# Patient Record
Sex: Female | Born: 1940
Health system: Southern US, Community
[De-identification: ages and names within clinical notes are randomized; demographics above are authoritative.]

## PROBLEM LIST (undated history)

## (undated) DIAGNOSIS — I739 Peripheral vascular disease, unspecified: Secondary | ICD-10-CM

## (undated) DIAGNOSIS — M2540 Effusion, unspecified joint: Secondary | ICD-10-CM

## (undated) DIAGNOSIS — M810 Age-related osteoporosis without current pathological fracture: Secondary | ICD-10-CM

## (undated) DIAGNOSIS — M549 Dorsalgia, unspecified: Secondary | ICD-10-CM

## (undated) DIAGNOSIS — R351 Nocturia: Secondary | ICD-10-CM

## (undated) DIAGNOSIS — M2550 Pain in unspecified joint: Secondary | ICD-10-CM

## (undated) DIAGNOSIS — I10 Essential (primary) hypertension: Secondary | ICD-10-CM

## (undated) HISTORY — PX: OTHER SURGICAL HISTORY: SHX169

## (undated) HISTORY — DX: Essential (primary) hypertension: I10

## (undated) HISTORY — PX: TRIGGER FINGER RELEASE: SHX641

---

## 2000-02-11 ENCOUNTER — Other Ambulatory Visit: Admission: RE | Admit: 2000-02-11 | Discharge: 2000-02-11 | Payer: Self-pay | Admitting: Orthopedic Surgery

## 2000-07-29 ENCOUNTER — Ambulatory Visit (HOSPITAL_COMMUNITY): Admission: RE | Admit: 2000-07-29 | Discharge: 2000-07-29 | Payer: Self-pay | Admitting: Internal Medicine

## 2000-07-29 ENCOUNTER — Encounter: Payer: Self-pay | Admitting: Internal Medicine

## 2001-02-03 ENCOUNTER — Ambulatory Visit (HOSPITAL_COMMUNITY): Admission: RE | Admit: 2001-02-03 | Discharge: 2001-02-03 | Payer: Self-pay | Admitting: Internal Medicine

## 2001-02-03 ENCOUNTER — Encounter: Payer: Self-pay | Admitting: Internal Medicine

## 2002-03-28 ENCOUNTER — Encounter: Payer: Self-pay | Admitting: Internal Medicine

## 2002-03-28 ENCOUNTER — Ambulatory Visit (HOSPITAL_COMMUNITY): Admission: RE | Admit: 2002-03-28 | Discharge: 2002-03-28 | Payer: Self-pay | Admitting: Internal Medicine

## 2002-09-28 ENCOUNTER — Encounter: Payer: Self-pay | Admitting: Emergency Medicine

## 2002-09-28 ENCOUNTER — Emergency Department (HOSPITAL_COMMUNITY): Admission: EM | Admit: 2002-09-28 | Discharge: 2002-09-28 | Payer: Self-pay | Admitting: Emergency Medicine

## 2003-04-21 ENCOUNTER — Ambulatory Visit (HOSPITAL_COMMUNITY): Admission: RE | Admit: 2003-04-21 | Discharge: 2003-04-21 | Payer: Self-pay | Admitting: Internal Medicine

## 2003-06-22 ENCOUNTER — Emergency Department (HOSPITAL_COMMUNITY): Admission: AD | Admit: 2003-06-22 | Discharge: 2003-06-22 | Payer: Self-pay | Admitting: Emergency Medicine

## 2003-06-22 ENCOUNTER — Emergency Department (HOSPITAL_COMMUNITY): Admission: EM | Admit: 2003-06-22 | Discharge: 2003-06-22 | Payer: Self-pay | Admitting: Emergency Medicine

## 2003-07-27 ENCOUNTER — Ambulatory Visit (HOSPITAL_COMMUNITY): Admission: RE | Admit: 2003-07-27 | Discharge: 2003-07-27 | Payer: Self-pay

## 2005-02-25 ENCOUNTER — Ambulatory Visit (HOSPITAL_COMMUNITY): Admission: RE | Admit: 2005-02-25 | Discharge: 2005-02-25 | Payer: Self-pay | Admitting: Internal Medicine

## 2005-04-28 ENCOUNTER — Ambulatory Visit (HOSPITAL_COMMUNITY): Admission: RE | Admit: 2005-04-28 | Discharge: 2005-04-28 | Payer: Self-pay | Admitting: Internal Medicine

## 2006-03-12 ENCOUNTER — Ambulatory Visit (HOSPITAL_COMMUNITY): Admission: RE | Admit: 2006-03-12 | Discharge: 2006-03-12 | Payer: Self-pay | Admitting: Internal Medicine

## 2006-07-21 ENCOUNTER — Ambulatory Visit: Payer: Self-pay | Admitting: Internal Medicine

## 2006-07-21 ENCOUNTER — Ambulatory Visit (HOSPITAL_COMMUNITY): Admission: RE | Admit: 2006-07-21 | Discharge: 2006-07-21 | Payer: Self-pay | Admitting: Internal Medicine

## 2007-03-18 ENCOUNTER — Ambulatory Visit (HOSPITAL_COMMUNITY): Admission: RE | Admit: 2007-03-18 | Discharge: 2007-03-18 | Payer: Self-pay | Admitting: Internal Medicine

## 2008-05-05 ENCOUNTER — Ambulatory Visit (HOSPITAL_COMMUNITY): Admission: RE | Admit: 2008-05-05 | Discharge: 2008-05-05 | Payer: Self-pay | Admitting: Internal Medicine

## 2009-05-07 ENCOUNTER — Ambulatory Visit (HOSPITAL_COMMUNITY): Admission: RE | Admit: 2009-05-07 | Discharge: 2009-05-07 | Payer: Self-pay | Admitting: Internal Medicine

## 2010-05-22 ENCOUNTER — Other Ambulatory Visit (HOSPITAL_COMMUNITY): Payer: Self-pay | Admitting: Internal Medicine

## 2010-05-22 DIAGNOSIS — Z139 Encounter for screening, unspecified: Secondary | ICD-10-CM

## 2010-05-30 ENCOUNTER — Ambulatory Visit (HOSPITAL_COMMUNITY)
Admission: RE | Admit: 2010-05-30 | Discharge: 2010-05-30 | Disposition: A | Discharge status: Home / Self Care | Payer: Medicare Other | Source: Ambulatory Visit | Attending: Internal Medicine | Admitting: Internal Medicine

## 2010-05-30 DIAGNOSIS — Z139 Encounter for screening, unspecified: Secondary | ICD-10-CM

## 2010-05-30 DIAGNOSIS — Z1231 Encounter for screening mammogram for malignant neoplasm of breast: Secondary | ICD-10-CM | POA: Insufficient documentation

## 2010-08-02 ENCOUNTER — Other Ambulatory Visit (HOSPITAL_COMMUNITY): Payer: Self-pay | Admitting: Internal Medicine

## 2010-08-05 ENCOUNTER — Ambulatory Visit (HOSPITAL_COMMUNITY)
Admission: RE | Admit: 2010-08-05 | Discharge: 2010-08-05 | Disposition: A | Discharge status: Home / Self Care | Payer: Medicare Other | Source: Ambulatory Visit | Attending: Internal Medicine | Admitting: Internal Medicine

## 2010-08-05 ENCOUNTER — Encounter (HOSPITAL_COMMUNITY): Payer: Self-pay

## 2010-08-05 DIAGNOSIS — M810 Age-related osteoporosis without current pathological fracture: Secondary | ICD-10-CM | POA: Insufficient documentation

## 2010-08-30 NOTE — Op Note (Signed)
NAME:  Paige Glass, Paige Glass                ACCOUNT NO.:  [id number]   MEDICAL RECORD NO.:  [id number]          PATIENT TYPE:  AMB   LOCATION:  DAY                           FACILITY:  [facility]   PHYSICIAN:  R. Roetta Sessions, M.D. DATE OF BIRTH:  01-14-1941   DATE OF PROCEDURE:  07/21/2006  DATE OF DISCHARGE:                               OPERATIVE REPORT   PROCEDURE:  Screening colonoscopy.   INDICATIONS FOR PROCEDURE:  The patient is a 70 year old lady, devoid of  any lower GI tract symptoms, sent over by Dr. Ouida Sills for colorectal  cancer screening.  She reports her last colonoscopy was in 1997.  Apparently, she had a sigmoidoscopy preceding that exam which suggested  polyps.  Reportedly, Dr. Lovell Sheehan do not find  any polyps.  There is no  family history of colorectal neoplasia.  Colonoscopy is now being done  as standard screening maneuver.  This approach has been discussed with  the patient at length and potential risks, benefits, and alternatives  have been reviewed. Questions have been answered.  She is agreeable.  Please see documentation in the medical record.   PROCEDURE NOTE:  O2 saturation, blood pressure, pulse, and respirations  were monitored throughout the entire procedure.   CONSCIOUS SEDATION:  Versed 3 mg IV and Demerol 50 mg IV in divided  doses.   INSTRUMENT:  Pentax video chip system.   FINDINGS:  Digital rectal exam revealed no abnormalities.   ENDOSCOPIC FINDINGS:  The prep was good.  Examination of the colonic  mucosa was undertaken from the rectosigmoid junction through the left  transverse, right colon, appendiceal orifice, ileocecal valve and cecum.  These structures were well seen and photographed for the record.  From  this level, the scope was slowly withdrawn.  All previously mentioned  mucosal surfaces were again seen.  The colonic mucosa appeared normal.  Scope was pulled down into the rectum.  A thorough examination of the  rectal mucosa was carried out  including  retroflexed view of the anal  verge which demonstrated no abnormalities.  The patient tolerated the  procedure well.   IMPRESSION:  1. Normal rectum.  2. Normal colon.   RECOMMENDATIONS:  Would consider another colonoscopy for screening  purposes in 10 years.      Jonathon Bellows, M.D.  Electronically Signed     RMR/MEDQ  D:  07/21/2006  T:  07/21/2006  Job:  [id number]   cc:   Kingsley Callander. Ouida Sills, MD  Fax: [phone]

## 2011-07-23 ENCOUNTER — Other Ambulatory Visit (HOSPITAL_COMMUNITY): Payer: Self-pay | Admitting: Internal Medicine

## 2011-07-23 DIAGNOSIS — Z139 Encounter for screening, unspecified: Secondary | ICD-10-CM

## 2011-07-28 ENCOUNTER — Ambulatory Visit (HOSPITAL_COMMUNITY)
Admission: RE | Admit: 2011-07-28 | Discharge: 2011-07-28 | Disposition: A | Discharge status: Home / Self Care | Payer: Medicare Other | Source: Ambulatory Visit | Attending: Internal Medicine | Admitting: Internal Medicine

## 2011-07-28 DIAGNOSIS — Z139 Encounter for screening, unspecified: Secondary | ICD-10-CM

## 2011-07-28 DIAGNOSIS — Z1231 Encounter for screening mammogram for malignant neoplasm of breast: Secondary | ICD-10-CM | POA: Insufficient documentation

## 2012-08-02 ENCOUNTER — Other Ambulatory Visit (HOSPITAL_COMMUNITY): Payer: Self-pay | Admitting: Internal Medicine

## 2012-08-02 DIAGNOSIS — Z139 Encounter for screening, unspecified: Secondary | ICD-10-CM

## 2012-08-12 ENCOUNTER — Ambulatory Visit (HOSPITAL_COMMUNITY)
Admission: RE | Admit: 2012-08-12 | Discharge: 2012-08-12 | Disposition: A | Discharge status: Home / Self Care | Payer: Medicare Other | Source: Ambulatory Visit | Attending: Internal Medicine | Admitting: Internal Medicine

## 2012-08-12 DIAGNOSIS — Z139 Encounter for screening, unspecified: Secondary | ICD-10-CM

## 2012-08-12 DIAGNOSIS — Z1231 Encounter for screening mammogram for malignant neoplasm of breast: Secondary | ICD-10-CM | POA: Insufficient documentation

## 2013-09-15 ENCOUNTER — Other Ambulatory Visit (HOSPITAL_COMMUNITY): Payer: Self-pay | Admitting: Internal Medicine

## 2013-09-15 DIAGNOSIS — Z1231 Encounter for screening mammogram for malignant neoplasm of breast: Secondary | ICD-10-CM

## 2013-09-20 ENCOUNTER — Ambulatory Visit (HOSPITAL_COMMUNITY)
Admission: RE | Admit: 2013-09-20 | Discharge: 2013-09-20 | Disposition: A | Discharge status: Home / Self Care | Payer: Medicare Other | Source: Ambulatory Visit | Attending: Internal Medicine | Admitting: Internal Medicine

## 2013-09-20 DIAGNOSIS — Z1231 Encounter for screening mammogram for malignant neoplasm of breast: Secondary | ICD-10-CM | POA: Insufficient documentation

## 2013-09-26 ENCOUNTER — Other Ambulatory Visit (HOSPITAL_COMMUNITY): Payer: Self-pay | Admitting: Internal Medicine

## 2013-09-26 DIAGNOSIS — M810 Age-related osteoporosis without current pathological fracture: Secondary | ICD-10-CM

## 2013-09-29 ENCOUNTER — Ambulatory Visit (HOSPITAL_COMMUNITY)
Admission: RE | Admit: 2013-09-29 | Discharge: 2013-09-29 | Disposition: A | Discharge status: Home / Self Care | Payer: Medicare Other | Source: Ambulatory Visit | Attending: Internal Medicine | Admitting: Internal Medicine

## 2013-09-29 DIAGNOSIS — M810 Age-related osteoporosis without current pathological fracture: Secondary | ICD-10-CM | POA: Insufficient documentation

## 2014-10-18 ENCOUNTER — Other Ambulatory Visit (HOSPITAL_COMMUNITY): Payer: Self-pay | Admitting: Internal Medicine

## 2014-10-18 DIAGNOSIS — Z1231 Encounter for screening mammogram for malignant neoplasm of breast: Secondary | ICD-10-CM

## 2014-10-25 ENCOUNTER — Ambulatory Visit (HOSPITAL_COMMUNITY)
Admission: RE | Admit: 2014-10-25 | Discharge: 2014-10-25 | Disposition: A | Discharge status: Home / Self Care | Payer: Medicare Other | Source: Ambulatory Visit | Attending: Internal Medicine | Admitting: Internal Medicine

## 2014-10-25 DIAGNOSIS — Z1231 Encounter for screening mammogram for malignant neoplasm of breast: Secondary | ICD-10-CM | POA: Diagnosis present

## 2015-10-19 ENCOUNTER — Other Ambulatory Visit (HOSPITAL_COMMUNITY): Payer: Self-pay | Admitting: Internal Medicine

## 2015-10-19 DIAGNOSIS — Z1231 Encounter for screening mammogram for malignant neoplasm of breast: Secondary | ICD-10-CM

## 2015-10-24 DIAGNOSIS — M810 Age-related osteoporosis without current pathological fracture: Secondary | ICD-10-CM | POA: Diagnosis not present

## 2015-10-24 DIAGNOSIS — Z79899 Other long term (current) drug therapy: Secondary | ICD-10-CM | POA: Diagnosis not present

## 2015-10-29 ENCOUNTER — Ambulatory Visit (HOSPITAL_COMMUNITY)
Admission: RE | Admit: 2015-10-29 | Discharge: 2015-10-29 | Disposition: A | Discharge status: Home / Self Care | Payer: PPO | Source: Ambulatory Visit | Attending: Internal Medicine | Admitting: Internal Medicine

## 2015-10-29 DIAGNOSIS — Z1231 Encounter for screening mammogram for malignant neoplasm of breast: Secondary | ICD-10-CM | POA: Insufficient documentation

## 2015-11-01 DIAGNOSIS — Z6824 Body mass index (BMI) 24.0-24.9, adult: Secondary | ICD-10-CM | POA: Diagnosis not present

## 2015-11-01 DIAGNOSIS — D45 Polycythemia vera: Secondary | ICD-10-CM | POA: Diagnosis not present

## 2015-11-01 DIAGNOSIS — M810 Age-related osteoporosis without current pathological fracture: Secondary | ICD-10-CM | POA: Diagnosis not present

## 2015-11-01 DIAGNOSIS — F17210 Nicotine dependence, cigarettes, uncomplicated: Secondary | ICD-10-CM | POA: Diagnosis not present

## 2015-11-06 ENCOUNTER — Other Ambulatory Visit (HOSPITAL_COMMUNITY): Payer: Self-pay | Admitting: Internal Medicine

## 2015-11-06 DIAGNOSIS — Z780 Asymptomatic menopausal state: Secondary | ICD-10-CM

## 2015-11-06 DIAGNOSIS — M810 Age-related osteoporosis without current pathological fracture: Secondary | ICD-10-CM

## 2015-11-12 ENCOUNTER — Ambulatory Visit (HOSPITAL_COMMUNITY)
Admission: RE | Admit: 2015-11-12 | Discharge: 2015-11-12 | Disposition: A | Discharge status: Home / Self Care | Payer: PPO | Source: Ambulatory Visit | Attending: Internal Medicine | Admitting: Internal Medicine

## 2015-11-12 DIAGNOSIS — M810 Age-related osteoporosis without current pathological fracture: Secondary | ICD-10-CM | POA: Diagnosis not present

## 2015-11-12 DIAGNOSIS — Z780 Asymptomatic menopausal state: Secondary | ICD-10-CM

## 2016-06-17 ENCOUNTER — Encounter: Payer: Self-pay | Admitting: Internal Medicine

## 2016-07-13 ENCOUNTER — Ambulatory Visit (HOSPITAL_COMMUNITY)
Admission: EM | Admit: 2016-07-13 | Discharge: 2016-07-13 | Disposition: A | Discharge status: Home / Self Care | Payer: PPO | Attending: Family Medicine | Admitting: Family Medicine

## 2016-07-13 ENCOUNTER — Ambulatory Visit (INDEPENDENT_AMBULATORY_CARE_PROVIDER_SITE_OTHER): Payer: PPO

## 2016-07-13 ENCOUNTER — Encounter (HOSPITAL_COMMUNITY): Payer: Self-pay | Admitting: Emergency Medicine

## 2016-07-13 DIAGNOSIS — S92001A Unspecified fracture of right calcaneus, initial encounter for closed fracture: Secondary | ICD-10-CM

## 2016-07-13 DIAGNOSIS — S92002A Unspecified fracture of left calcaneus, initial encounter for closed fracture: Secondary | ICD-10-CM | POA: Diagnosis not present

## 2016-07-13 MED ORDER — HYDROCODONE-ACETAMINOPHEN 5-325 MG PO TABS: 1.0000 | ORAL_TABLET | Freq: Four times a day (QID) | ORAL | 0 refills | Status: DC | PRN

## 2016-07-13 NOTE — Progress Notes (Signed)
Orthopedic Tech Progress Note Patient Details:  Alishah Schulte Spanos Apr 11, 1941 [id number]  Ortho Devices Type of Ortho Device: Ace wrap, Stirrup splint Ortho Device/Splint Location: LLE Ortho Device/Splint Interventions: Ordered, Application   Braulio Bosch 07/13/2016, 8:48 PM

## 2016-07-13 NOTE — ED Triage Notes (Signed)
PT thought she left something hot on so she was rushing down the stairs and fell down two stairs. PT fell onto cement floor. PT did not hit her head. Pain is located behind left heel/ankle. No other injuries. Pt fell at 0745 this morning

## 2016-07-13 NOTE — ED Provider Notes (Addendum)
Paige Glass    CSN: [id number] Arrival date & time: 07/13/16  Paige Glass     History   Chief Complaint Chief Complaint  Patient presents with  . Fall    HPI Paige Glass is a 76 y.o. female.   This a 76 year old woman that fell down this morning when she was walking down the stairs. She struck her ankle and has had swelling and pain since. She is brought to the emergency room by her daughter and granddaughter.      History reviewed. No pertinent past medical history.  There are no active problems to display for this patient.   History reviewed. No pertinent surgical history.  OB History    No data available       Home Medications    Prior to Admission medications   Not on File    Family History No family history on file.  Social History Social History  Substance Use Topics  . Smoking status: Current Every Day Smoker    Packs/day: 0.50    Types: Cigarettes  . Smokeless tobacco: Never Used  . Alcohol use No     Allergies   Patient has no known allergies.   Review of Systems Review of Systems  Constitutional: Negative.   Musculoskeletal: Positive for gait problem and joint swelling.     Physical Exam Triage Vital Signs ED Triage Vitals  Enc Vitals Group     BP 07/13/16 1927 (!) 115/57     Pulse Rate 07/13/16 1927 69     Resp 07/13/16 1927 16     Temp 07/13/16 1927 98.5 F (36.9 C)     Temp Source 07/13/16 1927 Oral     SpO2 07/13/16 1927 97 %     Weight 07/13/16 1928 113 lb (51.3 kg)     Height 07/13/16 1928 4\' 11"  (1.499 m)     Head Circumference --      Peak Flow --      Pain Score 07/13/16 1928 8     Pain Loc --      Pain Edu? --      Excl. in [place]? --    No data found.   Updated Vital Signs BP (!) 115/57   Pulse 69   Temp 98.5 F (36.9 C) (Oral)   Resp 16   Ht 4\' 11"  (1.499 m)   Wt 113 lb (51.3 kg)   SpO2 97%   BMI 22.82 kg/m   Visual Acuity Right Eye Distance:   Left Eye Distance:   Bilateral Distance:     Right Eye Near:   Left Eye Near:    Bilateral Near:     Physical Exam  Constitutional: She is oriented to person, place, and time. She appears well-developed and well-nourished.  HENT:  Right Ear: External ear normal.  Left Ear: External ear normal.  Mouth/Throat: Oropharynx is clear and moist.  Eyes: Conjunctivae are normal. Pupils are equal, round, and reactive to light.  Neck: Normal range of motion. Neck supple.  Pulmonary/Chest: Effort normal.  Musculoskeletal:  Left foot is quite swollen posteriorly and tender over the os calcis. She has no tenderness over the malleolar line. Range of motion was not tested.  Neurological: She is alert and oriented to person, place, and time.  Skin: Skin is warm and dry.  Nursing note and vitals reviewed.    UC Treatments / Results  Labs (all labs ordered are listed, but only abnormal results are displayed) Labs Reviewed - No  data to display  EKG  EKG Interpretation None       Radiology Dg Ankle Complete Left  Result Date: 07/13/2016 CLINICAL DATA:  [place] down the steps landing on LEFT ankle, pains, swelling, and bruising diffusely EXAM: LEFT ANKLE COMPLETE - 3+ VIEW COMPARISON:  None FINDINGS: Soft tissue swelling diffusely. Osseous demineralization. Ankle mortise intact. Large avulsion fracture of the superior posterior calcaneus including the Achilles insertion with cranial retraction of the fragment approximately 2 cm. Small plantar calcaneal spur. No additional fracture or dislocation. IMPRESSION: Large avulsion fracture of of the superior aspect of the posterior LEFT calcaneus with superior retraction of the fragment. Electronically Signed   By: Lavonia Dana M.D.   On: 07/13/2016 20:40    Procedures Procedures (including critical care time)  Medications Ordered in UC Medications - No data to display   Initial Impression / Assessment and Plan / UC Course  I have reviewed the triage vital signs and the nursing  notes.  Pertinent labs & imaging results that were available during my care of the patient were reviewed by me and considered in my medical decision making (see chart for details).   patient was seen and evaluated in the urgent care setting by Dr. Alphonzo Severance who will plan surgical intervention for Monday, April 2   Final Clinical Impressions(s) / UC Diagnoses   Final diagnoses:  Closed displaced fracture of right calcaneus, unspecified portion of calcaneus, initial encounter    New Prescriptions New Prescriptions   No medications on file  Orthopedic splint applied and patient left with instruction to stay in wheelchair, nonweightbearing  with leg elevated when she gets home. Do not put any weight on this   Robyn Haber, MD 07/13/16 2046    Robyn Haber, MD 07/14/16 [phone]

## 2016-07-13 NOTE — Discharge Instructions (Signed)
Do not put any weight on your left leg. Keep the left leg elevated as much as possible. Dr. Randel Pigg office will call you tomorrow for the time for surgery. This will be done at [facility].  Please do not eat any thing or drink anything after midnight tonight so that anesthesia will be safe for surgery tomorrow

## 2016-07-13 NOTE — H&P (Signed)
Paige Glass is an 76 y.o. female.   Chief Complaint: Left ankle pain HPI: Paige Glass is an active 76 year old patient with left ankle pain.  She came down the stairs this morning is been unable to walk since that time.  She is very active and uses no assistive devices getting around.  She denies any other orthopedic complaints but does report significant posterior heel pain.  This is on the left-hand side.  No past medical history on file.  No past surgical history on file.  No family history on file. Social History:  reports that she has been smoking Cigarettes.  She has been smoking about 0.50 packs per day. She has never used smokeless tobacco. She reports that she does not drink alcohol. Her drug history is not on file.  Allergies: No Known Allergies  No prescriptions prior to admission.    No results found for this or any previous visit (from the past 48 hour(s)). Dg Ankle Complete Left  Result Date: 07/13/2016 CLINICAL DATA:  Golden Circle down the steps landing on LEFT ankle, pains, swelling, and bruising diffusely EXAM: LEFT ANKLE COMPLETE - 3+ VIEW COMPARISON:  None FINDINGS: Soft tissue swelling diffusely. Osseous demineralization. Ankle mortise intact. Large avulsion fracture of the superior posterior calcaneus including the Achilles insertion with cranial retraction of the fragment approximately 2 cm. Small plantar calcaneal spur. No additional fracture or dislocation. IMPRESSION: Large avulsion fracture of of the superior aspect of the posterior LEFT calcaneus with superior retraction of the fragment. Electronically Signed   By: Lavonia Dana M.D.   On: 07/13/2016 20:40    Review of Systems  Musculoskeletal: Positive for joint pain.  All other systems reviewed and are negative.   There were no vitals taken for this visit. Physical Exam  Constitutional: She appears well-developed.  HENT:  Head: Normocephalic.  Eyes: Pupils are equal, round, and reactive to light.  Neck: Normal range  of motion.  Cardiovascular: Normal rate.   Respiratory: Effort normal.  Neurological: She is alert.  Skin: Skin is warm.  Psychiatric: She has a normal mood and affect.  Examination of the left foot demonstrates palpable pedal pulses.  Some swelling around the ankle region.  Departments are soft.  There is no tenting of the skin but there is swelling present.  Sensation intact on the dorsal and plantar aspect of the left foot   Assessment/Plan Impression is avulsion fracture of the posterior aspect of the calcaneus.  There is no imminent skin breakdown.  However the fragment is retracted proximally and I would favor operative fixation in the near future to prevent skin problems.  Patient is placed in a stirrup splint only which does not place any plaster on the posterior aspect of the ankle or calcaneus.  We will maintain the plantar flexion to decrease tension on that skin.  Risk and benefits of surgical intervention discussed with the patient including but not limited to infection or vessel damage as well as a prolonged period of nonweightbearing to allow for healing.  Patient understands the risk and benefits.  All questions answered.  Plan for surgery tomorrow.  Need to elevate the leg is much as possible to light.  There is no family history or personal history of deep vein thrombosis or pulmonary embolism.  Anderson Malta, MD 07/13/2016, 10:25 PM

## 2016-07-14 ENCOUNTER — Ambulatory Visit (HOSPITAL_COMMUNITY): Payer: PPO

## 2016-07-14 ENCOUNTER — Ambulatory Visit (HOSPITAL_COMMUNITY): Payer: PPO | Admitting: Anesthesiology

## 2016-07-14 ENCOUNTER — Ambulatory Visit (HOSPITAL_COMMUNITY)
Admission: RE | Admit: 2016-07-14 | Discharge: 2016-07-14 | Disposition: A | Discharge status: Home / Self Care | Payer: PPO | Source: Ambulatory Visit | Attending: Orthopedic Surgery | Admitting: Orthopedic Surgery

## 2016-07-14 ENCOUNTER — Encounter (HOSPITAL_COMMUNITY)
Admission: RE | Disposition: A | Discharge status: Home / Self Care | Payer: Self-pay | Source: Ambulatory Visit | Attending: Orthopedic Surgery

## 2016-07-14 ENCOUNTER — Encounter (HOSPITAL_COMMUNITY): Payer: Self-pay | Admitting: *Deleted

## 2016-07-14 DIAGNOSIS — M7732 Calcaneal spur, left foot: Secondary | ICD-10-CM | POA: Insufficient documentation

## 2016-07-14 DIAGNOSIS — S92032A Displaced avulsion fracture of tuberosity of left calcaneus, initial encounter for closed fracture: Secondary | ICD-10-CM | POA: Insufficient documentation

## 2016-07-14 DIAGNOSIS — Y9389 Activity, other specified: Secondary | ICD-10-CM | POA: Diagnosis not present

## 2016-07-14 DIAGNOSIS — Z419 Encounter for procedure for purposes other than remedying health state, unspecified: Secondary | ICD-10-CM

## 2016-07-14 DIAGNOSIS — F17210 Nicotine dependence, cigarettes, uncomplicated: Secondary | ICD-10-CM | POA: Diagnosis not present

## 2016-07-14 DIAGNOSIS — G8918 Other acute postprocedural pain: Secondary | ICD-10-CM | POA: Diagnosis not present

## 2016-07-14 DIAGNOSIS — W108XXA Fall (on) (from) other stairs and steps, initial encounter: Secondary | ICD-10-CM | POA: Insufficient documentation

## 2016-07-14 DIAGNOSIS — S99002A Unspecified physeal fracture of left calcaneus, initial encounter for closed fracture: Secondary | ICD-10-CM | POA: Insufficient documentation

## 2016-07-14 DIAGNOSIS — S92022A Displaced fracture of anterior process of left calcaneus, initial encounter for closed fracture: Secondary | ICD-10-CM | POA: Diagnosis not present

## 2016-07-14 DIAGNOSIS — Y92008 Other place in unspecified non-institutional (private) residence as the place of occurrence of the external cause: Secondary | ICD-10-CM | POA: Diagnosis not present

## 2016-07-14 DIAGNOSIS — S92001A Unspecified fracture of right calcaneus, initial encounter for closed fracture: Secondary | ICD-10-CM | POA: Diagnosis not present

## 2016-07-14 DIAGNOSIS — S92002A Unspecified fracture of left calcaneus, initial encounter for closed fracture: Secondary | ICD-10-CM | POA: Diagnosis not present

## 2016-07-14 HISTORY — DX: Nocturia: R35.1

## 2016-07-14 HISTORY — DX: Pain in unspecified joint: M25.50

## 2016-07-14 HISTORY — PX: OPEN REDUCTION INTERNAL FIXATION (ORIF) FOOT LISFRANC FRACTURE: SHX5990

## 2016-07-14 HISTORY — DX: Dorsalgia, unspecified: M54.9

## 2016-07-14 HISTORY — DX: Age-related osteoporosis without current pathological fracture: M81.0

## 2016-07-14 HISTORY — DX: Effusion, unspecified joint: M25.40

## 2016-07-14 LAB — BASIC METABOLIC PANEL
Anion gap: 8 (ref 5–15)
BUN: 13 mg/dL (ref 6–20)
CHLORIDE: 109 mmol/L (ref 101–111)
CO2: 26 mmol/L (ref 22–32)
Calcium: 8.7 mg/dL — ABNORMAL LOW (ref 8.9–10.3)
Creatinine, Ser: 0.92 mg/dL (ref 0.44–1.00)
GFR calc non Af Amer: 60 mL/min — ABNORMAL LOW (ref >60)
Glucose, Bld: 88 mg/dL (ref 65–99)
POTASSIUM: 4 mmol/L (ref 3.5–5.1)
SODIUM: 143 mmol/L (ref 135–145)

## 2016-07-14 LAB — CBC
HEMATOCRIT: 39.8 % (ref 36.0–46.0)
HEMOGLOBIN: 13.4 g/dL (ref 12.0–15.0)
MCH: 32.7 pg (ref 26.0–34.0)
MCHC: 33.7 g/dL (ref 30.0–36.0)
MCV: 97.1 fL (ref 78.0–100.0)
Platelets: 224 10*3/uL (ref 150–400)
RBC: 4.1 MIL/uL (ref 3.87–5.11)
RDW: 11.9 % (ref 11.5–15.5)
WBC: 8.4 10*3/uL (ref 4.0–10.5)

## 2016-07-14 LAB — SURGICAL PCR SCREEN
MRSA, PCR: NEGATIVE
STAPHYLOCOCCUS AUREUS: POSITIVE — AB

## 2016-07-14 SURGERY — OPEN REDUCTION INTERNAL FIXATION (ORIF) FOOT LISFRANC FRACTURE
Anesthesia: General | Site: Foot | Laterality: Left

## 2016-07-14 MED ORDER — FENTANYL CITRATE (PF) 100 MCG/2ML IJ SOLN
INTRAMUSCULAR | Status: AC
  Administered 2016-07-14: 50 ug via INTRAVENOUS
  Filled 2016-07-14: qty 2

## 2016-07-14 MED ORDER — MIDAZOLAM HCL 2 MG/2ML IJ SOLN
2.0000 mg | Freq: Once | INTRAMUSCULAR | Status: AC
  Administered 2016-07-14: 2 mg via INTRAVENOUS

## 2016-07-14 MED ORDER — PHENYLEPHRINE 40 MCG/ML (10ML) SYRINGE FOR IV PUSH (FOR BLOOD PRESSURE SUPPORT)
PREFILLED_SYRINGE | INTRAVENOUS | Status: AC
  Filled 2016-07-14: qty 10

## 2016-07-14 MED ORDER — PROPOFOL 10 MG/ML IV BOLUS
INTRAVENOUS | Status: DC | PRN
  Administered 2016-07-14: 50 mg via INTRAVENOUS
  Administered 2016-07-14: 80 mg via INTRAVENOUS

## 2016-07-14 MED ORDER — SUGAMMADEX SODIUM 200 MG/2ML IV SOLN
INTRAVENOUS | Status: AC
  Filled 2016-07-14: qty 2

## 2016-07-14 MED ORDER — CEFAZOLIN SODIUM-DEXTROSE 2-4 GM/100ML-% IV SOLN
INTRAVENOUS | Status: AC
  Filled 2016-07-14: qty 100

## 2016-07-14 MED ORDER — PROPOFOL 10 MG/ML IV BOLUS
INTRAVENOUS | Status: AC
  Filled 2016-07-14: qty 20

## 2016-07-14 MED ORDER — MIDAZOLAM HCL 2 MG/2ML IJ SOLN
INTRAMUSCULAR | Status: AC
  Administered 2016-07-14: 2 mg via INTRAVENOUS
  Filled 2016-07-14: qty 2

## 2016-07-14 MED ORDER — ROCURONIUM BROMIDE 50 MG/5ML IV SOSY
PREFILLED_SYRINGE | INTRAVENOUS | Status: AC
  Filled 2016-07-14: qty 5

## 2016-07-14 MED ORDER — EPHEDRINE SULFATE 50 MG/ML IJ SOLN
INTRAMUSCULAR | Status: DC | PRN
  Administered 2016-07-14: 10 mg via INTRAVENOUS

## 2016-07-14 MED ORDER — PHENYLEPHRINE HCL 10 MG/ML IJ SOLN
INTRAVENOUS | Status: DC | PRN
  Administered 2016-07-14: 30 ug/min via INTRAVENOUS

## 2016-07-14 MED ORDER — ARTIFICIAL TEARS OP OINT
TOPICAL_OINTMENT | OPHTHALMIC | Status: DC | PRN
  Administered 2016-07-14: 1 via OPHTHALMIC

## 2016-07-14 MED ORDER — DEXAMETHASONE SODIUM PHOSPHATE 10 MG/ML IJ SOLN
INTRAMUSCULAR | Status: AC
  Filled 2016-07-14: qty 1

## 2016-07-14 MED ORDER — LIDOCAINE HCL (CARDIAC) 20 MG/ML IV SOLN
INTRAVENOUS | Status: DC | PRN
  Administered 2016-07-14: 100 mg via INTRAVENOUS

## 2016-07-14 MED ORDER — LIDOCAINE 2% (20 MG/ML) 5 ML SYRINGE
INTRAMUSCULAR | Status: AC
  Filled 2016-07-14: qty 5

## 2016-07-14 MED ORDER — MIDAZOLAM HCL 2 MG/2ML IJ SOLN
INTRAMUSCULAR | Status: AC
  Filled 2016-07-14: qty 2

## 2016-07-14 MED ORDER — BUPIVACAINE-EPINEPHRINE (PF) 0.5% -1:200000 IJ SOLN
INTRAMUSCULAR | Status: DC | PRN
  Administered 2016-07-14: 30 mL via PERINEURAL

## 2016-07-14 MED ORDER — SUGAMMADEX SODIUM 200 MG/2ML IV SOLN
INTRAVENOUS | Status: DC | PRN
  Administered 2016-07-14: 125 mg via INTRAVENOUS

## 2016-07-14 MED ORDER — FENTANYL CITRATE (PF) 100 MCG/2ML IJ SOLN
INTRAMUSCULAR | Status: DC | PRN
  Administered 2016-07-14: 50 ug via INTRAVENOUS

## 2016-07-14 MED ORDER — DEXAMETHASONE SODIUM PHOSPHATE 10 MG/ML IJ SOLN
INTRAMUSCULAR | Status: DC | PRN
  Administered 2016-07-14: 10 mg via INTRAVENOUS

## 2016-07-14 MED ORDER — 0.9 % SODIUM CHLORIDE (POUR BTL) OPTIME
TOPICAL | Status: DC | PRN
  Administered 2016-07-14: 1000 mL

## 2016-07-14 MED ORDER — CHLORHEXIDINE GLUCONATE 4 % EX LIQD: 60.0000 mL | Freq: Once | CUTANEOUS | Status: DC

## 2016-07-14 MED ORDER — CEFAZOLIN SODIUM-DEXTROSE 2-4 GM/100ML-% IV SOLN
2.0000 g | INTRAVENOUS | Status: AC
  Administered 2016-07-14: 2 g via INTRAVENOUS

## 2016-07-14 MED ORDER — LACTATED RINGERS IV SOLN
INTRAVENOUS | Status: DC
  Administered 2016-07-14 (×2): via INTRAVENOUS

## 2016-07-14 MED ORDER — FENTANYL CITRATE (PF) 250 MCG/5ML IJ SOLN
INTRAMUSCULAR | Status: AC
  Filled 2016-07-14: qty 5

## 2016-07-14 MED ORDER — PHENYLEPHRINE HCL 10 MG/ML IJ SOLN
INTRAMUSCULAR | Status: DC | PRN
  Administered 2016-07-14: 80 ug via INTRAVENOUS
  Administered 2016-07-14 (×2): 40 ug via INTRAVENOUS

## 2016-07-14 MED ORDER — FENTANYL CITRATE (PF) 100 MCG/2ML IJ SOLN
50.0000 ug | Freq: Once | INTRAMUSCULAR | Status: AC
  Administered 2016-07-14: 50 ug via INTRAVENOUS

## 2016-07-14 MED ORDER — LIDOCAINE-EPINEPHRINE (PF) 1.5 %-1:200000 IJ SOLN
INTRAMUSCULAR | Status: DC | PRN
  Administered 2016-07-14: 30 mL via PERINEURAL

## 2016-07-14 MED ORDER — ROCURONIUM BROMIDE 100 MG/10ML IV SOLN
INTRAVENOUS | Status: DC | PRN
  Administered 2016-07-14: 50 mg via INTRAVENOUS

## 2016-07-14 MED ORDER — ONDANSETRON HCL 4 MG/2ML IJ SOLN
INTRAMUSCULAR | Status: AC
  Filled 2016-07-14: qty 2

## 2016-07-14 MED ORDER — ONDANSETRON HCL 4 MG/2ML IJ SOLN
INTRAMUSCULAR | Status: DC | PRN
  Administered 2016-07-14: 4 mg via INTRAVENOUS

## 2016-07-14 SURGICAL SUPPLY — 69 items
BANDAGE ACE 4X5 VEL STRL LF (GAUZE/BANDAGES/DRESSINGS) ×3 IMPLANT
BANDAGE ACE 6X5 VEL STRL LF (GAUZE/BANDAGES/DRESSINGS) ×3 IMPLANT
BIT DRILL CANN 3.5MM (DRILL) ×1 IMPLANT
BLADE SURG 10 STRL SS (BLADE) ×3 IMPLANT
BNDG COHESIVE 6X5 TAN STRL LF (GAUZE/BANDAGES/DRESSINGS) ×3 IMPLANT
BNDG ELASTIC 6X10 VLCR STRL LF (GAUZE/BANDAGES/DRESSINGS) ×3 IMPLANT
BNDG ESMARK 4X9 LF (GAUZE/BANDAGES/DRESSINGS) ×3 IMPLANT
BNDG GAUZE ELAST 4 BULKY (GAUZE/BANDAGES/DRESSINGS) ×3 IMPLANT
COVER MAYO STAND STRL (DRAPES) IMPLANT
COVER SURGICAL LIGHT HANDLE (MISCELLANEOUS) ×3 IMPLANT
CUFF TOURNIQUET SINGLE 34IN LL (TOURNIQUET CUFF) IMPLANT
CUFF TOURNIQUET SINGLE 44IN (TOURNIQUET CUFF) IMPLANT
DRAPE C-ARM 42X72 X-RAY (DRAPES) ×3 IMPLANT
DRAPE C-ARMOR (DRAPES) ×3 IMPLANT
DRAPE INCISE IOBAN 66X45 STRL (DRAPES) ×3 IMPLANT
DRAPE SURG 17X23 STRL (DRAPES) ×3 IMPLANT
DRAPE U-SHAPE 47X51 STRL (DRAPES) ×3 IMPLANT
DRILL CANN 3.5MM (DRILL) ×3
DRSG AQUACEL AG ADV 3.5X 6 (GAUZE/BANDAGES/DRESSINGS) ×3 IMPLANT
DRSG PAD ABDOMINAL 8X10 ST (GAUZE/BANDAGES/DRESSINGS) ×3 IMPLANT
DURAPREP 26ML APPLICATOR (WOUND CARE) ×3 IMPLANT
ELECT CAUTERY BLADE 6.4 (BLADE) ×3 IMPLANT
ELECT REM PT RETURN 9FT ADLT (ELECTROSURGICAL) ×3
ELECTRODE REM PT RTRN 9FT ADLT (ELECTROSURGICAL) ×1 IMPLANT
GAUZE SPONGE 4X4 12PLY STRL (GAUZE/BANDAGES/DRESSINGS) ×3 IMPLANT
GAUZE SPONGE 4X4 12PLY STRL LF (GAUZE/BANDAGES/DRESSINGS) ×3 IMPLANT
GAUZE XEROFORM 5X9 LF (GAUZE/BANDAGES/DRESSINGS) ×3 IMPLANT
GLOVE BIOGEL PI IND STRL 7.5 (GLOVE) ×2 IMPLANT
GLOVE BIOGEL PI IND STRL 8 (GLOVE) ×1 IMPLANT
GLOVE BIOGEL PI INDICATOR 7.5 (GLOVE) ×4
GLOVE BIOGEL PI INDICATOR 8 (GLOVE) ×2
GLOVE ECLIPSE 7.0 STRL STRAW (GLOVE) ×3 IMPLANT
GLOVE SURG ORTHO 8.0 STRL STRW (GLOVE) ×3 IMPLANT
GLOVE SURG SS PI 6.5 STRL IVOR (GLOVE) ×3 IMPLANT
GOWN STRL REUS W/ TWL LRG LVL3 (GOWN DISPOSABLE) ×3 IMPLANT
GOWN STRL REUS W/TWL LRG LVL3 (GOWN DISPOSABLE) ×6
HANDPIECE INTERPULSE COAX TIP (DISPOSABLE)
K-WIRE 1.8 (WIRE) ×6
K-WIRE FX200X1.8XTROC TIP (WIRE) ×3
KIT BASIN OR (CUSTOM PROCEDURE TRAY) ×3 IMPLANT
KIT ROOM TURNOVER OR (KITS) ×3 IMPLANT
KWIRE FX200X1.8XTROC TIP (WIRE) ×3 IMPLANT
MANIFOLD NEPTUNE II (INSTRUMENTS) ×3 IMPLANT
NEEDLE HYPO 25GX1X1/2 BEV (NEEDLE) ×3 IMPLANT
NS IRRIG 1000ML POUR BTL (IV SOLUTION) ×3 IMPLANT
PACK ORTHO EXTREMITY (CUSTOM PROCEDURE TRAY) ×3 IMPLANT
PAD ARMBOARD 7.5X6 YLW CONV (MISCELLANEOUS) ×6 IMPLANT
PAD CAST 4YDX4 CTTN HI CHSV (CAST SUPPLIES) ×1 IMPLANT
PADDING CAST COTTON 4X4 STRL (CAST SUPPLIES) ×2
PADDING CAST COTTON 6X4 STRL (CAST SUPPLIES) ×3 IMPLANT
SCREW CANNULATED 5.0X44MM (Screw) ×3 IMPLANT
SCREW P/T IMPL CANN 5.0X36 (Screw) ×3 IMPLANT
SET HNDPC FAN SPRY TIP SCT (DISPOSABLE) IMPLANT
STOCKINETTE IMPERVIOUS 9X36 MD (GAUZE/BANDAGES/DRESSINGS) ×3 IMPLANT
SUCTION FRAZIER HANDLE 10FR (MISCELLANEOUS) ×2
SUCTION FRAZIER TIP 10 FR DISP (SUCTIONS) ×3 IMPLANT
SUCTION TUBE FRAZIER 10FR DISP (MISCELLANEOUS) ×1 IMPLANT
SUT ETHILON 3 0 PS 1 (SUTURE) IMPLANT
SUT MNCRL AB 3-0 PS2 18 (SUTURE) IMPLANT
SUT VIC AB 2-0 CTB1 (SUTURE) ×3 IMPLANT
SUT VIC AB 3-0 SH 27 (SUTURE) ×2
SUT VIC AB 3-0 SH 27X BRD (SUTURE) ×1 IMPLANT
SYR CONTROL 10ML LL (SYRINGE) ×3 IMPLANT
TOWEL OR 17X24 6PK STRL BLUE (TOWEL DISPOSABLE) ×3 IMPLANT
TOWEL OR 17X26 10 PK STRL BLUE (TOWEL DISPOSABLE) ×3 IMPLANT
TUBE CONNECTING 12'X1/4 (SUCTIONS) ×1
TUBE CONNECTING 12X1/4 (SUCTIONS) ×2 IMPLANT
WATER STERILE IRR 1000ML POUR (IV SOLUTION) ×3 IMPLANT
YANKAUER SUCT BULB TIP NO VENT (SUCTIONS) ×3 IMPLANT

## 2016-07-14 NOTE — Anesthesia Preprocedure Evaluation (Addendum)
Anesthesia Evaluation  Patient identified by MRN, date of birth, ID band Patient awake    Reviewed: Allergy & Precautions, NPO status , Patient's Chart, lab work & pertinent test results  History of Anesthesia Complications Negative for: history of anesthetic complications  Airway Mallampati: II  TM Distance: >3 FB Neck ROM: Full    Dental  (+) Teeth Intact, Dental Advisory Given   Pulmonary Current Smoker,    Pulmonary exam normal breath sounds clear to auscultation       Cardiovascular negative cardio ROS Normal cardiovascular exam Rhythm:Regular Rate:Normal     Neuro/Psych    GI/Hepatic negative GI ROS, Neg liver ROS,   Endo/Other  negative endocrine ROS  Renal/GU negative Renal ROS  negative genitourinary   Musculoskeletal   Abdominal   Peds negative pediatric ROS (+)  Hematology negative hematology ROS (+)   Anesthesia Other Findings   Reproductive/Obstetrics                            Anesthesia Physical Anesthesia Plan  ASA: II  Anesthesia Plan: General   Post-op Pain Management:  Regional for Post-op pain   Induction: Intravenous  Airway Management Planned: LMA  Additional Equipment:   Intra-op Plan:   Post-operative Plan: Extubation in OR  Informed Consent: I have reviewed the patients History and Physical, chart, labs and discussed the procedure including the risks, benefits and alternatives for the proposed anesthesia with the patient or authorized representative who has indicated his/her understanding and acceptance.     Plan Discussed with: CRNA and Surgeon  Anesthesia Plan Comments:         Anesthesia Quick Evaluation

## 2016-07-14 NOTE — Anesthesia Procedure Notes (Signed)
Procedure Name: Intubation Date/Time: 07/14/2016 1:05 PM Performed by: Jenne Campus Pre-anesthesia Checklist: Patient identified, Emergency Drugs available, Suction available and Patient being monitored Patient Re-evaluated:Patient Re-evaluated prior to inductionOxygen Delivery Method: Circle System Utilized Preoxygenation: Pre-oxygenation with 100% oxygen Intubation Type: IV induction Ventilation: Mask ventilation without difficulty Laryngoscope Size: Miller and 2 Grade View: Grade I Tube type: Oral Tube size: 7.0 mm Number of attempts: 1 Airway Equipment and Method: Stylet and Oral airway Placement Confirmation: ETT inserted through vocal cords under direct vision,  positive ETCO2 and breath sounds checked- equal and bilateral Secured at: 21 cm Tube secured with: Tape Dental Injury: Teeth and Oropharynx as per pre-operative assessment

## 2016-07-14 NOTE — Brief Op Note (Signed)
07/14/2016  2:46 PM  PATIENT:  Paige Glass  76 y.o. female  PRE-OPERATIVE DIAGNOSIS:  Calcanial Avulsion Fracture  POST-OPERATIVE DIAGNOSIS:  [place] Avulsion Fracture  PROCEDURE:  Procedure(s): OPEN REDUCTION INTERNAL FIXATION (ORIF) FOOT LISFRANC FRACTURE  SURGEON:  Surgeon(s): Meredith Pel, MD  ASSISTANT: April Green RNFA  ANESTHESIA:   general  EBL:15 Total I/O In: 1300 [I.V.:1300] Out: 15 [Blood:15]  BLOOD ADMINISTERED: none  DRAINS: none   LOCAL MEDICATIONS USED:  none  SPECIMEN:  No Specimen  COUNTS:  YES  TOURNIQUET:  * Missing tourniquet times found for documented tourniquets in log:  [id number] *  DICTATION: .Other Dictation: Dictation Number [id number]  PLAN OF CARE: Discharge to home after PACU  PATIENT DISPOSITION:  PACU - hemodynamically stable

## 2016-07-14 NOTE — Anesthesia Procedure Notes (Signed)
Anesthesia Regional Block: Popliteal block   Pre-Anesthetic Checklist: ,, timeout performed, Correct Patient, Correct Site, Correct Laterality, Correct Procedure, Correct Position, site marked, Risks and benefits discussed,  Surgical consent,  Pre-op evaluation,  At surgeon's request and post-op pain management  Laterality: Left  Prep: chloraprep       Needles:  Injection technique: Single-shot  Needle Type: Stimulator Needle - 80     Needle Length: 10cm  Needle Gauge: 21     Additional Needles:   Procedures: ultrasound guided, nerve stimulator,,,,,,   Nerve Stimulator or Paresthesia:  Response: 0.4 mA,   Additional Responses:   Narrative:  Start time: 07/14/2016 11:55 AM End time: 07/14/2016 12:10 PM Injection made incrementally with aspirations every 5 mL.  Performed by: Personally  Anesthesiologist: Lillia Abed  Additional Notes: Monitors applied. Patient sedated. Sterile prep and drape,hand hygiene and sterile gloves were used. Relevant anatomy identified.Needle position confirmed.Local anesthetic injected incrementally after negative aspiration. Local anesthetic spread visualized around nerve(s). Vascular puncture avoided. No complications. Image printed for medical record.The patient tolerated the procedure well.  Additional Saphenous nerve block performed. 15cc Local Anesthetic mixture placed under ultrasonic guidance along the medio-inferior border of the Sartorious muscle 6 inches above the knee.  No Problems encountered.  Lillia Abed MD

## 2016-07-14 NOTE — Anesthesia Postprocedure Evaluation (Signed)
Anesthesia Post Note  Patient: Paige Glass  Procedure(s) Performed: Procedure(s) (LRB): OPEN REDUCTION INTERNAL FIXATION (ORIF) FOOT LISFRANC FRACTURE (Left)  Patient location during evaluation: PACU Anesthesia Type: General Level of consciousness: awake and alert Pain management: pain level controlled Vital Signs Assessment: post-procedure vital signs reviewed and stable Respiratory status: spontaneous breathing, nonlabored ventilation, respiratory function stable and patient connected to nasal cannula oxygen Cardiovascular status: blood pressure returned to baseline and stable Postop Assessment: no signs of nausea or vomiting Anesthetic complications: no       Last Vitals:  Vitals:   07/14/16 1530 07/14/16 1545  BP: (!) 104/56 (!) 106/57  Pulse: 66 66  Resp: 15 17  Temp:  36.7 C    Last Pain:  Vitals:   07/14/16 1545  TempSrc:   PainSc: 0-No pain                 [name]

## 2016-07-14 NOTE — Transfer of Care (Signed)
Immediate Anesthesia Transfer of Care Note  Patient: Paige Glass  Procedure(s) Performed: Procedure(s): OPEN REDUCTION INTERNAL FIXATION (ORIF) FOOT LISFRANC FRACTURE (Left)  Patient Location: PACU  Anesthesia Type:General  Level of Consciousness: awake, oriented and patient cooperative  Airway & Oxygen Therapy: Patient Spontanous Breathing and Patient connected to face mask oxygen  Post-op Assessment: Report given to RN and Post -op Vital signs reviewed and stable  Post vital signs: Reviewed  Last Vitals:  Vitals:   07/14/16 1222 07/14/16 1455  BP: (!) 83/39 (!) 108/51  Pulse: 66 72  Resp: 13 17  Temp:      Last Pain:  Vitals:   07/14/16 1455  TempSrc:   PainSc: (P) 0-No pain      Patients Stated Pain Goal: 9 (88/82/80 0349)  Complications: No apparent anesthesia complications

## 2016-07-15 ENCOUNTER — Encounter (HOSPITAL_COMMUNITY): Payer: Self-pay | Admitting: Orthopedic Surgery

## 2016-07-15 NOTE — Op Note (Signed)
NAME:  Paige Glass, Paige Glass                     ACCOUNT NO.:  MEDICAL RECORD NO.:  [id number]  LOCATION:                                 FACILITY:  PHYSICIAN:  Anderson Malta, M.D.         DATE OF BIRTH:  DATE OF PROCEDURE: DATE OF DISCHARGE:                              OPERATIVE REPORT   PREOPERATIVE DIAGNOSIS:  Left calcaneal tuberosity avulsion fracture.  POSTOPERATIVE DIAGNOSIS:  Left calcaneal tuberosity avulsion fracture.  PROCEDURE:  Left calcaneal tuberosity avulsion fracture open reduction and internal fixation.  SURGEON:  Anderson Malta, M.D.  ASSISTANT:  April Green, RNFA.  INDICATIONS:  Liseth is a 76 year old patient with calcaneal tuberosity avulsion fracture, who presents for operative management after explanation of risks and benefits.  Injury happened yesterday.  PROCEDURE IN DETAIL:  The patient was brought to the operating room where general anesthetic was induced.  Preoperative IV antibiotics administered.  Time-out was called.  The patient was then placed prone with all extremities well padded and the left foot and ankle region were prescrubbed with alcohol and Betadine, allowed to air dry, then prepped with DuraPrep solution and draped in a sterile manner.  Charlie Pitter was used to cover the operative field.  Ankle Esmarch utilized for approximately 30 minutes.  Incision was made midline over the Achilles tendon extending down to the calcaneal region.  The fracture was identified. The fracture bed was irrigated and visualized.  The fracture was reduced under direct visualization.  Two K-wires were placed, which were then used to affect reduction.  Once the 2 K-wires were placed in the fragment, they were used as a joystick to put the fracture back in the fracture bed.  Near anatomic alignment was achieved under fluoroscopic guidance in both the AP and lateral planes.  5.0 partially-threaded lag type cancellous screws were placed in bicortical fashion with care  being taken to only have approximately 2 threads penetrate through the distal cortex of the calcaneus.  Good reduction was achieved.  Bone grafting was performed at the small 2 mm gap immediately posterior, which gave no gap.  Maximal plantar flexion was required in order to take tension off the repair.  The Esmarch was released.  Bleeding points encountered and were controlled using bipolar electrocautery.  Incision was then closed using a combination of 2-0 Vicryl, 3-0 Vicryl, as well as 3-0 nylon simple sutures.  Aquacel dressing placed and the foot was then splinted in max plantar flexion.  The patient was then transferred to recovery room in stable condition.    Anderson Malta, M.D.    GSD/MEDQ  D:  07/14/2016  T:  07/14/2016  Job:  [id number]

## 2016-07-23 ENCOUNTER — Ambulatory Visit (INDEPENDENT_AMBULATORY_CARE_PROVIDER_SITE_OTHER): Payer: Self-pay

## 2016-07-23 ENCOUNTER — Encounter (INDEPENDENT_AMBULATORY_CARE_PROVIDER_SITE_OTHER): Payer: Self-pay | Admitting: Orthopedic Surgery

## 2016-07-23 ENCOUNTER — Ambulatory Visit (INDEPENDENT_AMBULATORY_CARE_PROVIDER_SITE_OTHER): Payer: PPO

## 2016-07-23 ENCOUNTER — Ambulatory Visit (INDEPENDENT_AMBULATORY_CARE_PROVIDER_SITE_OTHER): Payer: PPO | Admitting: Orthopedic Surgery

## 2016-07-23 DIAGNOSIS — S92032D Displaced avulsion fracture of tuberosity of left calcaneus, subsequent encounter for fracture with routine healing: Secondary | ICD-10-CM

## 2016-07-23 DIAGNOSIS — M79672 Pain in left foot: Secondary | ICD-10-CM | POA: Diagnosis not present

## 2016-07-23 NOTE — Progress Notes (Signed)
   Post-Op Visit Note   Patient: Paige Glass           Date of Birth: Feb 28, 1941           MRN: [id number] Visit Date: 07/23/2016 PCP: Asencion Noble, MD   Assessment & Plan:  Chief Complaint:  Chief Complaint  Patient presents with  . Left Foot - Routine Post Op   Visit Diagnoses:  1. Left foot pain   2. Closed displaced avulsion fracture of tuberosity of left calcaneus with routine healing, subsequent encounter     Plan: [name] is a 76 year old patient with left calcaneal tuberosity avulsion fracture fixation on exam she is plantar flexed.  No calf tenderness.  Hardware is in good position.  Skin incision looks intact except for one region which has a little bit of discoloration measuring 4 x 2 mm.  I'm going to keep her in a splint which is lighter and then try for suture removal on Monday.  Continue nonweightbearing  Follow-Up Instructions: No Follow-up on file.   Orders:  Orders Placed This Encounter  Procedures  . XR Foot 2 Views Left   No orders of the defined types were placed in this encounter.   Imaging: Xr Foot 2 Views Left  Result Date: 07/23/2016 2 views left heel and calcaneus reviewed.  2 screws transfix avulsion fracture of the calcaneal tuberosity.  Hardware appears to be in good position.  There is been no change in displacement or fracture alignment compared with radiographs immediately postop.  No evidence of hardware complication   PMFS History: There are no active problems to display for this patient.  Past Medical History:  Diagnosis Date  . Back pain   . Joint pain   . Joint swelling   . Nocturia   . Osteoporosis     No family history on file.  Past Surgical History:  Procedure Laterality Date  . bilateral ear surgery    . OPEN REDUCTION INTERNAL FIXATION (ORIF) FOOT LISFRANC FRACTURE Left 07/14/2016   Procedure: OPEN REDUCTION INTERNAL FIXATION (ORIF) FOOT LISFRANC FRACTURE;  Surgeon: Meredith Pel, MD;  Location: [facility];  Service:  Orthopedics;  Laterality: Left;  . TRIGGER FINGER RELEASE Right    Social History   Occupational History  . Not on file.   Social History Main Topics  . Smoking status: Current Every Day Smoker    Packs/day: 0.50    Years: 25.00    Types: Cigarettes  . Smokeless tobacco: Never Used  . Alcohol use No  . Drug use: No  . Sexual activity: Not on file

## 2016-07-28 ENCOUNTER — Encounter (INDEPENDENT_AMBULATORY_CARE_PROVIDER_SITE_OTHER): Payer: Self-pay | Admitting: Orthopedic Surgery

## 2016-07-28 ENCOUNTER — Ambulatory Visit (INDEPENDENT_AMBULATORY_CARE_PROVIDER_SITE_OTHER): Payer: PPO | Admitting: Orthopedic Surgery

## 2016-07-28 DIAGNOSIS — S92032D Displaced avulsion fracture of tuberosity of left calcaneus, subsequent encounter for fracture with routine healing: Secondary | ICD-10-CM

## 2016-07-28 NOTE — Progress Notes (Signed)
   Post-Op Visit Note   Patient: Paige Glass           Date of Birth: 23-Sep-1940           MRN: [id number] Visit Date: 07/28/2016 PCP: Asencion Noble, MD   Assessment & Plan:  Chief Complaint:  Chief Complaint  Patient presents with  . Foot Injury    F/U LEFT CALCANEUS AVULSION FX OF TUBEROSITY   Visit Diagnoses:  1. Closed displaced avulsion fracture of tuberosity of left calcaneus with routine healing, subsequent encounter    Ayanna comes in today 2 weeks out left calcaneus avulsion fracture tuberosity fixation.  His been doing recently well.  Sutures removed today.  One small area in the midportion of that incision looks like it has about a 2 x 2 millimeter area of eschar but there is no drainage or redness.  Plan at this time is to have her start doing some ankle dorsiflexion exercises.  Still nonweightbearing.  We'll put her in a fracture boot just so that posterior incision doesn't see too much pressure.  Check her back in 1 week. Plan:   Follow-Up Instructions: Return in about 1 week (around 08/04/2016).   Orders:  No orders of the defined types were placed in this encounter.  No orders of the defined types were placed in this encounter.   Imaging: No results found.  PMFS History: There are no active problems to display for this patient.  Past Medical History:  Diagnosis Date  . Back pain   . Joint pain   . Joint swelling   . Nocturia   . Osteoporosis     No family history on file.  Past Surgical History:  Procedure Laterality Date  . bilateral ear surgery    . OPEN REDUCTION INTERNAL FIXATION (ORIF) FOOT LISFRANC FRACTURE Left 07/14/2016   Procedure: OPEN REDUCTION INTERNAL FIXATION (ORIF) FOOT LISFRANC FRACTURE;  Surgeon: Meredith Pel, MD;  Location: [facility];  Service: Orthopedics;  Laterality: Left;  . TRIGGER FINGER RELEASE Right    Social History   Occupational History  . Not on file.   Social History Main Topics  . Smoking status: Current Every  Day Smoker    Packs/day: 0.50    Years: 25.00    Types: Cigarettes  . Smokeless tobacco: Never Used  . Alcohol use No  . Drug use: No  . Sexual activity: Not on file

## 2016-08-04 ENCOUNTER — Ambulatory Visit (INDEPENDENT_AMBULATORY_CARE_PROVIDER_SITE_OTHER): Payer: PPO | Admitting: Orthopedic Surgery

## 2016-08-04 ENCOUNTER — Encounter (INDEPENDENT_AMBULATORY_CARE_PROVIDER_SITE_OTHER): Payer: Self-pay | Admitting: Orthopedic Surgery

## 2016-08-04 DIAGNOSIS — S92032D Displaced avulsion fracture of tuberosity of left calcaneus, subsequent encounter for fracture with routine healing: Secondary | ICD-10-CM

## 2016-08-04 MED ORDER — METHOCARBAMOL 500 MG PO TABS: 500.0000 mg | ORAL_TABLET | Freq: Three times a day (TID) | ORAL | 0 refills | Status: DC | PRN

## 2016-08-04 MED ORDER — OXYCODONE HCL 5 MG PO TABS: 5.0000 mg | ORAL_TABLET | Freq: Four times a day (QID) | ORAL | 0 refills | Status: DC | PRN

## 2016-08-04 NOTE — Progress Notes (Signed)
   Post-Op Visit Note   Patient: Paige Glass           Date of Birth: 06/10/1940           MRN: [id number] Visit Date: 08/04/2016 PCP: Asencion Noble, MD   Assessment & Plan:  Chief Complaint:  Chief Complaint  Patient presents with  . Left Ankle - Routine Post Op   Visit Diagnoses: No diagnosis found.  Plan: Paige Glass is a 76 year old patient who is now about 3 weeks out avulsion fracture fixation of the calcaneus.  She is been doing recently well.  The incision looks good.  Help the take any drainage or evidence of infection.  Continue Humira therapy and says she can work on ankle dorsiflexion.  We'll probably have to let her start doing some weightbearing next week but we'll get some x-rays first.  My have her see Dr. Sharol Given as well as consults to potentially have him consider gastroc recession in order to lengthen that heel cord.  We'll give her 1-2 weeks to get going on her own but for now I don't think it's looking so good in terms of the heel cord contracture she's developing.  We will remove his Steri-Strips and recheck the incision again next week as well.  Follow-Up Instructions: No Follow-up on file.   Orders:  No orders of the defined types were placed in this encounter.  Meds ordered this encounter  Medications  . methocarbamol (ROBAXIN) 500 MG tablet    Sig: Take 1 tablet (500 mg total) by mouth every 8 (eight) hours as needed for muscle spasms.    Dispense:  30 tablet    Refill:  0  . oxyCODONE (OXY IR/ROXICODONE) 5 MG immediate release tablet    Sig: Take 1 tablet (5 mg total) by mouth every 6 (six) hours as needed for severe pain.    Dispense:  30 tablet    Refill:  0    Imaging: No results found.  PMFS History: There are no active problems to display for this patient.  Past Medical History:  Diagnosis Date  . Back pain   . Joint pain   . Joint swelling   . Nocturia   . Osteoporosis     No family history on file.  Past Surgical History:  Procedure  Laterality Date  . bilateral ear surgery    . OPEN REDUCTION INTERNAL FIXATION (ORIF) FOOT LISFRANC FRACTURE Left 07/14/2016   Procedure: OPEN REDUCTION INTERNAL FIXATION (ORIF) FOOT LISFRANC FRACTURE;  Surgeon: Meredith Pel, MD;  Location: [facility];  Service: Orthopedics;  Laterality: Left;  . TRIGGER FINGER RELEASE Right    Social History   Occupational History  . Not on file.   Social History Main Topics  . Smoking status: Current Every Day Smoker    Packs/day: 0.50    Years: 25.00    Types: Cigarettes  . Smokeless tobacco: Never Used  . Alcohol use No  . Drug use: No  . Sexual activity: Not on file

## 2016-08-06 ENCOUNTER — Inpatient Hospital Stay (INDEPENDENT_AMBULATORY_CARE_PROVIDER_SITE_OTHER): Payer: PPO | Admitting: Orthopedic Surgery

## 2016-08-11 ENCOUNTER — Encounter (INDEPENDENT_AMBULATORY_CARE_PROVIDER_SITE_OTHER): Payer: Self-pay | Admitting: Orthopedic Surgery

## 2016-08-11 ENCOUNTER — Ambulatory Visit (INDEPENDENT_AMBULATORY_CARE_PROVIDER_SITE_OTHER): Payer: PPO | Admitting: Orthopedic Surgery

## 2016-08-11 DIAGNOSIS — S92032D Displaced avulsion fracture of tuberosity of left calcaneus, subsequent encounter for fracture with routine healing: Secondary | ICD-10-CM

## 2016-08-11 DIAGNOSIS — M25572 Pain in left ankle and joints of left foot: Secondary | ICD-10-CM | POA: Diagnosis not present

## 2016-08-11 NOTE — Progress Notes (Signed)
   Post-Op Visit Note   Patient: Paige Glass           Date of Birth: [birth year]           MRN: [id number] Visit Date: 08/11/2016 PCP: Asencion Noble, MD   Assessment & Plan:  Chief Complaint:  Chief Complaint  Patient presents with  . Left Ankle - Routine Post Op   Visit Diagnoses: No diagnosis found.  Plan: [name] is a 76 year old patient is now a month out calcaneal avulsion fracture fixation.  On exam still has quite a bit of a flexion contracture.  She's been working on it on her own.  She has been nonweightbearing.  Incision looks reasonable.  Steri-Strips in place.  Plan at this time is to have her follow up with Dr. Sharol Given in 2 weeks.  She may need to have heel cord lengthening performed.  No calf tenderness to direct palpation today.  Okay to be touchdown weightbearing in the fracture boot.  Need to stop smoking discussed at length with her during this healing phase.  Follow-Up Instructions: Return in about 2 weeks (around 08/25/2016).   Orders:  No orders of the defined types were placed in this encounter.  No orders of the defined types were placed in this encounter.   Imaging: No results found.  PMFS History: There are no active problems to display for this patient.  Past Medical History:  Diagnosis Date  . Back pain   . Joint pain   . Joint swelling   . Nocturia   . Osteoporosis     No family history on file.  Past Surgical History:  Procedure Laterality Date  . bilateral ear surgery    . OPEN REDUCTION INTERNAL FIXATION (ORIF) FOOT LISFRANC FRACTURE Left 07/14/2016   Procedure: OPEN REDUCTION INTERNAL FIXATION (ORIF) FOOT LISFRANC FRACTURE;  Surgeon: Meredith Pel, MD;  Location: [facility];  Service: Orthopedics;  Laterality: Left;  . TRIGGER FINGER RELEASE Right    Social History   Occupational History  . Not on file.   Social History Main Topics  . Smoking status: Current Every Day Smoker    Packs/day: 0.50    Years: 25.00    Types: Cigarettes  .  Smokeless tobacco: Never Used  . Alcohol use No  . Drug use: No  . Sexual activity: Not on file

## 2016-08-13 DIAGNOSIS — S92002A Unspecified fracture of left calcaneus, initial encounter for closed fracture: Secondary | ICD-10-CM | POA: Diagnosis not present

## 2016-08-15 ENCOUNTER — Telehealth (INDEPENDENT_AMBULATORY_CARE_PROVIDER_SITE_OTHER): Payer: Self-pay | Admitting: Orthopedic Surgery

## 2016-08-15 NOTE — Telephone Encounter (Signed)
Called advised patient should be ok to take shower

## 2016-08-15 NOTE — Telephone Encounter (Signed)
PT CALLED TO ASK IF SHE IS ABLE TO GET HER FOOT WET TO TAKE A BATH, SHE IS POST SURG.  [phone]

## 2016-08-25 ENCOUNTER — Ambulatory Visit (INDEPENDENT_AMBULATORY_CARE_PROVIDER_SITE_OTHER): Payer: PPO | Admitting: Orthopedic Surgery

## 2016-08-25 ENCOUNTER — Encounter (INDEPENDENT_AMBULATORY_CARE_PROVIDER_SITE_OTHER): Payer: Self-pay | Admitting: Orthopedic Surgery

## 2016-08-25 DIAGNOSIS — S92002D Unspecified fracture of left calcaneus, subsequent encounter for fracture with routine healing: Secondary | ICD-10-CM | POA: Insufficient documentation

## 2016-08-25 DIAGNOSIS — M24562 Contracture, left knee: Secondary | ICD-10-CM

## 2016-08-25 NOTE — Progress Notes (Signed)
   Office Visit Note   Patient: Paige Glass           Date of Birth: [birth year]           MRN: [id number] Visit Date: 08/25/2016              Requested by: Asencion Noble, MD [address] [place], [place] [address] PCP: Asencion Noble, MD  Chief Complaint  Patient presents with  . Left Ankle - Pain      HPI: Patient is status post avulsion fracture of the calcaneus on the left with open reduction internal fixation. Patient secondarily has developed a flexion contracture of the Achilles and despite aggressive therapy she has not regained sufficient motion to get her foot plantar grade.  Assessment & Plan: Visit Diagnoses:  1. Avulsion fracture of calcaneus with routine healing, left   2. Flexion contracture of joint of left lower leg     Plan: We'll plan for gastrocnemius recession and possible Z-lengthening of the Achilles. We will place her in a postoperative fracture boot that she has and keep her at 90 postoperatively with weightbearing as tolerated.  Follow-Up Instructions: Return in about 2 weeks (around 09/08/2016).   Ortho Exam  Patient is alert, oriented, no adenopathy, well-dressed, normal affect, normal respiratory effort. Examination patient has a significant flexion contracture of about 40 with both the knee flexed and extended. She has good pulses. Patient states that she has just stop smoking for 2 weeks. The incision is well-healed. There is no redness no cellulitis no drainage no signs of infection.  Imaging: No results found.  Labs: No results found for: HGBA1C, ESRSEDRATE, CRP, LABURIC, REPTSTATUS, GRAMSTAIN, CULT, LABORGA  Orders:  No orders of the defined types were placed in this encounter.  No orders of the defined types were placed in this encounter.    Procedures: No procedures performed  Clinical Data: No additional findings.  ROS:  All other systems negative, except as noted in the HPI. Review of Systems  Objective: Vital Signs:  There were no vitals taken for this visit.  Specialty Comments:  No specialty comments available.  PMFS History: Patient Active Problem List   Diagnosis Date Noted  . Flexion contracture of joint of left lower leg 08/25/2016  . Avulsion fracture of calcaneus with routine healing, left 08/25/2016   Past Medical History:  Diagnosis Date  . Back pain   . Joint pain   . Joint swelling   . Nocturia   . Osteoporosis     No family history on file.  Past Surgical History:  Procedure Laterality Date  . bilateral ear surgery    . OPEN REDUCTION INTERNAL FIXATION (ORIF) FOOT LISFRANC FRACTURE Left 07/14/2016   Procedure: OPEN REDUCTION INTERNAL FIXATION (ORIF) FOOT LISFRANC FRACTURE;  Surgeon: Meredith Pel, MD;  Location: [facility];  Service: Orthopedics;  Laterality: Left;  . TRIGGER FINGER RELEASE Right    Social History   Occupational History  . Not on file.   Social History Main Topics  . Smoking status: Former Smoker    Packs/day: 0.50    Years: 25.00    Types: Cigarettes    Quit date: 08/11/2016  . Smokeless tobacco: Never Used  . Alcohol use No  . Drug use: No  . Sexual activity: Not on file

## 2016-09-02 DIAGNOSIS — M6702 Short Achilles tendon (acquired), left ankle: Secondary | ICD-10-CM | POA: Diagnosis not present

## 2016-09-10 ENCOUNTER — Telehealth (INDEPENDENT_AMBULATORY_CARE_PROVIDER_SITE_OTHER): Payer: Self-pay | Admitting: Orthopedic Surgery

## 2016-09-10 NOTE — Telephone Encounter (Signed)
U call. Recommend patient discontinue the narcotic pain medicine and use either Tylenol or Aleve 2 by mouth twice a day for pain. Reinforced the importance of range of motion exercises.

## 2016-09-10 NOTE — Telephone Encounter (Signed)
I called and spoke with patients daughter Paige Glass. I called and advised tylenol or 2 aleve bid. Advised to discontinue pain medication. Reinforced importance of rom exercises. She is working with exercise band, and continues with fracture boot. They will call us if they need anything else.

## 2016-09-10 NOTE — Telephone Encounter (Signed)
Patient's daughter Tera Mater) called for her mother concerning the medication that was prescribed for her. Linus Orn said her mother is concerned about the pain medicine that was prescribed by Dr. Sharol Given. Linus Orn advised her mother prefer to take the low dosage (Hydrocodone)   The number to contact Linus Orn is [phone]. She said her mother has a cell phone and the number is [phone]. Please leave a message if her mom does not answer the phone.

## 2016-09-13 DIAGNOSIS — S92002A Unspecified fracture of left calcaneus, initial encounter for closed fracture: Secondary | ICD-10-CM | POA: Diagnosis not present

## 2016-09-16 ENCOUNTER — Ambulatory Visit (INDEPENDENT_AMBULATORY_CARE_PROVIDER_SITE_OTHER): Payer: PPO | Admitting: Orthopedic Surgery

## 2016-09-16 ENCOUNTER — Inpatient Hospital Stay (INDEPENDENT_AMBULATORY_CARE_PROVIDER_SITE_OTHER): Payer: PPO | Admitting: Orthopedic Surgery

## 2016-09-16 ENCOUNTER — Encounter (INDEPENDENT_AMBULATORY_CARE_PROVIDER_SITE_OTHER): Payer: Self-pay | Admitting: Orthopedic Surgery

## 2016-09-16 VITALS — Ht 59.0 in | Wt 113.0 lb

## 2016-09-16 DIAGNOSIS — M6702 Short Achilles tendon (acquired), left ankle: Secondary | ICD-10-CM

## 2016-09-16 NOTE — Progress Notes (Signed)
   Office Visit Note   Patient: Paige Glass           Date of Birth: 11/29/1940           MRN: [id number] Visit Date: 09/16/2016              Requested by: Asencion Noble, MD [address] [place], [place] [address] PCP: Asencion Noble, MD  Chief Complaint  Patient presents with  . Left Foot - Routine Post Op    07/14/16 ORIF left calcaneal tuberosity with Dr. Marlou Sa  09/02/16 MUA left ankle for contracture.       HPI: Patient presents status post manipulation under anesthesia for Achilles tendon contracture of the left. Patient's postoperative dorsiflexion past neutral was about 20.  Assessment & Plan: Visit Diagnoses:  1. Achilles tendon contracture, left     Plan: Patient was given instructions and demonstrated heel cord stretching she will do his 5 times a day minute of time she will not use the exercise therapy and tubing. Regular shoewear discontinue the fracture boot. Patient has quit smoking at this time.  Follow-Up Instructions: Return in about 2 weeks (around 09/30/2016).   Ortho Exam  Patient is alert, oriented, no adenopathy, well-dressed, normal affect, normal respiratory effort. Examination patient has developed recurrent contracture. She has an equinus contracture about 40. The skin is intact she has some mild venous stasis swelling there is no signs of infection.  Imaging: No results found.  Labs: No results found for: HGBA1C, ESRSEDRATE, CRP, LABURIC, REPTSTATUS, GRAMSTAIN, CULT, LABORGA  Orders:  No orders of the defined types were placed in this encounter.  No orders of the defined types were placed in this encounter.    Procedures: No procedures performed  Clinical Data: No additional findings.  ROS:  All other systems negative, except as noted in the HPI. Review of Systems  Objective: Vital Signs: Ht 4\' 11"  (1.499 m)   Wt 113 lb (51.3 kg)   BMI 22.82 kg/m   Specialty Comments:  No specialty comments available.  PMFS  History: Patient Active Problem List   Diagnosis Date Noted  . Achilles tendon contracture, left 09/16/2016  . Flexion contracture of joint of left lower leg 08/25/2016  . Avulsion fracture of calcaneus with routine healing, left 08/25/2016   Past Medical History:  Diagnosis Date  . Back pain   . Joint pain   . Joint swelling   . Nocturia   . Osteoporosis     No family history on file.  Past Surgical History:  Procedure Laterality Date  . bilateral ear surgery    . OPEN REDUCTION INTERNAL FIXATION (ORIF) FOOT LISFRANC FRACTURE Left 07/14/2016   Procedure: OPEN REDUCTION INTERNAL FIXATION (ORIF) FOOT LISFRANC FRACTURE;  Surgeon: Meredith Pel, MD;  Location: [facility];  Service: Orthopedics;  Laterality: Left;  . TRIGGER FINGER RELEASE Right    Social History   Occupational History  . Not on file.   Social History Main Topics  . Smoking status: Former Smoker    Packs/day: 0.50    Years: 25.00    Types: Cigarettes    Quit date: 08/11/2016  . Smokeless tobacco: Never Used  . Alcohol use No  . Drug use: No  . Sexual activity: Not on file

## 2016-09-30 ENCOUNTER — Encounter (INDEPENDENT_AMBULATORY_CARE_PROVIDER_SITE_OTHER): Payer: Self-pay | Admitting: Orthopedic Surgery

## 2016-09-30 ENCOUNTER — Ambulatory Visit (INDEPENDENT_AMBULATORY_CARE_PROVIDER_SITE_OTHER): Payer: PPO | Admitting: Orthopedic Surgery

## 2016-09-30 ENCOUNTER — Telehealth (INDEPENDENT_AMBULATORY_CARE_PROVIDER_SITE_OTHER): Payer: Self-pay | Admitting: Orthopedic Surgery

## 2016-09-30 VITALS — Ht 59.0 in | Wt 113.0 lb

## 2016-09-30 DIAGNOSIS — M6702 Short Achilles tendon (acquired), left ankle: Secondary | ICD-10-CM

## 2016-09-30 NOTE — Telephone Encounter (Signed)
Will hold message and Dr. Sharol Given will discuss at office visit today. If HHPT is ordered I will fax the request to [facility] today.

## 2016-09-30 NOTE — Telephone Encounter (Signed)
Order given for physical therapy. Pt will contact [facility] to get this set up.

## 2016-09-30 NOTE — Progress Notes (Signed)
Office Visit Note   Patient: Paige Glass           Date of Birth: Aug 06, 1940           MRN: [id number] Visit Date: 09/30/2016              Requested by: Asencion Noble, MD [address] [place], [place] [address] PCP: Asencion Noble, MD  Chief Complaint  Patient presents with  . Left Foot - Routine Post Op    07/14/16 ORIF calcaneal tuberosity with Dr. Marlou Sa 09/02/16 manipulation under anesthesia left contracture.        HPI: Patient is a 76 year old woman status post manipulation under anesthesia for Achilles contracture. Patient for range of motion postoperatively patient presents at this time with recurrent contracture. She has been working on heel cord stretching on her own without improvement.  Assessment & Plan: Visit Diagnoses:  1. Achilles tendon contracture, left     Plan: Patient given a prescription for physical therapy at [facility] for Achilles stretching. No restrictions. She'll continue stretching on her own and this was again demonstrated to her she states that this is different than what she was doing follow-up in 2 weeks. There is no open wounds no need for wound care.  Follow-Up Instructions: Return in about 2 weeks (around 10/14/2016).   Ortho Exam  Patient is alert, oriented, no adenopathy, well-dressed, normal affect, normal respiratory effort. Examination patient has recurrent contracture with pressure I can improve the contracture and this is not as bad as it was but she is developing recurrent contracture. There is no cellulitis no signs of infection. There is no open wounds.  Imaging: No results found.  Labs: No results found for: HGBA1C, ESRSEDRATE, CRP, LABURIC, REPTSTATUS, GRAMSTAIN, CULT, LABORGA  Orders:  Orders Placed This Encounter  Procedures  . Ambulatory referral to Physical Therapy   No orders of the defined types were placed in this encounter.    Procedures: No procedures performed  Clinical Data: No additional  findings.  ROS:  All other systems negative, except as noted in the HPI. Review of Systems  Objective: Vital Signs: Ht 4\' 11"  (1.499 m)   Wt 113 lb (51.3 kg)   BMI 22.82 kg/m   Specialty Comments:  No specialty comments available.  PMFS History: Patient Active Problem List   Diagnosis Date Noted  . Achilles tendon contracture, left 09/16/2016  . Flexion contracture of joint of left lower leg 08/25/2016  . Avulsion fracture of calcaneus with routine healing, left 08/25/2016   Past Medical History:  Diagnosis Date  . Back pain   . Joint pain   . Joint swelling   . Nocturia   . Osteoporosis     No family history on file.  Past Surgical History:  Procedure Laterality Date  . bilateral ear surgery    . OPEN REDUCTION INTERNAL FIXATION (ORIF) FOOT LISFRANC FRACTURE Left 07/14/2016   Procedure: OPEN REDUCTION INTERNAL FIXATION (ORIF) FOOT LISFRANC FRACTURE;  Surgeon: Meredith Pel, MD;  Location: [facility];  Service: Orthopedics;  Laterality: Left;  . TRIGGER FINGER RELEASE Right    Social History   Occupational History  . Not on file.   Social History Main Topics  . Smoking status: Former Smoker    Packs/day: 0.50    Years: 25.00    Types: Cigarettes    Quit date: 08/11/2016  . Smokeless tobacco: Never Used  . Alcohol use No  . Drug use: No  . Sexual activity:  Not on file

## 2016-09-30 NOTE — Telephone Encounter (Signed)
Paige Glass patient's daughter feels like her mother need in home therapy. Patient has an appointment today with Sharol Given and the daughter will not able to accompany her to this appointment

## 2016-10-06 ENCOUNTER — Ambulatory Visit (HOSPITAL_COMMUNITY): Payer: PPO | Attending: Orthopedic Surgery | Admitting: Physical Therapy

## 2016-10-06 ENCOUNTER — Encounter (HOSPITAL_COMMUNITY): Payer: Self-pay | Admitting: Physical Therapy

## 2016-10-06 DIAGNOSIS — M25672 Stiffness of left ankle, not elsewhere classified: Secondary | ICD-10-CM | POA: Diagnosis not present

## 2016-10-06 DIAGNOSIS — R262 Difficulty in walking, not elsewhere classified: Secondary | ICD-10-CM

## 2016-10-06 DIAGNOSIS — M6281 Muscle weakness (generalized): Secondary | ICD-10-CM | POA: Diagnosis not present

## 2016-10-06 DIAGNOSIS — M6249 Contracture of muscle, multiple sites: Secondary | ICD-10-CM | POA: Insufficient documentation

## 2016-10-06 DIAGNOSIS — R2689 Other abnormalities of gait and mobility: Secondary | ICD-10-CM

## 2016-10-06 NOTE — Patient Instructions (Signed)
   DORSIFLEXION - SUPINE  While lying down on your back, bend your ankle to move your foot upwards or towards the direction of your knee as shown.  Next, point your toes as far away from you as you can.  Hold each position for 3 seconds.  Repeat 15 times, at least 3 times per day.    INVERSION - SUPINE  While lying down on your back, bend your ankle to move your foot inward or towards the midline of your body as shown.   Do not let your leg rotate or turn.  Repeat 10-15 times, at least 3 times per day.    ANKLE CIRCLES  Move your ankle in a circular pattern one direction for several repetitions and then reverse the direction.  Repeat 15-20 times clockwise and counterclockwise, at least 3 times a day.     SEATED CALF STRETCH - GASTROC  While sitting, use a towel or other strap looped around your foot. Gently pull your ankle back until a stretch is felt along the back of your lower leg.  Hold for at least 1 minute.  Repeat 3 times on the left, at least 3 times per day.

## 2016-10-06 NOTE — Therapy (Signed)
[facility] [address] [place], [name], [address] Phone: [phone]   Fax:  [phone]  Physical Therapy Evaluation  Patient Details  Name: Paige Glass MRN: [id number] Date of Birth: 1940-12-05 Referring Provider: Newt Minion   Encounter Date: 10/06/2016      PT End of Session - 10/06/16 1740    Visit Number 1   Number of Visits 17   Date for PT Re-Evaluation 11/03/16   Authorization Type [employer]    Authorization Time Period 10/06/16 to 12/06/16   Authorization - Visit Number 1   Authorization - Number of Visits 10   PT Start Time 8546   PT Stop Time 1646   PT Time Calculation (min) 43 min   Activity Tolerance Patient tolerated treatment well   Behavior During Therapy [facility] for tasks assessed/performed      Past Medical History:  Diagnosis Date  . Back pain   . Joint pain   . Joint swelling   . Nocturia   . Osteoporosis     Past Surgical History:  Procedure Laterality Date  . bilateral ear surgery    . OPEN REDUCTION INTERNAL FIXATION (ORIF) FOOT LISFRANC FRACTURE Left 07/14/2016   Procedure: OPEN REDUCTION INTERNAL FIXATION (ORIF) FOOT LISFRANC FRACTURE;  Surgeon: Meredith Pel, MD;  Location: [facility];  Service: Orthopedics;  Laterality: Left;  . TRIGGER FINGER RELEASE Right     There were no vitals filed for this visit.       Subjective Assessment - 10/06/16 1606    Subjective Patient reports she broke her heel when she fell off some stairs on Easter Sunday this year; she proceeded to walk on this for awhile as she was determined to have a good holiday with her family.  EPIC notes show an ORIF for a LisFranc fracture on 07/14/16. She then had Dr. Sharol Given do a manipulation on her anlke due to achilles tendon contracture, which was done about 5 weeks ago; last time she saw Dr. Sharol Given he stated that he thinks her foot is reverting to how it was before the manipulation. She does not have any restrictions from  Dr. Sharol Given but her foot does swell when she does not keep her foot elevated or her shoe on.     Pertinent History avulsion fracture L heel, achilles contracture, osteoporosis, no other significant PMH    How long can you sit comfortably? unlimited    How long can you stand comfortably? unlimited    How long can you walk comfortably? 2-3 steps, fairly immediate    Patient Stated Goals walk normally, get back to prior level of activity and take care of herself    Currently in Pain? No/denies            [facility] PT Assessment - 10/06/16 0001      Assessment   Medical Diagnosis achilles tendon contracture    Referring Provider Meridee Score V    Onset Date/Surgical Date 07/14/16   Next MD Visit Dr. Sharol Given July 3rd    Prior Therapy none, just exercises from MD      Precautions   Precautions Fall     Restrictions   Other Position/Activity Restrictions per Dr. Jess Barters most recent progress note, no restrictions      Balance Screen   Has the patient fallen in the past 6 months Yes   How many times? 1   Has the patient had a decrease in activity level because of a  fear of falling?  No   Is the patient reluctant to leave their home because of a fear of falling?  No     Prior Function   Level of Independence Independent;Independent with gait;Independent with transfers;Independent with basic ADLs   Vocation Retired   Leisure get in yard and work, loves to garden, spending time with family      Observation/Other Assessments   Observations generalized localized edema    Skin Integrity scabbed area posterior distal achilles, no signs of inflammation or infection; whole foot appears discolored    Focus on Therapeutic Outcomes (FOTO)  63% limited      AROM   Right Ankle Dorsiflexion 15   Right Ankle Plantar Flexion 63   Right Ankle Inversion 50   Right Ankle Eversion 15   Left Ankle Dorsiflexion -5   Left Ankle Plantar Flexion 54   Left Ankle Inversion 18   Left Ankle Eversion 4      Strength   Right Knee Flexion 5/5   Right Knee Extension 5/5   Left Knee Flexion 4+/5   Left Knee Extension 5/5   Right Ankle Dorsiflexion 5/5   Right Ankle Inversion 5/5   Right Ankle Eversion 5/5   Left Ankle Dorsiflexion 4/5  ROM limited    Left Ankle Inversion 4/5  ROM limtied    Left Ankle Eversion 3-/5  ROM limited      Ambulation/Gait   Gait Comments L ankle PF contracture/reduced L ankle DF, reduced step length R/stance time L, poor heel-toe mechancis, heavy use of UEs with gait             Objective measurements completed on examination: See above findings.          [facility] PT Treatment/Exercise - 10/06/16 0001      Exercises   Exercises Ankle     Ankle Exercises: Stretches   Gastroc Stretch 1 rep;60 seconds   Gastroc Stretch Limitations L      Ankle Exercises: Seated   Ankle Circles/Pumps Left;10 reps  2 sets, CW and CCW      Ankle Exercises: Supine   Other Supine Ankle Exercises supine DF/PF with 3 second hiolds 1x10 L; supine supination 1x10 L; attemtped supine eversion but unable to perform exercise correctly by herself/will require further skilled attention                 PT Education - 10/06/16 1739    Education provided Yes   Education Details prognosis, exam findings, POC, HEP; skin care and scabbed area on back of ankle, continue to protect with bandaids/we will monitor. How to avoid compensations with HEP.    Person(s) Educated Patient   Methods Explanation;Demonstration;Handout   Comprehension Verbalized understanding;Returned demonstration;Need further instruction          PT Short Term Goals - 10/06/16 1748      PT SHORT TERM GOAL #1   Title Patient to demonstrate L ankle ROM as being within 8 degrees of that of R on all measured planes in order to improve mechanics and symmetry of gait pattern    Time 4   Period Weeks   Status New     PT SHORT TERM GOAL #2   Title Patient to consistently be using heel-toe gait  pattern with walker in order to improve efficiency of gait and promote normalized ankle mechancis    Time 4   Period Weeks   Status New     PT SHORT TERM GOAL #  3   Title Patient to be able to verbalize proper skin care and shoe fit principles in order to prevent worsening or formation of another area of skin breakdown on posterior L ankle    Time 4   Period Weeks   Status New     PT SHORT TERM GOAL #4   Title Patient to corrrectly and consistently perform appropriate HEP, to be updated PRN    Time 1   Period Weeks   Status New           PT Long Term Goals - 10/06/16 1750      PT LONG TERM GOAL #1   Title Patient to show improvement in muscle strength as having improved by at least 1 MMT grade in all tested groups in order to show improved gait and balance skills    Time 8   Period Weeks   Status New     PT LONG TERM GOAL #2   Title Patient to be able to ambulate community distances with LRAD and household distances with no device and consistent heel-toe pattern with equal stance times/step lengths in order to improve safe mobility and community access    Time 8   Period Weeks   Status New     PT LONG TERM GOAL #3   Title Patient to be able to complete TUG test within 12 seconds with LRAD and will also be able to maintain SLS for at least 20 seconds on solid surface each LE in order to show improved mobility and balance    Time 8   Period Weeks   Status New     PT LONG TERM GOAL #4   Title Patient to be able to ascend/descend full flight of stairs with U railing, minimal unsteadiness in order to show improved ankle ROM and muscle control for safe home/community navigation    Time 8   Period Weeks   Status New     PT LONG TERM GOAL #5   Title Patient to report she has been able to return to all PLOF based tasks with minimal unsteadiness and no pain increase in order to improve QOL    Time 8   Period Weeks   Status New                Plan - 10/06/16 1744     Clinical Impression Statement Patient reports that she initially fractured her heel while falling sideways off some stairs on Easter Sunday; she had an initial surgery for a calcaneal ORIF on 07/14/16, and then a manipulation for ankle plantar flexion contracture on 09/02/16. Most recent surgeon's progress note states no restrictions for this ankle at this time. Exam reveals significant L ankle stiffness, L ankle muscle weakness also limited by ROM, gait deviation, general unsteadiness, and reduced ability to return to PLOF based tasks. Recommend skilled PT services to address functional deficits, reduce fall risk, and assist in return to optimal level of function.    History and Personal Factors relevant to plan of care: clear mode of injury and chain of events leading to current condition; high functional level prior to this injury    Clinical Presentation Stable   Clinical Presentation due to: injury following fall from stairs/post-surgical state    Clinical Decision Making Low   Rehab Potential Good   Clinical Impairments Affecting Rehab Potential (+) high functional level prior to injury, motivated to attempt to return to PLOF; (-) chronicity of current impairment/surgical intervention with regression  since surgery per referring MD for this impairment    PT Frequency 2x / week   PT Duration 8 weeks   PT Treatment/Interventions ADLs/Self Care Home Management;Biofeedback;Cryotherapy;Electrical Stimulation;Moist Heat;Ultrasound;DME Instruction;Gait training;Stair training;Functional mobility training;Therapeutic activities;Therapeutic exercise;Balance training;Neuromuscular re-education;Patient/family education;Orthotic Fit/Training;Manual techniques;Scar mobilization;Passive range of motion;Dry needling;Taping   PT Next Visit Plan review HEP and initial eval/goals; ROM and STM/joint mobilizations to L ankle in OKC and CKC positions. Gait trainging for heel-toe pattern. Monitor scabbed area posterior  ankle.    PT Home Exercise Plan Eval: gastroc stretch, supine ankle PF/DF and supination, ankle circles, heel-toe walking    Recommended Other Services possible compression stocking pending status moving forward    Consulted and Agree with Plan of Care Patient      Patient will benefit from skilled therapeutic intervention in order to improve the following deficits and impairments:  Abnormal gait, Decreased skin integrity, Increased fascial restricitons, Decreased coordination, Decreased mobility, Decreased range of motion, Decreased strength, Hypomobility, Decreased balance, Difficulty walking, Impaired flexibility  Visit Diagnosis: Stiffness of left ankle, not elsewhere classified - Plan: PT plan of care cert/re-cert  Other abnormalities of gait and mobility - Plan: PT plan of care cert/re-cert  Contracture of muscle, multiple sites - Plan: PT plan of care cert/re-cert  Difficulty in walking, not elsewhere classified - Plan: PT plan of care cert/re-cert  Muscle weakness (generalized) - Plan: PT plan of care cert/re-cert      G-Codes - 38/93/73 1755    Functional Assessment Tool Used (Outpatient Only) Based on skilled clinical assessment of ROM, gait, strength, general balance and mobility    Functional Limitation Mobility: Walking and moving around   Mobility: Walking and Moving Around Current Status (S2876) At least 60 percent but less than 80 percent impaired, limited or restricted   Mobility: Walking and Moving Around Goal Status [id number]) At least 40 percent but less than 60 percent impaired, limited or restricted       Problem List Patient Active Problem List   Diagnosis Date Noted  . Achilles tendon contracture, left 09/16/2016  . Flexion contracture of joint of left lower leg 08/25/2016  . Avulsion fracture of calcaneus with routine healing, left 08/25/2016    Deniece Ree PT, DPT [place], [name], [address] Phone: [phone]   Fax:  [phone]  Name: Shanaia Sievers Krapf MRN: [id number] Date of Birth: 18-Apr-1940

## 2016-10-08 ENCOUNTER — Ambulatory Visit (HOSPITAL_COMMUNITY): Payer: PPO | Admitting: Physical Therapy

## 2016-10-08 DIAGNOSIS — R262 Difficulty in walking, not elsewhere classified: Secondary | ICD-10-CM

## 2016-10-08 DIAGNOSIS — M6249 Contracture of muscle, multiple sites: Secondary | ICD-10-CM

## 2016-10-08 DIAGNOSIS — M6281 Muscle weakness (generalized): Secondary | ICD-10-CM

## 2016-10-08 DIAGNOSIS — R2689 Other abnormalities of gait and mobility: Secondary | ICD-10-CM

## 2016-10-08 DIAGNOSIS — M25672 Stiffness of left ankle, not elsewhere classified: Secondary | ICD-10-CM | POA: Diagnosis not present

## 2016-10-08 NOTE — Therapy (Signed)
[place], [name], [address] Phone: [phone]   Fax:  [phone]  Physical Therapy Treatment  Patient Details  Name: Paige Glass MRN: [id number] Date of Birth: [birth year] Referring Provider: Newt Minion   Encounter Date: 10/08/2016      PT End of Session - 10/08/16 3419    Visit Number 2   Number of Visits 17   Date for PT Re-Evaluation 11/03/16   Authorization Type [employer]    Authorization Time Period 10/06/16 to 12/06/16   Authorization - Visit Number 2   Authorization - Number of Visits 10   PT Start Time 1603   PT Stop Time 1644   PT Time Calculation (min) 41 min   Activity Tolerance Patient tolerated treatment well   Behavior During Therapy [facility] for tasks assessed/performed      Past Medical History:  Diagnosis Date  . Back pain   . Joint pain   . Joint swelling   . Nocturia   . Osteoporosis     Past Surgical History:  Procedure Laterality Date  . bilateral ear surgery    . OPEN REDUCTION INTERNAL FIXATION (ORIF) FOOT LISFRANC FRACTURE Left 07/14/2016   Procedure: OPEN REDUCTION INTERNAL FIXATION (ORIF) FOOT LISFRANC FRACTURE;  Surgeon: Meredith Pel, MD;  Location: [facility];  Service: Orthopedics;  Laterality: Left;  . TRIGGER FINGER RELEASE Right     There were no vitals filed for this visit.      Subjective Assessment - 10/08/16 1612    Subjective Patient arrives stating she is doing well, she is compliant with HEP and has been working on her walking    Pertinent History avulsion fracture L heel, achilles contracture, osteoporosis, no other significant PMH    Patient Stated Goals walk normally, get back to prior level of activity and take care of herself    Currently in Pain? No/denies                         [facility] Adult PT Treatment/Exercise - 10/08/16 0001      Manual Therapy   Manual Therapy Joint mobilization;Soft tissue mobilization   Manual  therapy comments separate from all other skilled services    Joint Mobilization foot/ankle supination/pronation   Soft tissue mobilization scar and achilles/gastroc      Ankle Exercises: Stretches   Gastroc Stretch 3 reps;30 seconds   Gastroc Stretch Limitations L supine      Ankle Exercises: Standing   Other Standing Ankle Exercises knee drives with foot flat on stair for DF ROM 10 second holds 2x10   gait training approx 33ft after each set with walker      Ankle Exercises: Seated   Ankle Circles/Pumps Left;15 reps  cues to reduce LE compensation    Other Seated Ankle Exercises rockerboard PF/DF with 2 secondholds x2 minutes                 PT Education - 10/08/16 1647    Education provided Yes   Education Details review of initial eval/goals   Person(s) Educated Patient   Methods Explanation   Comprehension Verbalized understanding          PT Short Term Goals - 10/06/16 1748      PT SHORT TERM GOAL #1   Title Patient to demonstrate L ankle ROM as being within 8 degrees of that of R on all measured planes in order to improve  mechanics and symmetry of gait pattern    Time 4   Period Weeks   Status New     PT SHORT TERM GOAL #2   Title Patient to consistently be using heel-toe gait pattern with walker in order to improve efficiency of gait and promote normalized ankle mechancis    Time 4   Period Weeks   Status New     PT SHORT TERM GOAL #3   Title Patient to be able to verbalize proper skin care and shoe fit principles in order to prevent worsening or formation of another area of skin breakdown on posterior L ankle    Time 4   Period Weeks   Status New     PT SHORT TERM GOAL #4   Title Patient to corrrectly and consistently perform appropriate HEP, to be updated PRN    Time 1   Period Weeks   Status New           PT Long Term Goals - 10/06/16 1750      PT LONG TERM GOAL #1   Title Patient to show improvement in muscle strength as having  improved by at least 1 MMT grade in all tested groups in order to show improved gait and balance skills    Time 8   Period Weeks   Status New     PT LONG TERM GOAL #2   Title Patient to be able to ambulate community distances with LRAD and household distances with no device and consistent heel-toe pattern with equal stance times/step lengths in order to improve safe mobility and community access    Time 8   Period Weeks   Status New     PT LONG TERM GOAL #3   Title Patient to be able to complete TUG test within 12 seconds with LRAD and will also be able to maintain SLS for at least 20 seconds on solid surface each LE in order to show improved mobility and balance    Time 8   Period Weeks   Status New     PT LONG TERM GOAL #4   Title Patient to be able to ascend/descend full flight of stairs with U railing, minimal unsteadiness in order to show improved ankle ROM and muscle control for safe home/community navigation    Time 8   Period Weeks   Status New     PT LONG TERM GOAL #5   Title Patient to report she has been able to return to all PLOF based tasks with minimal unsteadiness and no pain increase in order to improve QOL    Time 8   Period Weeks   Status New               Plan - 10/08/16 1648    Clinical Impression Statement Reviewed initial eval/goals and then proceeded with manual to foot/ankle region for improved functional mechanics. Also performed  open and closed chain ankle DF mobility exercises intermittent with heel-toe gait training (cues for heel-toe pattern and step length/stance time provided)for carry-over and consistently of motion. Patient remains motivated to participate with skilled PT services and appears to be making some progress towards some short term goals already.    Rehab Potential Good   Clinical Impairments Affecting Rehab Potential (+) high functional level prior to injury, motivated to attempt to return to PLOF; (-) chronicity of current  impairment/surgical intervention with regression since surgery per referring MD for this impairment    PT Frequency 2x /  week   PT Duration 8 weeks   PT Treatment/Interventions Biofeedback;Cryotherapy;Electrical Stimulation;Moist Heat;Ultrasound;DME Instruction;Gait training;Stair training;Functional mobility training;Therapeutic activities;Therapeutic exercise;Balance training;Neuromuscular re-education;Patient/family education;Orthotic Fit/Training;Manual techniques;Scar mobilization;Passive range of motion;Dry needling;Taping;ADLs/Self Care Home Management   PT Next Visit Plan check area on back of L heel. Continue with manual for ROM, OKC and CKC moblity exericse. Trial hemiwalker or quad cane, otherwise continue gait training with walker    PT Home Exercise Plan Eval: gastroc stretch, supine ankle PF/DF and supination, ankle circles, heel-toe walking    Consulted and Agree with Plan of Care Patient      Patient will benefit from skilled therapeutic intervention in order to improve the following deficits and impairments:  Abnormal gait, Decreased skin integrity, Increased fascial restricitons, Decreased coordination, Decreased mobility, Decreased range of motion, Decreased strength, Hypomobility, Decreased balance, Difficulty walking, Impaired flexibility  Visit Diagnosis: Stiffness of left ankle, not elsewhere classified  Other abnormalities of gait and mobility  Contracture of muscle, multiple sites  Difficulty in walking, not elsewhere classified  Muscle weakness (generalized)     Problem List Patient Active Problem List   Diagnosis Date Noted  . Achilles tendon contracture, left 09/16/2016  . Flexion contracture of joint of left lower leg 08/25/2016  . Avulsion fracture of calcaneus with routine healing, left 08/25/2016    Deniece Ree PT, DPT [place] [address] [place], [name], [address] Phone:  [phone]   Fax:  [phone]  Name: Gita Dilger Bebeau MRN: [id number] Date of Birth: 1940/07/07

## 2016-10-13 ENCOUNTER — Encounter (HOSPITAL_COMMUNITY): Payer: Self-pay

## 2016-10-13 ENCOUNTER — Ambulatory Visit (HOSPITAL_COMMUNITY): Payer: PPO | Attending: Orthopedic Surgery

## 2016-10-13 DIAGNOSIS — M6281 Muscle weakness (generalized): Secondary | ICD-10-CM | POA: Diagnosis not present

## 2016-10-13 DIAGNOSIS — M6249 Contracture of muscle, multiple sites: Secondary | ICD-10-CM | POA: Diagnosis not present

## 2016-10-13 DIAGNOSIS — R2689 Other abnormalities of gait and mobility: Secondary | ICD-10-CM | POA: Diagnosis not present

## 2016-10-13 DIAGNOSIS — R262 Difficulty in walking, not elsewhere classified: Secondary | ICD-10-CM | POA: Insufficient documentation

## 2016-10-13 DIAGNOSIS — M25672 Stiffness of left ankle, not elsewhere classified: Secondary | ICD-10-CM | POA: Diagnosis not present

## 2016-10-13 NOTE — Therapy (Signed)
[facility] [address] [place], [name], [address] Phone: [phone]   Fax:  [phone]  Physical Therapy Treatment  Patient Details  Name: Paige Glass MRN: [id number] Date of Birth: 03/09/1941 Referring Provider: Newt Minion   Encounter Date: 10/13/2016      PT End of Session - 10/13/16 1436    Visit Number 3   Number of Visits 17   Date for PT Re-Evaluation 11/03/16   Authorization Type [employer]    Authorization Time Period 10/06/16 to 12/06/16   Authorization - Visit Number 3   Authorization - Number of Visits 10   PT Start Time 1435   PT Stop Time 1525   PT Time Calculation (min) 50 min   Activity Tolerance Patient tolerated treatment well   Behavior During Therapy [facility] for tasks assessed/performed      Past Medical History:  Diagnosis Date  . Back pain   . Joint pain   . Joint swelling   . Nocturia   . Osteoporosis     Past Surgical History:  Procedure Laterality Date  . bilateral ear surgery    . OPEN REDUCTION INTERNAL FIXATION (ORIF) FOOT LISFRANC FRACTURE Left 07/14/2016   Procedure: OPEN REDUCTION INTERNAL FIXATION (ORIF) FOOT LISFRANC FRACTURE;  Surgeon: Meredith Pel, MD;  Location: [facility];  Service: Orthopedics;  Laterality: Left;  . TRIGGER FINGER RELEASE Right     There were no vitals filed for this visit.      Subjective Assessment - 10/13/16 1438    Subjective Pt states that she did a lot of exercises over the weekend and her heel is sore today. She thinks that bending her toes back has gotten better.    Pertinent History avulsion fracture L heel, achilles contracture, osteoporosis, no other significant PMH    Patient Stated Goals walk normally, get back to prior level of activity and take care of herself    Currently in Pain? Yes   Pain Score 6    Pain Location Heel   Pain Orientation Left   Pain Descriptors / Indicators Sore   Aggravating Factors  walking on it   Pain  Relieving Factors rest   Effect of Pain on Daily Activities increases                [facility] Adult PT Treatment/Exercise - 10/13/16 0001      Ambulation/Gait   Ambulation/Gait Yes   Ambulation Distance (Feet) 150 Feet  159ft with standard walker, 38ft with quad cane   Assistive device Standard walker;Small based quad cane   Gait Comments L ankle PF throughout, max verbal and tactile cueing on proper swing through and heel to toe gait with standard walker and quad cane     Manual Therapy   Manual Therapy Joint mobilization;Soft tissue mobilization   Manual therapy comments separate from all other skilled services    Joint Mobilization Grade III supination/pronation and DF joint mobs 10 reps x 10-15" bouts each   Soft tissue mobilization scar and achilles/gastroc      Ankle Exercises: Seated   ABC's 1 rep   BAPS Sitting;Level 3;10 reps  DF, inv/ev     Ankle Exercises: Standing   Other Standing Ankle Exercises L knee drives on 6" step 10 x 10" holds                PT Short Term Goals - 10/06/16 1748      PT SHORT TERM GOAL #1  Title Patient to demonstrate L ankle ROM as being within 8 degrees of that of R on all measured planes in order to improve mechanics and symmetry of gait pattern    Time 4   Period Weeks   Status New     PT SHORT TERM GOAL #2   Title Patient to consistently be using heel-toe gait pattern with walker in order to improve efficiency of gait and promote normalized ankle mechancis    Time 4   Period Weeks   Status New     PT SHORT TERM GOAL #3   Title Patient to be able to verbalize proper skin care and shoe fit principles in order to prevent worsening or formation of another area of skin breakdown on posterior L ankle    Time 4   Period Weeks   Status New     PT SHORT TERM GOAL #4   Title Patient to corrrectly and consistently perform appropriate HEP, to be updated PRN    Time 1   Period Weeks   Status New           PT Long  Term Goals - 10/06/16 1750      PT LONG TERM GOAL #1   Title Patient to show improvement in muscle strength as having improved by at least 1 MMT grade in all tested groups in order to show improved gait and balance skills    Time 8   Period Weeks   Status New     PT LONG TERM GOAL #2   Title Patient to be able to ambulate community distances with LRAD and household distances with no device and consistent heel-toe pattern with equal stance times/step lengths in order to improve safe mobility and community access    Time 8   Period Weeks   Status New     PT LONG TERM GOAL #3   Title Patient to be able to complete TUG test within 12 seconds with LRAD and will also be able to maintain SLS for at least 20 seconds on solid surface each LE in order to show improved mobility and balance    Time 8   Period Weeks   Status New     PT LONG TERM GOAL #4   Title Patient to be able to ascend/descend full flight of stairs with U railing, minimal unsteadiness in order to show improved ankle ROM and muscle control for safe home/community navigation    Time 8   Period Weeks   Status New     PT LONG TERM GOAL #5   Title Patient to report she has been able to return to all PLOF based tasks with minimal unsteadiness and no pain increase in order to improve QOL    Time 8   Period Weeks   Status New               Plan - 10/13/16 1540    Clinical Impression Statement Session continued to focus on improving ankle AROM and gait mechanics. Introduced pt to [employer] and ABCs for ROM. She required cues throughout all of therex this date, and required max cues for proper gait pattern with her standard walker. Pt was noted to have increased L knee extension and decreased L ankle DF during swing through, and increased L knee flexion and ankle PF during stance. PT cued pt to increased knee flexion and ankle DF throughout swing through and increase heel strike and knee extension during initial contact/midstance;  pt had  significant difficulty sequencing this and needs continued training on it.    Rehab Potential Good   Clinical Impairments Affecting Rehab Potential (+) high functional level prior to injury, motivated to attempt to return to PLOF; (-) chronicity of current impairment/surgical intervention with regression since surgery per referring MD for this impairment    PT Frequency 2x / week   PT Duration 8 weeks   PT Treatment/Interventions Biofeedback;Cryotherapy;Electrical Stimulation;Moist Heat;Ultrasound;DME Instruction;Gait training;Stair training;Functional mobility training;Therapeutic activities;Therapeutic exercise;Balance training;Neuromuscular re-education;Patient/family education;Orthotic Fit/Training;Manual techniques;Scar mobilization;Passive range of motion;Dry needling;Taping;ADLs/Self Care Home Management   PT Next Visit Plan check area on back of L heel. Continue with manual for ROM, OKC and CKC moblity exericse. Continue gait with walker and trial with quad cane again; introduce standing calf stretch and MWM for ankle DF   PT Home Exercise Plan Eval: gastroc stretch, supine ankle PF/DF and supination, ankle circles, heel-toe walking    Consulted and Agree with Plan of Care Patient      Patient will benefit from skilled therapeutic intervention in order to improve the following deficits and impairments:  Abnormal gait, Decreased skin integrity, Increased fascial restricitons, Decreased coordination, Decreased mobility, Decreased range of motion, Decreased strength, Hypomobility, Decreased balance, Difficulty walking, Impaired flexibility  Visit Diagnosis: Stiffness of left ankle, not elsewhere classified  Other abnormalities of gait and mobility  Contracture of muscle, multiple sites  Difficulty in walking, not elsewhere classified  Muscle weakness (generalized)     Problem List Patient Active Problem List   Diagnosis Date Noted  . Achilles tendon contracture, left  09/16/2016  . Flexion contracture of joint of left lower leg 08/25/2016  . Avulsion fracture of calcaneus with routine healing, left 08/25/2016     Geraldine Solar PT, DPT  [facility] [address] [place], [name], [address] Phone: [phone]   Fax:  [phone]  Name: Paige Glass MRN: [id number] Date of Birth: [birth year]

## 2016-10-14 ENCOUNTER — Ambulatory Visit (INDEPENDENT_AMBULATORY_CARE_PROVIDER_SITE_OTHER): Payer: PPO | Admitting: Orthopedic Surgery

## 2016-10-14 DIAGNOSIS — M6702 Short Achilles tendon (acquired), left ankle: Secondary | ICD-10-CM

## 2016-10-14 NOTE — Progress Notes (Signed)
   Office Visit Note   Patient: Paige Glass           Date of Birth: [birth year]           MRN: [id number] Visit Date: 10/14/2016              Requested by: Asencion Noble, MD [id number] [place], [place] [address] PCP: Asencion Noble, MD  Chief Complaint  Patient presents with  . Left Foot - Follow-up      HPI: Patient presents she is about 5 weeks status post manipulation under anesthesia for contracture left Achilles. Patient states that she is going to physical therapy and working on stretching on her own.  Assessment & Plan: Visit Diagnoses:  1. Achilles tendon contracture, left     Plan: Recommend continue heel cord stretching discussed that she will need to do this for a year.  Follow-Up Instructions: Return in about 4 weeks (around 11/11/2016).   Ortho Exam  Patient is alert, oriented, no adenopathy, well-dressed, normal affect, normal respiratory effort. Examination there is no open wound there is no cellulitis patient does have some recurrent tightness but it's not as tight as it was preoperatively. She has dorsiflexion about 10 short of neutral. She is ambulating with a walker and therapy is try to get her on a 4 pronged cane.  Imaging: No results found.  Labs: No results found for: HGBA1C, ESRSEDRATE, CRP, LABURIC, REPTSTATUS, GRAMSTAIN, CULT, LABORGA  Orders:  No orders of the defined types were placed in this encounter.  No orders of the defined types were placed in this encounter.    Procedures: No procedures performed  Clinical Data: No additional findings.  ROS:  All other systems negative, except as noted in the HPI. Review of Systems  Objective: Vital Signs: There were no vitals taken for this visit.  Specialty Comments:  No specialty comments available.  PMFS History: Patient Active Problem List   Diagnosis Date Noted  . Achilles tendon contracture, left 09/16/2016  . Flexion contracture of joint of left lower leg 08/25/2016  .  Avulsion fracture of calcaneus with routine healing, left 08/25/2016   Past Medical History:  Diagnosis Date  . Back pain   . Joint pain   . Joint swelling   . Nocturia   . Osteoporosis     No family history on file.  Past Surgical History:  Procedure Laterality Date  . bilateral ear surgery    . OPEN REDUCTION INTERNAL FIXATION (ORIF) FOOT LISFRANC FRACTURE Left 07/14/2016   Procedure: OPEN REDUCTION INTERNAL FIXATION (ORIF) FOOT LISFRANC FRACTURE;  Surgeon: Meredith Pel, MD;  Location: [facility];  Service: Orthopedics;  Laterality: Left;  . TRIGGER FINGER RELEASE Right    Social History   Occupational History  . Not on file.   Social History Main Topics  . Smoking status: Former Smoker    Packs/day: 0.50    Years: 25.00    Types: Cigarettes    Quit date: 08/11/2016  . Smokeless tobacco: Never Used  . Alcohol use No  . Drug use: No  . Sexual activity: Not on file

## 2016-10-16 ENCOUNTER — Ambulatory Visit (HOSPITAL_COMMUNITY): Payer: PPO

## 2016-10-16 ENCOUNTER — Encounter (HOSPITAL_COMMUNITY): Payer: Self-pay

## 2016-10-16 DIAGNOSIS — M25672 Stiffness of left ankle, not elsewhere classified: Secondary | ICD-10-CM | POA: Diagnosis not present

## 2016-10-16 DIAGNOSIS — M6281 Muscle weakness (generalized): Secondary | ICD-10-CM

## 2016-10-16 DIAGNOSIS — R2689 Other abnormalities of gait and mobility: Secondary | ICD-10-CM

## 2016-10-16 DIAGNOSIS — R262 Difficulty in walking, not elsewhere classified: Secondary | ICD-10-CM

## 2016-10-16 DIAGNOSIS — M6249 Contracture of muscle, multiple sites: Secondary | ICD-10-CM

## 2016-10-16 NOTE — Therapy (Signed)
[facility] [place], [name], [address] Phone: [phone]   Fax:  [phone]  Physical Therapy Treatment  Patient Details  Name: Paige Glass MRN: [id number] Date of Birth: [birth year] Referring Provider: Newt Minion   Encounter Date: 10/16/2016      PT End of Session - 10/16/16 1127    Visit Number 4   Number of Visits 17   Date for PT Re-Evaluation 11/03/16   Authorization Type [employer]    Authorization Time Period 10/06/16 to 12/06/16   Authorization - Visit Number 4   Authorization - Number of Visits 10   PT Start Time 1122   PT Stop Time 1208   PT Time Calculation (min) 46 min   Activity Tolerance Patient tolerated treatment well   Behavior During Therapy [facility] for tasks assessed/performed      Past Medical History:  Diagnosis Date  . Back pain   . Joint pain   . Joint swelling   . Nocturia   . Osteoporosis     Past Surgical History:  Procedure Laterality Date  . bilateral ear surgery    . OPEN REDUCTION INTERNAL FIXATION (ORIF) FOOT LISFRANC FRACTURE Left 07/14/2016   Procedure: OPEN REDUCTION INTERNAL FIXATION (ORIF) FOOT LISFRANC FRACTURE;  Surgeon: Meredith Pel, MD;  Location: [facility];  Service: Orthopedics;  Laterality: Left;  . TRIGGER FINGER RELEASE Right     There were no vitals filed for this visit.      Subjective Assessment - 10/16/16 1125    Subjective Pt states that her ankle is sore this morning. She saw Dr. Sharol Given on Tuesday. She states he told her it would take her a year to recover from this and to see him again in 4 weeks. She states he wanted her to continue to work on stretching and improving her DF ROM.    Pertinent History avulsion fracture L heel, achilles contracture, osteoporosis, no other significant PMH    Patient Stated Goals walk normally, get back to prior level of activity and take care of herself    Currently in Pain? No/denies                   [facility] Adult PT Treatment/Exercise - 10/16/16 0001      Manual Therapy   Manual Therapy Joint mobilization;Soft tissue mobilization   Manual therapy comments separate from all other skilled services    Joint Mobilization Grade III supination/pronation and DF joint mobs 10 reps x 10-15" bouts each; MWM ankle DF on 12" step and with strap 15 x 3-5 oscillations at end range   Soft tissue mobilization IASTM with tool to scar and achilles/gastroc     Ankle Exercises: Stretches   Gastroc Stretch 5 reps;30 seconds   Gastroc Stretch Limitations standing, slant board     Ankle Exercises: Seated   Other Seated Ankle Exercises rockerboard PF/DF with 2 secondholds x2 minutes      Ankle Exercises: Standing   Other Standing Ankle Exercises --  MWM L knee drives on step; see manual therapy section                PT Education - 10/16/16 1211    Education provided Yes   Education Details exercise technique, continue HEP   Person(s) Educated Patient   Methods Explanation;Demonstration   Comprehension Verbalized understanding;Returned demonstration          PT Short Term Goals - 10/06/16 1748  PT SHORT TERM GOAL #1   Title Patient to demonstrate L ankle ROM as being within 8 degrees of that of R on all measured planes in order to improve mechanics and symmetry of gait pattern    Time 4   Period Weeks   Status New     PT SHORT TERM GOAL #2   Title Patient to consistently be using heel-toe gait pattern with walker in order to improve efficiency of gait and promote normalized ankle mechancis    Time 4   Period Weeks   Status New     PT SHORT TERM GOAL #3   Title Patient to be able to verbalize proper skin care and shoe fit principles in order to prevent worsening or formation of another area of skin breakdown on posterior L ankle    Time 4   Period Weeks   Status New     PT SHORT TERM GOAL #4   Title Patient to corrrectly and consistently perform appropriate HEP, to be  updated PRN    Time 1   Period Weeks   Status New           PT Long Term Goals - 10/06/16 1750      PT LONG TERM GOAL #1   Title Patient to show improvement in muscle strength as having improved by at least 1 MMT grade in all tested groups in order to show improved gait and balance skills    Time 8   Period Weeks   Status New     PT LONG TERM GOAL #2   Title Patient to be able to ambulate community distances with LRAD and household distances with no device and consistent heel-toe pattern with equal stance times/step lengths in order to improve safe mobility and community access    Time 8   Period Weeks   Status New     PT LONG TERM GOAL #3   Title Patient to be able to complete TUG test within 12 seconds with LRAD and will also be able to maintain SLS for at least 20 seconds on solid surface each LE in order to show improved mobility and balance    Time 8   Period Weeks   Status New     PT LONG TERM GOAL #4   Title Patient to be able to ascend/descend full flight of stairs with U railing, minimal unsteadiness in order to show improved ankle ROM and muscle control for safe home/community navigation    Time 8   Period Weeks   Status New     PT LONG TERM GOAL #5   Title Patient to report she has been able to return to all PLOF based tasks with minimal unsteadiness and no pain increase in order to improve QOL    Time 8   Period Weeks   Status New               Plan - 10/16/16 1211    Clinical Impression Statement Session focused on manual and stretching to improve overall ankle ROM, especially DF. Utilized IASTM tool for soft tissue restrictions of L ankle. Introduced pt to ankle DF MWM and standing calf stretch on slant board. She did well throughout session, reporting some discomfort with manual therapy. Pt's AROM slowly improving; she was -5 DF, 20 inversion and 6 eversion this date. Continue POC as planned.   Rehab Potential Good   Clinical Impairments Affecting  Rehab Potential (+) high functional level prior to injury,  motivated to attempt to return to PLOF; (-) chronicity of current impairment/surgical intervention with regression since surgery per referring MD for this impairment    PT Frequency 2x / week   PT Duration 8 weeks   PT Treatment/Interventions Biofeedback;Cryotherapy;Electrical Stimulation;Moist Heat;Ultrasound;DME Instruction;Gait training;Stair training;Functional mobility training;Therapeutic activities;Therapeutic exercise;Balance training;Neuromuscular re-education;Patient/family education;Orthotic Fit/Training;Manual techniques;Scar mobilization;Passive range of motion;Dry needling;Taping;ADLs/Self Care Home Management   PT Next Visit Plan check area on back of L heel. Continue with manual for ROM, OKC and CKC moblity exericse. Continue gait with walker and trial with quad cane again; continue standing calf stretch and MWM for ankle DF; continue manual with IASTM tool   PT Home Exercise Plan Eval: gastroc stretch, supine ankle PF/DF and supination, ankle circles, heel-toe walking    Consulted and Agree with Plan of Care Patient      Patient will benefit from skilled therapeutic intervention in order to improve the following deficits and impairments:  Abnormal gait, Decreased skin integrity, Increased fascial restricitons, Decreased coordination, Decreased mobility, Decreased range of motion, Decreased strength, Hypomobility, Decreased balance, Difficulty walking, Impaired flexibility  Visit Diagnosis: Stiffness of left ankle, not elsewhere classified  Other abnormalities of gait and mobility  Contracture of muscle, multiple sites  Difficulty in walking, not elsewhere classified  Muscle weakness (generalized)     Problem List Patient Active Problem List   Diagnosis Date Noted  . Achilles tendon contracture, left 09/16/2016  . Flexion contracture of joint of left lower leg 08/25/2016  . Avulsion fracture of calcaneus with  routine healing, left 08/25/2016     Geraldine Solar PT, DPT  [facility] [address] [place], [name], [address] Phone: [phone]   Fax:  [phone]  Name: Paige Glass MRN: [id number] Date of Birth: March 23, 1941

## 2016-10-20 ENCOUNTER — Encounter (HOSPITAL_COMMUNITY): Payer: Self-pay

## 2016-10-20 ENCOUNTER — Ambulatory Visit (HOSPITAL_COMMUNITY): Payer: PPO

## 2016-10-20 DIAGNOSIS — R2689 Other abnormalities of gait and mobility: Secondary | ICD-10-CM

## 2016-10-20 DIAGNOSIS — M6281 Muscle weakness (generalized): Secondary | ICD-10-CM

## 2016-10-20 DIAGNOSIS — M6249 Contracture of muscle, multiple sites: Secondary | ICD-10-CM

## 2016-10-20 DIAGNOSIS — R262 Difficulty in walking, not elsewhere classified: Secondary | ICD-10-CM

## 2016-10-20 DIAGNOSIS — M25672 Stiffness of left ankle, not elsewhere classified: Secondary | ICD-10-CM

## 2016-10-20 NOTE — Therapy (Signed)
[place], [name], [address] Phone: [phone]   Fax:  [phone]  Physical Therapy Treatment  Patient Details  Name: Paige Glass MRN: [id number] Date of Birth: 1940/08/13 Referring Provider: Newt Minion   Encounter Date: 10/20/2016      PT End of Session - 10/20/16 1431    Visit Number 5   Number of Visits 17   Date for PT Re-Evaluation 11/03/16   Authorization Type [employer]    Authorization Time Period 10/06/16 to 12/06/16   Authorization - Visit Number 5   Authorization - Number of Visits 10   PT Start Time 2202   PT Stop Time 1516   PT Time Calculation (min) 45 min   Activity Tolerance Patient tolerated treatment well   Behavior During Therapy [facility] for tasks assessed/performed      Past Medical History:  Diagnosis Date  . Back pain   . Joint pain   . Joint swelling   . Nocturia   . Osteoporosis     Past Surgical History:  Procedure Laterality Date  . bilateral ear surgery    . OPEN REDUCTION INTERNAL FIXATION (ORIF) FOOT LISFRANC FRACTURE Left 07/14/2016   Procedure: OPEN REDUCTION INTERNAL FIXATION (ORIF) FOOT LISFRANC FRACTURE;  Surgeon: Meredith Pel, MD;  Location: [place];  Service: Orthopedics;  Laterality: Left;  . TRIGGER FINGER RELEASE Right     There were no vitals filed for this visit.      Subjective Assessment - 10/20/16 1431    Subjective Pt states that the back of her ankle is burning which increases after she walks on it a lot.   Pertinent History avulsion fracture L heel, achilles contracture, osteoporosis, no other significant PMH    Patient Stated Goals walk normally, get back to prior level of activity and take care of herself    Currently in Pain? No/denies                 [facility] Adult PT Treatment/Exercise - 10/20/16 0001      Ambulation/Gait   Ambulation/Gait Yes   Ambulation Distance (Feet) 150 Feet   Assistive device Standard walker   Gait Pattern Step-through pattern;Decreased step length - left;Decreased stride length   Gait Comments initially with increased L ankle PF throughout R push-off and swing through; cued pt to maintain L heel contact with the ground until R heel strike and pt able to demo understanding but her gait speed was significantly reduced when ambulating with this proper pattern     Manual Therapy   Manual Therapy Joint mobilization;Soft tissue mobilization   Manual therapy comments separate from all other skilled services    Joint Mobilization Grade III-IV DF joint mob 15 reps x 10-15" bouts each; MWM ankle DF on 12" step and with strap 15 x 3-5 oscillations at end range; MWM with strap on 12" step 15 x3-5 sec holds at end range each   Soft tissue mobilization IASTM with tool x 3 mins but did not complete any further this date because increased redness around open wound on scar     Ankle Exercises: Stretches   Gastroc Stretch 5 reps;30 seconds   Gastroc Stretch Limitations standing, slant board     Ankle Exercises: Standing   Other Standing Ankle Exercises --  MWM L knee drives on step (see manual therapy)                PT Education - 10/20/16  1524    Education provided Yes   Education Details proper heel to toe pattern with walker, can try to find wheels for walker to improve gait pattern; check her temperature when she's home and make sure she is not running a fever, and to monitor the redness around the wound in her scar   Person(s) Educated Patient   Methods Explanation;Demonstration   Comprehension Verbalized understanding;Returned demonstration          PT Short Term Goals - 10/06/16 1748      PT SHORT TERM GOAL #1   Title Patient to demonstrate L ankle ROM as being within 8 degrees of that of R on all measured planes in order to improve mechanics and symmetry of gait pattern    Time 4   Period Weeks   Status New     PT SHORT TERM GOAL #2   Title Patient to consistently be  using heel-toe gait pattern with walker in order to improve efficiency of gait and promote normalized ankle mechancis    Time 4   Period Weeks   Status New     PT SHORT TERM GOAL #3   Title Patient to be able to verbalize proper skin care and shoe fit principles in order to prevent worsening or formation of another area of skin breakdown on posterior L ankle    Time 4   Period Weeks   Status New     PT SHORT TERM GOAL #4   Title Patient to corrrectly and consistently perform appropriate HEP, to be updated PRN    Time 1   Period Weeks   Status New           PT Long Term Goals - 10/06/16 1750      PT LONG TERM GOAL #1   Title Patient to show improvement in muscle strength as having improved by at least 1 MMT grade in all tested groups in order to show improved gait and balance skills    Time 8   Period Weeks   Status New     PT LONG TERM GOAL #2   Title Patient to be able to ambulate community distances with LRAD and household distances with no device and consistent heel-toe pattern with equal stance times/step lengths in order to improve safe mobility and community access    Time 8   Period Weeks   Status New     PT LONG TERM GOAL #3   Title Patient to be able to complete TUG test within 12 seconds with LRAD and will also be able to maintain SLS for at least 20 seconds on solid surface each LE in order to show improved mobility and balance    Time 8   Period Weeks   Status New     PT LONG TERM GOAL #4   Title Patient to be able to ascend/descend full flight of stairs with U railing, minimal unsteadiness in order to show improved ankle ROM and muscle control for safe home/community navigation    Time 8   Period Weeks   Status New     PT LONG TERM GOAL #5   Title Patient to report she has been able to return to all PLOF based tasks with minimal unsteadiness and no pain increase in order to improve QOL    Time 8   Period Weeks   Status New               Plan  - 10/20/16 1526  Clinical Impression Statement Pt still has small open wound in her scar and PT noted that it had increased redness this date. Pt denied feeling sick and or feverish; PT took gross measurements of area and it was noted to be approximately 3cm in width and 4cm in height. It was not significantly swollen nor warm to the touch. PT outlined redness with a marker in order to track the redness. Pt was educated to take her temperature when she got home to ensure she is not running a fever; she verbalized understanding. Rest of session focused on improving ROM and gait training; pt able to demonstrate improved gait pattern after max verbal and visual cues on proper heel to toe. Continue to monitor redness around scar and focus on improving ROM, strength, and gait.    Rehab Potential Good   Clinical Impairments Affecting Rehab Potential (+) high functional level prior to injury, motivated to attempt to return to PLOF; (-) chronicity of current impairment/surgical intervention with regression since surgery per referring MD for this impairment    PT Frequency 2x / week   PT Duration 8 weeks   PT Treatment/Interventions Biofeedback;Cryotherapy;Electrical Stimulation;Moist Heat;Ultrasound;DME Instruction;Gait training;Stair training;Functional mobility training;Therapeutic activities;Therapeutic exercise;Balance training;Neuromuscular re-education;Patient/family education;Orthotic Fit/Training;Manual techniques;Scar mobilization;Passive range of motion;Dry needling;Taping;ADLs/Self Care Home Management   PT Next Visit Plan check area on back of L heel. Continue with manual for ROM, OKC and CKC moblity exericse. Continue gait with walker and trial with quad cane if pt able to demo good carry-over of heel to toe from previous session; continue standing calf stretch and MWM for ankle DF; continue manual with IASTM tool; trial SLS for ankle stability   PT Home Exercise Plan Eval: gastroc stretch, supine  ankle PF/DF and supination, ankle circles, heel-toe walking    Consulted and Agree with Plan of Care Patient      Patient will benefit from skilled therapeutic intervention in order to improve the following deficits and impairments:  Abnormal gait, Decreased skin integrity, Increased fascial restricitons, Decreased coordination, Decreased mobility, Decreased range of motion, Decreased strength, Hypomobility, Decreased balance, Difficulty walking, Impaired flexibility  Visit Diagnosis: Stiffness of left ankle, not elsewhere classified  Other abnormalities of gait and mobility  Contracture of muscle, multiple sites  Difficulty in walking, not elsewhere classified  Muscle weakness (generalized)     Problem List Patient Active Problem List   Diagnosis Date Noted  . Achilles tendon contracture, left 09/16/2016  . Flexion contracture of joint of left lower leg 08/25/2016  . Avulsion fracture of calcaneus with routine healing, left 08/25/2016     Geraldine Solar PT, DPT  [place] [address] [place], [name], [address] Phone: [phone]   Fax:  [phone]  Name: Paige Glass MRN: [id number] Date of Birth: [birth year]

## 2016-10-23 ENCOUNTER — Ambulatory Visit (HOSPITAL_COMMUNITY): Payer: PPO | Admitting: Physical Therapy

## 2016-10-23 DIAGNOSIS — M6249 Contracture of muscle, multiple sites: Secondary | ICD-10-CM

## 2016-10-23 DIAGNOSIS — R262 Difficulty in walking, not elsewhere classified: Secondary | ICD-10-CM

## 2016-10-23 DIAGNOSIS — R2689 Other abnormalities of gait and mobility: Secondary | ICD-10-CM

## 2016-10-23 DIAGNOSIS — M25672 Stiffness of left ankle, not elsewhere classified: Secondary | ICD-10-CM | POA: Diagnosis not present

## 2016-10-23 DIAGNOSIS — M6281 Muscle weakness (generalized): Secondary | ICD-10-CM

## 2016-10-23 NOTE — Therapy (Signed)
[place] [address] [place], [name], [address] Phone: [phone]   Fax:  [phone]  Physical Therapy Treatment  Patient Details  Name: Paige Glass MRN: [id number] Date of Birth: 1941/01/09 Referring Provider: Newt Minion   Encounter Date: 10/23/2016      PT End of Session - 10/23/16 1512    Visit Number 6   Number of Visits 17   Date for PT Re-Evaluation 11/03/16   Authorization Type [employer]    Authorization Time Period 10/06/16 to 12/06/16   Authorization - Visit Number 6   Authorization - Number of Visits 10   PT Start Time 7253   PT Stop Time 1512   PT Time Calculation (min) 41 min   Activity Tolerance Patient tolerated treatment well   Behavior During Therapy [facility] for tasks assessed/performed      Past Medical History:  Diagnosis Date  . Back pain   . Joint pain   . Joint swelling   . Nocturia   . Osteoporosis     Past Surgical History:  Procedure Laterality Date  . bilateral ear surgery    . OPEN REDUCTION INTERNAL FIXATION (ORIF) FOOT LISFRANC FRACTURE Left 07/14/2016   Procedure: OPEN REDUCTION INTERNAL FIXATION (ORIF) FOOT LISFRANC FRACTURE;  Surgeon: Meredith Pel, MD;  Location: [place];  Service: Orthopedics;  Laterality: Left;  . TRIGGER FINGER RELEASE Right     There were no vitals filed for this visit.      Subjective Assessment - 10/23/16 1515    Subjective Pt states that the back of her ankle is burning which increases after she walks on it a lot. No toher coml;aints.    Pertinent History avulsion fracture L heel, achilles contracture, osteoporosis, no other significant PMH    Patient Stated Goals walk normally, get back to prior level of activity and take care of herself    Currently in Pain? No/denies                         [facility] Adult PT Treatment/Exercise - 10/23/16 0001      Manual Therapy   Manual Therapy Edema management;Joint mobilization   Manual  therapy comments separate from all other skilled services    Edema Management retrograde massage LE elevated    Joint Mobilization grade III DF joint mob x4 rounds, lateral malleolus mobilization grade III x4 rounds, foot supination/pronation      Ankle Exercises: Stretches   Soleus Stretch 4 reps;30 seconds   Gastroc Stretch 4 reps;60 seconds   Gastroc Stretch Limitations slantboard              Balance Exercises - 10/23/16 1507      Balance Exercises: Standing   SLS Eyes open;Solid surface;3 reps;15 secs           PT Education - 10/23/16 1512    Education provided Yes   Education Details consider shoes with an open back but still good sole with traction to help take shear/rpessure forces off of open wound area   Person(s) Educated Patient   Methods Explanation   Comprehension Verbalized understanding          PT Short Term Goals - 10/06/16 1748      PT SHORT TERM GOAL #1   Title Patient to demonstrate L ankle ROM as being within 8 degrees of that of R on all measured planes in order to improve mechanics and symmetry of  gait pattern    Time 4   Period Weeks   Status New     PT SHORT TERM GOAL #2   Title Patient to consistently be using heel-toe gait pattern with walker in order to improve efficiency of gait and promote normalized ankle mechancis    Time 4   Period Weeks   Status New     PT SHORT TERM GOAL #3   Title Patient to be able to verbalize proper skin care and shoe fit principles in order to prevent worsening or formation of another area of skin breakdown on posterior L ankle    Time 4   Period Weeks   Status New     PT SHORT TERM GOAL #4   Title Patient to corrrectly and consistently perform appropriate HEP, to be updated PRN    Time 1   Period Weeks   Status New           PT Long Term Goals - 10/06/16 1750      PT LONG TERM GOAL #1   Title Patient to show improvement in muscle strength as having improved by at least 1 MMT grade in all  tested groups in order to show improved gait and balance skills    Time 8   Period Weeks   Status New     PT LONG TERM GOAL #2   Title Patient to be able to ambulate community distances with LRAD and household distances with no device and consistent heel-toe pattern with equal stance times/step lengths in order to improve safe mobility and community access    Time 8   Period Weeks   Status New     PT LONG TERM GOAL #3   Title Patient to be able to complete TUG test within 12 seconds with LRAD and will also be able to maintain SLS for at least 20 seconds on solid surface each LE in order to show improved mobility and balance    Time 8   Period Weeks   Status New     PT LONG TERM GOAL #4   Title Patient to be able to ascend/descend full flight of stairs with U railing, minimal unsteadiness in order to show improved ankle ROM and muscle control for safe home/community navigation    Time 8   Period Weeks   Status New     PT LONG TERM GOAL #5   Title Patient to report she has been able to return to all PLOF based tasks with minimal unsteadiness and no pain increase in order to improve QOL    Time 8   Period Weeks   Status New               Plan - 10/23/16 1513    Clinical Impression Statement Patient arrives today stating she is doing well. Examined open wound area on posterior heel, which appears to have less redness and reduced temperature; recommended that she looked into some open-backed tennis shoes that will relieve pressure/shear from this area to allow it to heal further. Otherwise continued with manual techniques and exercises/activities for ROM and ankle/foot flexibility today. Of note, she continues to demonstrate significant difficulty with heel-toe pattern even with walker use.    Rehab Potential Good   Clinical Impairments Affecting Rehab Potential (+) high functional level prior to injury, motivated to attempt to return to PLOF; (-) chronicity of current  impairment/surgical intervention with regression since surgery per referring MD for this impairment  PT Frequency 2x / week   PT Duration 8 weeks   PT Treatment/Interventions Biofeedback;Cryotherapy;Electrical Stimulation;Moist Heat;Ultrasound;DME Instruction;Gait training;Stair training;Functional mobility training;Therapeutic activities;Therapeutic exercise;Balance training;Neuromuscular re-education;Patient/family education;Orthotic Fit/Training;Manual techniques;Scar mobilization;Passive range of motion;Dry needling;Taping;ADLs/Self Care Home Management   PT Next Visit Plan continue tocheck area on back of L heel. F/U on open backed shoes. Continue with manual for ROM, OKC and CKC moblity exericse. Continue gait with walker and trial with quad cane if pt able to demo good carry-over of heel to toe from previous session; continue standing calf stretch and MWM for ankle DF; continue manual with IASTM tool; trial SLS for ankle stability   PT Home Exercise Plan Eval: gastroc stretch, supine ankle PF/DF and supination, ankle circles, heel-toe walking    Consulted and Agree with Plan of Care Patient      Patient will benefit from skilled therapeutic intervention in order to improve the following deficits and impairments:  Abnormal gait, Decreased skin integrity, Increased fascial restricitons, Decreased coordination, Decreased mobility, Decreased range of motion, Decreased strength, Hypomobility, Decreased balance, Difficulty walking, Impaired flexibility  Visit Diagnosis: Stiffness of left ankle, not elsewhere classified  Other abnormalities of gait and mobility  Contracture of muscle, multiple sites  Difficulty in walking, not elsewhere classified  Muscle weakness (generalized)     Problem List Patient Active Problem List   Diagnosis Date Noted  . Achilles tendon contracture, left 09/16/2016  . Flexion contracture of joint of left lower leg 08/25/2016  . Avulsion fracture of  calcaneus with routine healing, left 08/25/2016    Deniece Ree PT, DPT [facility] [address] [place], [name], [address] Phone: [phone]   Fax:  [phone]  Name: Paige Glass MRN: [id number] Date of Birth: 1940-12-12

## 2016-10-27 ENCOUNTER — Ambulatory Visit (HOSPITAL_COMMUNITY): Payer: PPO | Admitting: Physical Therapy

## 2016-10-27 DIAGNOSIS — M25672 Stiffness of left ankle, not elsewhere classified: Secondary | ICD-10-CM | POA: Diagnosis not present

## 2016-10-27 DIAGNOSIS — R262 Difficulty in walking, not elsewhere classified: Secondary | ICD-10-CM

## 2016-10-27 DIAGNOSIS — M6249 Contracture of muscle, multiple sites: Secondary | ICD-10-CM

## 2016-10-27 DIAGNOSIS — R2689 Other abnormalities of gait and mobility: Secondary | ICD-10-CM

## 2016-10-27 DIAGNOSIS — M6281 Muscle weakness (generalized): Secondary | ICD-10-CM

## 2016-10-27 NOTE — Therapy (Signed)
[facility] [address] [place], [name], [address] Phone: [phone]   Fax:  [phone]  Physical Therapy Treatment  Patient Details  Name: Paige Glass MRN: [id number] Date of Birth: 09/12/1940 Referring Provider: Newt Minion   Encounter Date: 10/27/2016      PT End of Session - 10/27/16 1531    Visit Number 7   Number of Visits 17   Date for PT Re-Evaluation 11/03/16   Authorization Type [employer]    Authorization Time Period 10/06/16 to 12/06/16   Authorization - Visit Number 7   Authorization - Number of Visits 10   PT Start Time 5465   PT Stop Time 1510   PT Time Calculation (min) 39 min   Activity Tolerance Patient tolerated treatment well   Behavior During Therapy [facility] for tasks assessed/performed      Past Medical History:  Diagnosis Date  . Back pain   . Joint pain   . Joint swelling   . Nocturia   . Osteoporosis     Past Surgical History:  Procedure Laterality Date  . bilateral ear surgery    . OPEN REDUCTION INTERNAL FIXATION (ORIF) FOOT LISFRANC FRACTURE Left 07/14/2016   Procedure: OPEN REDUCTION INTERNAL FIXATION (ORIF) FOOT LISFRANC FRACTURE;  Surgeon: Meredith Pel, MD;  Location: [facility];  Service: Orthopedics;  Laterality: Left;  . TRIGGER FINGER RELEASE Right     There were no vitals filed for this visit.      Subjective Assessment - 10/27/16 1434    Subjective Patient arrives stating nothing new, she has been wearing slip on bedroom shoes at home but they do not have enough support to help her do any good. Her daughter is looking for some new shoes for her that are more opened back.    Pertinent History avulsion fracture L heel, achilles contracture, osteoporosis, no other significant PMH    Patient Stated Goals walk normally, get back to prior level of activity and take care of herself    Currently in Pain? No/denies                         [facility] Adult PT  Treatment/Exercise - 10/27/16 0001      Ambulation/Gait   Gait Comments gait trainign for consistent heel strike with walker, cues for correct form     Manual Therapy   Manual Therapy Soft tissue mobilization   Manual therapy comments separate from all other skilled services    Soft tissue mobilization scar massage around open sore; deep tissue massage gastroc/soleus/achilles     Ankle Exercises: Stretches   Soleus Stretch 3 reps;30 seconds   Gastroc Stretch 5 reps;30 seconds     Ankle Exercises: Seated   Towel Crunch --  attempted, unable    Other Seated Ankle Exercises arch raises 1x20    Other Seated Ankle Exercises rockerboard PF/DF x2 minutes                 PT Education - 10/27/16 1531    Education provided No          PT Short Term Goals - 10/06/16 1748      PT SHORT TERM GOAL #1   Title Patient to demonstrate L ankle ROM as being within 8 degrees of that of R on all measured planes in order to improve mechanics and symmetry of gait pattern    Time 4   Period Weeks  Status New     PT SHORT TERM GOAL #2   Title Patient to consistently be using heel-toe gait pattern with walker in order to improve efficiency of gait and promote normalized ankle mechancis    Time 4   Period Weeks   Status New     PT SHORT TERM GOAL #3   Title Patient to be able to verbalize proper skin care and shoe fit principles in order to prevent worsening or formation of another area of skin breakdown on posterior L ankle    Time 4   Period Weeks   Status New     PT SHORT TERM GOAL #4   Title Patient to corrrectly and consistently perform appropriate HEP, to be updated PRN    Time 1   Period Weeks   Status New           PT Long Term Goals - 10/06/16 1750      PT LONG TERM GOAL #1   Title Patient to show improvement in muscle strength as having improved by at least 1 MMT grade in all tested groups in order to show improved gait and balance skills    Time 8   Period  Weeks   Status New     PT LONG TERM GOAL #2   Title Patient to be able to ambulate community distances with LRAD and household distances with no device and consistent heel-toe pattern with equal stance times/step lengths in order to improve safe mobility and community access    Time 8   Period Weeks   Status New     PT LONG TERM GOAL #3   Title Patient to be able to complete TUG test within 12 seconds with LRAD and will also be able to maintain SLS for at least 20 seconds on solid surface each LE in order to show improved mobility and balance    Time 8   Period Weeks   Status New     PT LONG TERM GOAL #4   Title Patient to be able to ascend/descend full flight of stairs with U railing, minimal unsteadiness in order to show improved ankle ROM and muscle control for safe home/community navigation    Time 8   Period Weeks   Status New     PT LONG TERM GOAL #5   Title Patient to report she has been able to return to all PLOF based tasks with minimal unsteadiness and no pain increase in order to improve QOL    Time 8   Period Weeks   Status New               Plan - 10/27/16 1532    Clinical Impression Statement Continued to monitor open area on posterior Achilles, noting general measures as 0.4x0.4cm and will continue to monitor; it is tender but does not have significant elevated temperature as well as only a small amount of redness around it. Otherwise continued with manual to L ankle as well as functional stretches and exercises focused on mobility and heel-strike during gait. Unsure of correct performance of HEP at home as patient did demonstrate incorrect form with one exercise she has been doing today.    Rehab Potential Good   Clinical Impairments Affecting Rehab Potential (+) high functional level prior to injury, motivated to attempt to return to PLOF; (-) chronicity of current impairment/surgical intervention with regression since surgery per referring MD for this  impairment    PT Frequency 2x / week  PT Duration 8 weeks   PT Treatment/Interventions Biofeedback;Cryotherapy;Electrical Stimulation;Moist Heat;Ultrasound;DME Instruction;Gait training;Stair training;Functional mobility training;Therapeutic activities;Therapeutic exercise;Balance training;Neuromuscular re-education;Patient/family education;Orthotic Fit/Training;Manual techniques;Scar mobilization;Passive range of motion;Dry needling;Taping;ADLs/Self Care Home Management   PT Next Visit Plan continue tocheck area on back of L heel. F/U on open backed shoes. Continue with manual for ROM, OKC and CKC moblity exericse. Continue gait with walker and trial with quad cane if pt able to demo good carry-over of heel to toe from previous session; continue standing calf stretch and MWM for ankle DF; continue manual with IASTM tool; trial SLS for ankle stability   PT Home Exercise Plan Eval: gastroc stretch, supine ankle PF/DF and supination, ankle circles, heel-toe walking    Consulted and Agree with Plan of Care Patient      Patient will benefit from skilled therapeutic intervention in order to improve the following deficits and impairments:  Abnormal gait, Decreased skin integrity, Increased fascial restricitons, Decreased coordination, Decreased mobility, Decreased range of motion, Decreased strength, Hypomobility, Decreased balance, Difficulty walking, Impaired flexibility  Visit Diagnosis: Stiffness of left ankle, not elsewhere classified  Other abnormalities of gait and mobility  Contracture of muscle, multiple sites  Difficulty in walking, not elsewhere classified  Muscle weakness (generalized)     Problem List Patient Active Problem List   Diagnosis Date Noted  . Achilles tendon contracture, left 09/16/2016  . Flexion contracture of joint of left lower leg 08/25/2016  . Avulsion fracture of calcaneus with routine healing, left 08/25/2016    Deniece Ree PT, DPT [facility] [address] [place], [name], [address] Phone: [phone]   Fax:  [phone]  Name: Narissa Beaufort Nishiyama MRN: [id number] Date of Birth: [birth year]

## 2016-10-30 ENCOUNTER — Ambulatory Visit (HOSPITAL_COMMUNITY): Payer: PPO | Admitting: Physical Therapy

## 2016-10-30 DIAGNOSIS — M25672 Stiffness of left ankle, not elsewhere classified: Secondary | ICD-10-CM | POA: Diagnosis not present

## 2016-10-30 DIAGNOSIS — R2689 Other abnormalities of gait and mobility: Secondary | ICD-10-CM

## 2016-10-30 DIAGNOSIS — M6249 Contracture of muscle, multiple sites: Secondary | ICD-10-CM

## 2016-10-30 DIAGNOSIS — M6281 Muscle weakness (generalized): Secondary | ICD-10-CM

## 2016-10-30 DIAGNOSIS — R262 Difficulty in walking, not elsewhere classified: Secondary | ICD-10-CM

## 2016-10-30 NOTE — Therapy (Signed)
[facility] [address] [place], [name], [address] Phone: [phone]   Fax:  [phone]  Physical Therapy Treatment  Patient Details  Name: Paige Glass MRN: [id number] Date of Birth: November 15, 1940 Referring Provider: Newt Minion   Encounter Date: 10/30/2016      PT End of Session - 10/30/16 1527    Visit Number 8   Number of Visits 17   Date for PT Re-Evaluation 11/03/16   Authorization Type [employer]    Authorization Time Period 10/06/16 to 12/06/16   Authorization - Visit Number 8   Authorization - Number of Visits 10   PT Start Time 5003   PT Stop Time 1515   PT Time Calculation (min) 40 min   Activity Tolerance Patient tolerated treatment well   Behavior During Therapy [facility] for tasks assessed/performed      Past Medical History:  Diagnosis Date  . Back pain   . Joint pain   . Joint swelling   . Nocturia   . Osteoporosis     Past Surgical History:  Procedure Laterality Date  . bilateral ear surgery    . OPEN REDUCTION INTERNAL FIXATION (ORIF) FOOT LISFRANC FRACTURE Left 07/14/2016   Procedure: OPEN REDUCTION INTERNAL FIXATION (ORIF) FOOT LISFRANC FRACTURE;  Surgeon: Meredith Pel, MD;  Location: [facility];  Service: Orthopedics;  Laterality: Left;  . TRIGGER FINGER RELEASE Right     There were no vitals filed for this visit.      Subjective Assessment - 10/30/16 1440    Subjective Pt states she is ready to walk.  States her heel scab is looking better than it was.  States she is tight but has no pain.   Currently in Pain? No/denies                         [facility] Adult PT Treatment/Exercise - 10/30/16 0001      Ambulation/Gait   Gait Comments Gait with SPC 226 feet     Manual Therapy   Manual Therapy Soft tissue mobilization;Myofascial release   Manual therapy comments completed in prone; separate from all other skilled services    Soft tissue mobilization scar massage around open  sore; deep tissue massage gastroc/soleus/achilles   Myofascial Release to adhesions scar and perimeter of scar     Ankle Exercises: Stretches   Plantar Fascia Stretch 3 reps;30 seconds   Gastroc Stretch 3 reps;30 seconds     Ankle Exercises: Standing   Rocker Board 2 minutes   Other Standing Ankle Exercises A/P and Rt/Lt                PT Education - 10/30/16 1526    Education provided Yes   Education Details Continue aggressive stretching of ankle and heel toe gait   Person(s) Educated Patient   Methods Explanation;Demonstration   Comprehension Verbalized understanding          PT Short Term Goals - 10/06/16 1748      PT SHORT TERM GOAL #1   Title Patient to demonstrate L ankle ROM as being within 8 degrees of that of R on all measured planes in order to improve mechanics and symmetry of gait pattern    Time 4   Period Weeks   Status New     PT SHORT TERM GOAL #2   Title Patient to consistently be using heel-toe gait pattern with walker in order to improve efficiency of gait  and promote normalized ankle mechancis    Time 4   Period Weeks   Status New     PT SHORT TERM GOAL #3   Title Patient to be able to verbalize proper skin care and shoe fit principles in order to prevent worsening or formation of another area of skin breakdown on posterior L ankle    Time 4   Period Weeks   Status New     PT SHORT TERM GOAL #4   Title Patient to corrrectly and consistently perform appropriate HEP, to be updated PRN    Time 1   Period Weeks   Status New           PT Long Term Goals - 10/06/16 1750      PT LONG TERM GOAL #1   Title Patient to show improvement in muscle strength as having improved by at least 1 MMT grade in all tested groups in order to show improved gait and balance skills    Time 8   Period Weeks   Status New     PT LONG TERM GOAL #2   Title Patient to be able to ambulate community distances with LRAD and household distances with no device  and consistent heel-toe pattern with equal stance times/step lengths in order to improve safe mobility and community access    Time 8   Period Weeks   Status New     PT LONG TERM GOAL #3   Title Patient to be able to complete TUG test within 12 seconds with LRAD and will also be able to maintain SLS for at least 20 seconds on solid surface each LE in order to show improved mobility and balance    Time 8   Period Weeks   Status New     PT LONG TERM GOAL #4   Title Patient to be able to ascend/descend full flight of stairs with U railing, minimal unsteadiness in order to show improved ankle ROM and muscle control for safe home/community navigation    Time 8   Period Weeks   Status New     PT LONG TERM GOAL #5   Title Patient to report she has been able to return to all PLOF based tasks with minimal unsteadiness and no pain increase in order to improve QOL    Time 8   Period Weeks   Status New               Plan - 10/30/16 1623    Clinical Impression Statement Increased weight bearing actvity this session adding SLS, rockerboard, stretches and gait using SPC.  Pt with better gait using SPC as opposed to walker due to elimination of limb and completing heel-toe gait.  Noted fatigue with return of antalgia following approx 150 feet of gait.  Cues for posturing during gait but none with sequencing.  Scab area removed from scar line during manual  revealing complete closure.  no wound is present.  Passive completed for DF and myofascial techniques added to loosen adhesions.  Pt with good overall tolerance to manual.  no increased pain at end of session .   Rehab Potential Good   Clinical Impairments Affecting Rehab Potential (+) high functional level prior to injury, motivated to attempt to return to PLOF; (-) chronicity of current impairment/surgical intervention with regression since surgery per referring MD for this impairment    PT Frequency 2x / week   PT Duration 8 weeks   PT  Treatment/Interventions  Biofeedback;Cryotherapy;Electrical Stimulation;Moist Heat;Ultrasound;DME Instruction;Gait training;Stair training;Functional mobility training;Therapeutic activities;Therapeutic exercise;Balance training;Neuromuscular re-education;Patient/family education;Orthotic Fit/Training;Manual techniques;Scar mobilization;Passive range of motion;Dry needling;Taping;ADLs/Self Care Home Management   PT Next Visit Plan Continue with manual for ROM, OKC and CKC moblity exericse. Continue gait with SPC and increase in WB/standing exercises.    PT Home Exercise Plan Eval: gastroc stretch, supine ankle PF/DF and supination, ankle circles, heel-toe walking    Consulted and Agree with Plan of Care Patient      Patient will benefit from skilled therapeutic intervention in order to improve the following deficits and impairments:  Abnormal gait, Decreased skin integrity, Increased fascial restricitons, Decreased coordination, Decreased mobility, Decreased range of motion, Decreased strength, Hypomobility, Decreased balance, Difficulty walking, Impaired flexibility  Visit Diagnosis: Stiffness of left ankle, not elsewhere classified  Other abnormalities of gait and mobility  Contracture of muscle, multiple sites  Difficulty in walking, not elsewhere classified  Muscle weakness (generalized)     Problem List Patient Active Problem List   Diagnosis Date Noted  . Achilles tendon contracture, left 09/16/2016  . Flexion contracture of joint of left lower leg 08/25/2016  . Avulsion fracture of calcaneus with routine healing, left 08/25/2016   Teena Irani, PTA/CLT [phone]  Teena Irani 10/30/2016, 4:28 PM  [facility] [place], [name], [address] Phone: [phone]   Fax:  [phone]  Name: Paige Glass MRN: [id number] Date of Birth: [birth year]

## 2016-11-03 ENCOUNTER — Ambulatory Visit (HOSPITAL_COMMUNITY): Payer: PPO | Admitting: Physical Therapy

## 2016-11-03 DIAGNOSIS — M6249 Contracture of muscle, multiple sites: Secondary | ICD-10-CM

## 2016-11-03 DIAGNOSIS — M25672 Stiffness of left ankle, not elsewhere classified: Secondary | ICD-10-CM

## 2016-11-03 DIAGNOSIS — M6281 Muscle weakness (generalized): Secondary | ICD-10-CM

## 2016-11-03 DIAGNOSIS — R262 Difficulty in walking, not elsewhere classified: Secondary | ICD-10-CM

## 2016-11-03 DIAGNOSIS — R2689 Other abnormalities of gait and mobility: Secondary | ICD-10-CM

## 2016-11-03 NOTE — Therapy (Signed)
[facility] [address] [place], [name], [address] Phone: [phone]   Fax:  [phone]  Physical Therapy Treatment (Re-Assessment)  Patient Details  Name: Paige Glass MRN: [id number] Date of Birth: 04-Mar-1941 Referring Provider: Newt Minion   Encounter Date: 11/03/2016      PT End of Session - 11/03/16 1527    Visit Number 9   Number of Visits 17   Date for PT Re-Evaluation 12/01/16   Authorization Type [employer]  (G-codes done 9th session)   Authorization Time Period 10/06/16 to 12/06/16   Authorization - Visit Number 9   Authorization - Number of Visits 19   PT Start Time 8270   PT Stop Time 1512   PT Time Calculation (min) 38 min   Activity Tolerance Patient tolerated treatment well   Behavior During Therapy [facility] for tasks assessed/performed      Past Medical History:  Diagnosis Date  . Back pain   . Joint pain   . Joint swelling   . Nocturia   . Osteoporosis     Past Surgical History:  Procedure Laterality Date  . bilateral ear surgery    . OPEN REDUCTION INTERNAL FIXATION (ORIF) FOOT LISFRANC FRACTURE Left 07/14/2016   Procedure: OPEN REDUCTION INTERNAL FIXATION (ORIF) FOOT LISFRANC FRACTURE;  Surgeon: Meredith Pel, MD;  Location: [facility];  Service: Orthopedics;  Laterality: Left;  . TRIGGER FINGER RELEASE Right     There were no vitals filed for this visit.      Subjective Assessment - 11/03/16 1436    Subjective patient arrives stating that she is doing well, she can twist her foot more and she feels like she has come a long way. It is still hard for her to put all of her weight on her foot, her heel is still sore.    Pertinent History avulsion fracture L heel, achilles contracture, osteoporosis, no other significant PMH    How long can you sit comfortably? 7/23- unlimited    How long can you stand comfortably? 7/23- unlimited    How long can you walk comfortably? 7/23- 229f with walker,  maybe a couple hundred feet with week    Patient Stated Goals walk normally, get back to prior level of activity and take care of herself    Currently in Pain? No/denies            [facility] 11[facility]16 0001      AROM   Right Ankle Dorsiflexion 13   Right Ankle Plantar Flexion 65   Right Ankle Inversion 48   Right Ankle Eversion 15   Left Ankle Dorsiflexion -4   Left Ankle Plantar Flexion 57   Left Ankle Inversion 16   Left Ankle Eversion 6     Strength   Right Ankle Dorsiflexion 5/5   Right Ankle Inversion 5/5   Right Ankle Eversion 5/5   Left Ankle Dorsiflexion 4+/5  ROM limited    Left Ankle Inversion 4+/5  ROM limited    Left Ankle Eversion 3-/5  ROM limited                      [facility] Adult PT Treatment/Exercise - 11/03/16 0001      Ambulation/Gait   Gait Comments Gait with quad cane and SPC [id number] for posture and step lenghts                 PT Education - 11/03/16  1526    Education provided Yes   Education Details progress thus far, POC moving forward; get personal [employer] for use in home and bring to clinic for sizing    Person(s) Educated Patient   Methods Explanation   Comprehension Verbalized understanding          PT Short Term Goals - 11/03/16 1450      PT SHORT TERM GOAL #1   Title Patient to demonstrate L ankle ROM as being within 8 degrees of that of R on all measured planes in order to improve mechanics and symmetry of gait pattern    Baseline 7/23- L ankle remains very stiff    Time 4   Period Weeks   Status On-going     PT SHORT TERM GOAL #2   Title Patient to consistently be using heel-toe gait pattern with walker in order to improve efficiency of gait and promote normalized ankle mechancis    Baseline 7/23- pateint reports "this is better", inconsistent in clinic    Time 4   Period Weeks   Status On-going     PT SHORT TERM GOAL #3   Title Patient to be able to verbalize proper skin care and shoe fit  principles in order to prevent worsening or formation of another area of skin breakdown on posterior L ankle    Baseline 7/23- "neosporin, open back shoes"    Time 4   Period Weeks   Status On-going     PT SHORT TERM GOAL #4   Title Patient to corrrectly and consistently perform appropriate HEP, to be updated PRN    Baseline 7/23- reports she does HEP 3-4 times per day, PT unsure of form    Time 1   Period Weeks   Status Partially Met           PT Long Term Goals - 11/03/16 1454      PT LONG TERM GOAL #1   Title Patient to show improvement in muscle strength as having improved by at least 1 MMT grade in all tested groups in order to show improved gait and balance skills    Baseline 7/23- improving but continues to be limited by stiffness    Time 8   Period Weeks   Status On-going     PT LONG TERM GOAL #2   Title Patient to be able to ambulate community distances with LRAD and household distances with no device and consistent heel-toe pattern with equal stance times/step lengths in order to improve safe mobility and community access    Baseline 7/23- ongonig    Time 8   Period Weeks   Status On-going     PT LONG TERM GOAL #3   Title Patient to be able to complete TUG test within 12 seconds with LRAD and will also be able to maintain SLS for at least 20 seconds on solid surface each LE in order to show improved mobility and balance    Baseline 7/23- DNT    Time 8   Period Weeks   Status On-going     PT LONG TERM GOAL #4   Title Patient to be able to ascend/descend full flight of stairs with U railing, minimal unsteadiness in order to show improved ankle ROM and muscle control for safe home/community navigation    Time 8   Period Weeks   Status On-going     PT LONG TERM GOAL #5   Title Patient to report she has been able to  return to all PLOF based tasks with minimal unsteadiness and no pain increase in order to improve QOL    Baseline 11-24-22- ongiong    Time 8   Period  Weeks   Status On-going               Plan - Nov 23, 2016 1528    Clinical Impression Statement Re-assessment performed today. Patient states that she feels she is getting better, it is easier for her to move her foot and she has been keeping up with her HEP however PT remains unsure on accuracy of form at home. Examination reveals slow progress in general with objective measures, however patient does remain motivated to continue with skilled PT services. Gait training provided today with SPC and quad cane; pattern is slightly antalgic but steady and recommended that patient get personal cane to practice on her own in her home. Recommend continuation of skilled PT services to continue to attempt to address functional deficits, improve overall gait pattern, and reduce fall risk.    Rehab Potential Good   Clinical Impairments Affecting Rehab Potential (+) high functional level prior to injury, motivated to attempt to return to PLOF; (-) chronicity of current impairment/surgical intervention with regression since surgery per referring MD for this impairment    PT Frequency 2x / week   PT Duration 4 weeks   PT Treatment/Interventions Biofeedback;Cryotherapy;Electrical Stimulation;Moist Heat;Ultrasound;DME Instruction;Gait training;Stair training;Functional mobility training;Therapeutic activities;Therapeutic exercise;Balance training;Neuromuscular re-education;Patient/family education;Orthotic Fit/Training;Manual techniques;Scar mobilization;Passive range of motion;Dry needling;Taping;ADLs/Self Care Home Management   PT Next Visit Plan continue with manual and mobility exercise; increase focus on balance, stairs, SPC use    PT Home Exercise Plan Eval: gastroc stretch, supine ankle PF/DF and supination, ankle circles, heel-toe walking    Consulted and Agree with Plan of Care Patient      Patient will benefit from skilled therapeutic intervention in order to improve the following deficits and  impairments:  Abnormal gait, Decreased skin integrity, Increased fascial restricitons, Decreased coordination, Decreased mobility, Decreased range of motion, Decreased strength, Hypomobility, Decreased balance, Difficulty walking, Impaired flexibility  Visit Diagnosis: Stiffness of left ankle, not elsewhere classified  Other abnormalities of gait and mobility  Contracture of muscle, multiple sites  Difficulty in walking, not elsewhere classified  Muscle weakness (generalized)       G-Codes - 11-23-16 1530    Functional Assessment Tool Used (Outpatient Only) Based on skilled clinical assessment of ROM, gait, strength, general balance and mobility    Functional Limitation Mobility: Walking and moving around   Mobility: Walking and Moving Around Current Status (J4970) At least 40 percent but less than 60 percent impaired, limited or restricted   Mobility: Walking and Moving Around Goal Status (Y6378) At least 20 percent but less than 40 percent impaired, limited or restricted      Problem List Patient Active Problem List   Diagnosis Date Noted  . Achilles tendon contracture, left 09/16/2016  . Flexion contracture of joint of left lower leg 08/25/2016  . Avulsion fracture of calcaneus with routine healing, left 08/25/2016    Deniece Ree PT, DPT [facility] [address] [place], [name], [address] Phone: [phone]   Fax:  [phone]  Name: Paige Glass MRN: [id number] Date of Birth: October 30, 1940

## 2016-11-06 ENCOUNTER — Ambulatory Visit (HOSPITAL_COMMUNITY): Payer: PPO | Admitting: Physical Therapy

## 2016-11-06 DIAGNOSIS — R262 Difficulty in walking, not elsewhere classified: Secondary | ICD-10-CM

## 2016-11-06 DIAGNOSIS — M25672 Stiffness of left ankle, not elsewhere classified: Secondary | ICD-10-CM

## 2016-11-06 DIAGNOSIS — R2689 Other abnormalities of gait and mobility: Secondary | ICD-10-CM

## 2016-11-06 DIAGNOSIS — M6281 Muscle weakness (generalized): Secondary | ICD-10-CM

## 2016-11-06 DIAGNOSIS — M6249 Contracture of muscle, multiple sites: Secondary | ICD-10-CM

## 2016-11-06 NOTE — Therapy (Signed)
[facility] [address] [place], [name], [address] Phone: [phone]   Fax:  [phone]  Physical Therapy Treatment  Patient Details  Name: Paige Glass MRN: [id number] Date of Birth: [birth year] Referring Provider: Newt Minion   Encounter Date: 11/06/2016      PT End of Session - 11/06/16 1527    Visit Number 10   Number of Visits 17   Date for PT Re-Evaluation 12/01/16   Authorization Type [employer]  (G-codes done 9th session)   Authorization Time Period 10/06/16 to 12/06/16   Authorization - Visit Number 10   Authorization - Number of Visits 19   PT Start Time 5456   PT Stop Time 1513   PT Time Calculation (min) 40 min   Activity Tolerance Patient tolerated treatment well   Behavior During Therapy [facility] for tasks assessed/performed      Past Medical History:  Diagnosis Date  . Back pain   . Joint pain   . Joint swelling   . Nocturia   . Osteoporosis     Past Surgical History:  Procedure Laterality Date  . bilateral ear surgery    . OPEN REDUCTION INTERNAL FIXATION (ORIF) FOOT LISFRANC FRACTURE Left 07/14/2016   Procedure: OPEN REDUCTION INTERNAL FIXATION (ORIF) FOOT LISFRANC FRACTURE;  Surgeon: Meredith Pel, MD;  Location: [facility];  Service: Orthopedics;  Laterality: Left;  . TRIGGER FINGER RELEASE Right     There were no vitals filed for this visit.      Subjective Assessment - 11/06/16 1435    Subjective Patient arrives stating she is doing well, she got a personal large base quad cane but isn't sure if its the right one/type    Pertinent History avulsion fracture L heel, achilles contracture, osteoporosis, no other significant PMH    Patient Stated Goals walk normally, get back to prior level of activity and take care of herself    Currently in Pain? No/denies                         [facility] Adult PT Treatment/Exercise - 11/06/16 0001      Ambulation/Gait   Gait Comments gait  with large base quad cane, min guard to S; outdoors over uneven surfaces, stairs; noted difficulty with toe pushoff and consistency of step lenghts/heel-toe     Manual Therapy   Manual Therapy Soft tissue mobilization   Manual therapy comments completed in prone; separate from all other skilled services    Soft tissue mobilization STM to gastroc/scar massage posteior L ankle              Balance Exercises - 11/06/16 1522      Balance Exercises: Standing   Tandem Stance Eyes open;3 reps;15 secs   SLS Eyes open;Solid surface;10 secs;2 reps           PT Education - 11/06/16 1526    Education provided Yes   Education Details only use LB[employer] inside, use walker over outside surfaces for safety    Person(s) Educated Patient   Methods Explanation   Comprehension Verbalized understanding          PT Short Term Goals - 11/03/16 1450      PT SHORT TERM GOAL #1   Title Patient to demonstrate L ankle ROM as being within 8 degrees of that of R on all measured planes in order to improve mechanics and symmetry of gait pattern  Baseline 7/23- L ankle remains very stiff    Time 4   Period Weeks   Status On-going     PT SHORT TERM GOAL #2   Title Patient to consistently be using heel-toe gait pattern with walker in order to improve efficiency of gait and promote normalized ankle mechancis    Baseline 7/23- pateint reports "this is better", inconsistent in clinic    Time 4   Period Weeks   Status On-going     PT SHORT TERM GOAL #3   Title Patient to be able to verbalize proper skin care and shoe fit principles in order to prevent worsening or formation of another area of skin breakdown on posterior L ankle    Baseline 7/23- "neosporin, open back shoes"    Time 4   Period Weeks   Status On-going     PT SHORT TERM GOAL #4   Title Patient to corrrectly and consistently perform appropriate HEP, to be updated PRN    Baseline 7/23- reports she does HEP 3-4 times per day, PT unsure  of form    Time 1   Period Weeks   Status Partially Met           PT Long Term Goals - 11/03/16 1454      PT LONG TERM GOAL #1   Title Patient to show improvement in muscle strength as having improved by at least 1 MMT grade in all tested groups in order to show improved gait and balance skills    Baseline 7/23- improving but continues to be limited by stiffness    Time 8   Period Weeks   Status On-going     PT LONG TERM GOAL #2   Title Patient to be able to ambulate community distances with LRAD and household distances with no device and consistent heel-toe pattern with equal stance times/step lengths in order to improve safe mobility and community access    Baseline 7/23- ongonig    Time 8   Period Weeks   Status On-going     PT LONG TERM GOAL #3   Title Patient to be able to complete TUG test within 12 seconds with LRAD and will also be able to maintain SLS for at least 20 seconds on solid surface each LE in order to show improved mobility and balance    Baseline 7/23- DNT    Time 8   Period Weeks   Status On-going     PT LONG TERM GOAL #4   Title Patient to be able to ascend/descend full flight of stairs with U railing, minimal unsteadiness in order to show improved ankle ROM and muscle control for safe home/community navigation    Time 8   Period Weeks   Status On-going     PT LONG TERM GOAL #5   Title Patient to report she has been able to return to all PLOF based tasks with minimal unsteadiness and no pain increase in order to improve QOL    Baseline 7/23- ongiong    Time 8   Period Weeks   Status On-going               Plan - 11/06/16 1543    Clinical Impression Statement Spent quite a bit of time on gait training today with patient's new LBQC, going outside and over uneven surfaces such as grass and gravel; she is able to ambulate with S on level surfaces but does require min guard over uneven surfaces and gravel.  Performed STM to L gastroc as well as  sscar massage and finished session with balance training.    Rehab Potential Good   Clinical Impairments Affecting Rehab Potential (+) high functional level prior to injury, motivated to attempt to return to PLOF; (-) chronicity of current impairment/surgical intervention with regression since surgery per referring MD for this impairment    PT Frequency 2x / week   PT Duration 4 weeks   PT Treatment/Interventions Biofeedback;Cryotherapy;Electrical Stimulation;Moist Heat;Ultrasound;DME Instruction;Gait training;Stair training;Functional mobility training;Therapeutic activities;Therapeutic exercise;Balance training;Neuromuscular re-education;Patient/family education;Orthotic Fit/Training;Manual techniques;Scar mobilization;Passive range of motion;Dry needling;Taping;ADLs/Self Care Home Management   PT Next Visit Plan continue manual and mobility, stair training with cane, balance. Update HEP with balance exercise    PT Home Exercise Plan Eval: gastroc stretch, supine ankle PF/DF and supination, ankle circles, heel-toe walking    Consulted and Agree with Plan of Care Patient      Patient will benefit from skilled therapeutic intervention in order to improve the following deficits and impairments:  Abnormal gait, Decreased skin integrity, Increased fascial restricitons, Decreased coordination, Decreased mobility, Decreased range of motion, Decreased strength, Hypomobility, Decreased balance, Difficulty walking, Impaired flexibility  Visit Diagnosis: Stiffness of left ankle, not elsewhere classified  Other abnormalities of gait and mobility  Contracture of muscle, multiple sites  Difficulty in walking, not elsewhere classified  Muscle weakness (generalized)     Problem List Patient Active Problem List   Diagnosis Date Noted  . Achilles tendon contracture, left 09/16/2016  . Flexion contracture of joint of left lower leg 08/25/2016  . Avulsion fracture of calcaneus with routine healing,  left 08/25/2016     Deniece Ree PT, DPT [facility] [address] [place], [name], [address] Phone: [phone]   Fax:  [phone]  Name: Fabiha Rougeau [place]ham MRN: [id number] Date of Birth: 01/09/1941

## 2016-11-10 ENCOUNTER — Ambulatory Visit (HOSPITAL_COMMUNITY): Payer: PPO | Admitting: Physical Therapy

## 2016-11-10 DIAGNOSIS — M25672 Stiffness of left ankle, not elsewhere classified: Secondary | ICD-10-CM

## 2016-11-10 DIAGNOSIS — M6249 Contracture of muscle, multiple sites: Secondary | ICD-10-CM

## 2016-11-10 DIAGNOSIS — R262 Difficulty in walking, not elsewhere classified: Secondary | ICD-10-CM

## 2016-11-10 DIAGNOSIS — M6281 Muscle weakness (generalized): Secondary | ICD-10-CM

## 2016-11-10 DIAGNOSIS — R2689 Other abnormalities of gait and mobility: Secondary | ICD-10-CM

## 2016-11-10 NOTE — Therapy (Signed)
[place], [name], [address] Phone: [phone]   Fax:  [phone]  Physical Therapy Treatment  Patient Details  Name: Paige Glass MRN: [id number] Date of Birth: Jan 20, 1941 Referring Provider: Newt Minion   Encounter Date: 11/10/2016      PT End of Session - 11/10/16 1739    Visit Number 11   Number of Visits 17   Date for PT Re-Evaluation 12/01/16   Authorization Type [employer]  (G-codes done 9th session)   Authorization Time Period 10/06/16 to 12/06/16   Authorization - Visit Number 11   Authorization - Number of Visits 19   PT Start Time 4481   PT Stop Time 1511   PT Time Calculation (min) 38 min   Activity Tolerance Patient tolerated treatment well   Behavior During Therapy [facility] for tasks assessed/performed      Past Medical History:  Diagnosis Date  . Back pain   . Joint pain   . Joint swelling   . Nocturia   . Osteoporosis     Past Surgical History:  Procedure Laterality Date  . bilateral ear surgery    . OPEN REDUCTION INTERNAL FIXATION (ORIF) FOOT LISFRANC FRACTURE Left 07/14/2016   Procedure: OPEN REDUCTION INTERNAL FIXATION (ORIF) FOOT LISFRANC FRACTURE;  Surgeon: Meredith Pel, MD;  Location: [place];  Service: Orthopedics;  Laterality: Left;  . TRIGGER FINGER RELEASE Right     There were no vitals filed for this visit.      Subjective Assessment - 11/10/16 1435    Subjective Patient arrives with her walker and cane, she has been trying to use her personal [facility] more    Pertinent History avulsion fracture L heel, achilles contracture, osteoporosis, no other significant PMH    Patient Stated Goals walk normally, get back to prior level of activity and take care of herself    Currently in Pain? No/denies                         [facility] Adult PT Treatment/Exercise - 11/10/16 0001      Ambulation/Gait   Stairs Yes   Stairs Assistance 4: Min guard   Number of Stairs 7   Height of Stairs 4   Gait Comments [facility]   cues for safety      Manual Therapy   Manual Therapy Soft tissue mobilization   Manual therapy comments completed in prone; separate from all other skilled services    Soft tissue mobilization STM to gastroc/scar massage posteior L ankle      Ankle Exercises: Stretches   Gastroc Stretch 3 reps;30 seconds             Balance Exercises - 11/10/16 1503      Balance Exercises: Standing   Tandem Stance Eyes open;3 reps;20 secs   SLS Eyes open;Solid surface;3 reps;15 secs   Tandem Gait Forward;2 reps  approx 62f each way            PT Education - 11/10/16 1738    Education provided Yes   Education Details HEP udpates and safety concepts in general    Person(s) Educated Patient   Methods Explanation;Demonstration;Handout   Comprehension Verbalized understanding;Returned demonstration;Need further instruction          PT Short Term Goals - 11/03/16 1450      PT SHORT TERM GOAL #1   Title Patient to demonstrate L ankle ROM as being within  8 degrees of that of R on all measured planes in order to improve mechanics and symmetry of gait pattern    Baseline 7/23- L ankle remains very stiff    Time 4   Period Weeks   Status On-going     PT SHORT TERM GOAL #2   Title Patient to consistently be using heel-toe gait pattern with walker in order to improve efficiency of gait and promote normalized ankle mechancis    Baseline 7/23- pateint reports "this is better", inconsistent in clinic    Time 4   Period Weeks   Status On-going     PT SHORT TERM GOAL #3   Title Patient to be able to verbalize proper skin care and shoe fit principles in order to prevent worsening or formation of another area of skin breakdown on posterior L ankle    Baseline 7/23- "neosporin, open back shoes"    Time 4   Period Weeks   Status On-going     PT SHORT TERM GOAL #4   Title Patient to corrrectly and consistently perform  appropriate HEP, to be updated PRN    Baseline 7/23- reports she does HEP 3-4 times per day, PT unsure of form    Time 1   Period Weeks   Status Partially Met           PT Long Term Goals - 11/03/16 1454      PT LONG TERM GOAL #1   Title Patient to show improvement in muscle strength as having improved by at least 1 MMT grade in all tested groups in order to show improved gait and balance skills    Baseline 7/23- improving but continues to be limited by stiffness    Time 8   Period Weeks   Status On-going     PT LONG TERM GOAL #2   Title Patient to be able to ambulate community distances with LRAD and household distances with no device and consistent heel-toe pattern with equal stance times/step lengths in order to improve safe mobility and community access    Baseline 7/23- ongonig    Time 8   Period Weeks   Status On-going     PT LONG TERM GOAL #3   Title Patient to be able to complete TUG test within 12 seconds with LRAD and will also be able to maintain SLS for at least 20 seconds on solid surface each LE in order to show improved mobility and balance    Baseline 7/23- DNT    Time 8   Period Weeks   Status On-going     PT LONG TERM GOAL #4   Title Patient to be able to ascend/descend full flight of stairs with U railing, minimal unsteadiness in order to show improved ankle ROM and muscle control for safe home/community navigation    Time 8   Period Weeks   Status On-going     PT LONG TERM GOAL #5   Title Patient to report she has been able to return to all PLOF based tasks with minimal unsteadiness and no pain increase in order to improve QOL    Baseline 7/23- ongiong    Time 8   Period Weeks   Status On-going               Plan - 11/10/16 1740    Clinical Impression Statement Began session with STM to posterior L ankle, including scar massage; small open area of concern now appears to be closed and  healed over. Otherwise continued with functional ankle  stretching as well as gait training with [facility] including stair training in clinic, also worked on balance and updated HEP to incorporate balance activities this session. Patient with significant difficulty with stairs today especially safe sequencing with cane; continue to be unsure of correct HEP performance at home.    Rehab Potential Good   Clinical Impairments Affecting Rehab Potential (+) high functional level prior to injury, motivated to attempt to return to PLOF; (-) chronicity of current impairment/surgical intervention with regression since surgery per referring MD for this impairment    PT Frequency 2x / week   PT Duration 4 weeks   PT Treatment/Interventions Biofeedback;Cryotherapy;Electrical Stimulation;Moist Heat;Ultrasound;DME Instruction;Gait training;Stair training;Functional mobility training;Therapeutic activities;Therapeutic exercise;Balance training;Neuromuscular re-education;Patient/family education;Orthotic Fit/Training;Manual techniques;Scar mobilization;Passive range of motion;Dry needling;Taping;ADLs/Self Care Home Management   PT Next Visit Plan continue manual and mobility, stair training with cane, balance. Check HEP form.    PT Home Exercise Plan Eval: gastroc stretch, supine ankle PF/DF and supination, ankle circles, heel-toe walking; 7/30: SLS, tandem stance, tandem gait at kitchen sink    Consulted and Agree with Plan of Care Patient      Patient will benefit from skilled therapeutic intervention in order to improve the following deficits and impairments:  Abnormal gait, Decreased skin integrity, Increased fascial restricitons, Decreased coordination, Decreased mobility, Decreased range of motion, Decreased strength, Hypomobility, Decreased balance, Difficulty walking, Impaired flexibility  Visit Diagnosis: Stiffness of left ankle, not elsewhere classified  Other abnormalities of gait and mobility  Contracture of muscle, multiple sites  Difficulty in walking, not  elsewhere classified  Muscle weakness (generalized)     Problem List Patient Active Problem List   Diagnosis Date Noted  . Achilles tendon contracture, left 09/16/2016  . Flexion contracture of joint of left lower leg 08/25/2016  . Avulsion fracture of calcaneus with routine healing, left 08/25/2016    Deniece Ree PT, DPT [place] [address] [place], [name], [address] Phone: [phone]   Fax:  [phone]  Name: Paige Glass MRN: [id number] Date of Birth: 12-Dec-1940

## 2016-11-10 NOTE — Patient Instructions (Signed)
   TANDEM STANCE BALANCE (DO AT KITCHEN COUNTER FOR SAFETY)  Stand and balnace in tandem stance. Hold this position for 15-20 seconds.  Switch your feet.  Repeat 3 times each side, 1-2 times per day.     SINGLE LEG STANCE - SLS (DO AT KITCHEN COUNTER FOR SAFETY)  Stand on one leg and maintain your balance.  Try to hold for 10 seconds.  Repeat 3 times each leg, 2 times a day.     TANDEM WALK (DO AT KITCHEN COUNTER FOR SAFETY)  Stand with one foot directly in front of the other so that the toes of one foot touches the heel of the other as shown in the image.  Walk the length of your counter, 3-4 times, twice a day.

## 2016-11-11 ENCOUNTER — Encounter (INDEPENDENT_AMBULATORY_CARE_PROVIDER_SITE_OTHER): Payer: Self-pay | Admitting: Orthopedic Surgery

## 2016-11-11 ENCOUNTER — Ambulatory Visit (INDEPENDENT_AMBULATORY_CARE_PROVIDER_SITE_OTHER): Payer: PPO | Admitting: Orthopedic Surgery

## 2016-11-11 VITALS — Ht 59.0 in | Wt 113.0 lb

## 2016-11-11 DIAGNOSIS — M6702 Short Achilles tendon (acquired), left ankle: Secondary | ICD-10-CM

## 2016-11-11 NOTE — Progress Notes (Signed)
   Office Visit Note   Patient: Paige Glass           Date of Birth: 01-24-1941           MRN: [id number] Visit Date: 11/11/2016              Requested by: Asencion Noble, MD [address] [place], [place] [address] PCP: Asencion Noble, MD  Chief Complaint  Patient presents with  . Left Foot - Routine Post Op    9 weeks s/p manipulation under anesthesia for achilles contracture.       HPI: Patient is status post Achilles lengthening for heel cord contracture status post reconstruction for avulsion of the calcaneus.  Assessment & Plan: Visit Diagnoses:  1. Achilles tendon contracture, left     Plan: Patient will continue with her therapy continue with her cane continue with heel cord stretching recommended 15-20 mm compression stockings for her venous insufficiency.  Follow-Up Instructions: Return if symptoms worsen or fail to improve.   Ortho Exam  Patient is alert, oriented, no adenopathy, well-dressed, normal affect, normal respiratory effort. Examination patient has dorsiflexion about 20 past neutral she continues to show improvement with the range of motion of her ankle. The leg has some brawny skin color changes consistent with venous insufficiency but no open ulcers.  Imaging: No results found.  Labs: No results found for: HGBA1C, ESRSEDRATE, CRP, LABURIC, REPTSTATUS, GRAMSTAIN, CULT, LABORGA  Orders:  No orders of the defined types were placed in this encounter.  No orders of the defined types were placed in this encounter.    Procedures: No procedures performed  Clinical Data: No additional findings.  ROS:  All other systems negative, except as noted in the HPI. Review of Systems  Objective: Vital Signs: Ht 4\' 11"  (1.499 m)   Wt 113 lb (51.3 kg)   BMI 22.82 kg/m   Specialty Comments:  No specialty comments available.  PMFS History: Patient Active Problem List   Diagnosis Date Noted  . Achilles tendon contracture, left 09/16/2016  .  Flexion contracture of joint of left lower leg 08/25/2016  . Avulsion fracture of calcaneus with routine healing, left 08/25/2016   Past Medical History:  Diagnosis Date  . Back pain   . Joint pain   . Joint swelling   . Nocturia   . Osteoporosis     No family history on file.  Past Surgical History:  Procedure Laterality Date  . bilateral ear surgery    . OPEN REDUCTION INTERNAL FIXATION (ORIF) FOOT LISFRANC FRACTURE Left 07/14/2016   Procedure: OPEN REDUCTION INTERNAL FIXATION (ORIF) FOOT LISFRANC FRACTURE;  Surgeon: Meredith Pel, MD;  Location: [facility];  Service: Orthopedics;  Laterality: Left;  . TRIGGER FINGER RELEASE Right    Social History   Occupational History  . Not on file.   Social History Main Topics  . Smoking status: Former Smoker    Packs/day: 0.50    Years: 25.00    Types: Cigarettes    Quit date: 08/11/2016  . Smokeless tobacco: Never Used  . Alcohol use No  . Drug use: No  . Sexual activity: Not on file

## 2016-11-13 ENCOUNTER — Encounter (HOSPITAL_COMMUNITY): Payer: Self-pay

## 2016-11-13 ENCOUNTER — Ambulatory Visit (HOSPITAL_COMMUNITY): Payer: PPO | Attending: Orthopedic Surgery

## 2016-11-13 DIAGNOSIS — M25672 Stiffness of left ankle, not elsewhere classified: Secondary | ICD-10-CM | POA: Diagnosis not present

## 2016-11-13 DIAGNOSIS — M6249 Contracture of muscle, multiple sites: Secondary | ICD-10-CM | POA: Diagnosis not present

## 2016-11-13 DIAGNOSIS — R262 Difficulty in walking, not elsewhere classified: Secondary | ICD-10-CM | POA: Diagnosis not present

## 2016-11-13 DIAGNOSIS — M6281 Muscle weakness (generalized): Secondary | ICD-10-CM

## 2016-11-13 DIAGNOSIS — R2689 Other abnormalities of gait and mobility: Secondary | ICD-10-CM | POA: Diagnosis not present

## 2016-11-13 NOTE — Therapy (Signed)
[place], [name], [address] Phone: [phone]   Fax:  [phone]  Physical Therapy Treatment  Patient Details  Name: Paige Glass MRN: [id number] Date of Birth: 1940/11/10 Referring Provider: Newt Minion   Encounter Date: 11/13/2016      PT End of Session - 11/13/16 1432    Visit Number 12   Number of Visits 17   Date for PT Re-Evaluation 12/01/16   Authorization Type [employer]  (G-codes done 9th session)   Authorization Time Period 10/06/16 to 12/06/16   Authorization - Visit Number 12   Authorization - Number of Visits 19   PT Start Time 0947   PT Stop Time 1510   PT Time Calculation (min) 42 min   Equipment Utilized During Treatment Gait belt   Activity Tolerance Patient tolerated treatment well   Behavior During Therapy [facility] for tasks assessed/performed      Past Medical History:  Diagnosis Date  . Back pain   . Joint pain   . Joint swelling   . Nocturia   . Osteoporosis     Past Surgical History:  Procedure Laterality Date  . bilateral ear surgery    . OPEN REDUCTION INTERNAL FIXATION (ORIF) FOOT LISFRANC FRACTURE Left 07/14/2016   Procedure: OPEN REDUCTION INTERNAL FIXATION (ORIF) FOOT LISFRANC FRACTURE;  Surgeon: Meredith Pel, MD;  Location: [place];  Service: Orthopedics;  Laterality: Left;  . TRIGGER FINGER RELEASE Right     There were no vitals filed for this visit.      Subjective Assessment - 11/13/16 1429    Subjective Pt states that she saw her doctor yesterday who told her everything was looking good and he discharged her from his care. He told her to continue therapy. She states that overall she can tell she's getting better.    Pertinent History avulsion fracture L heel, achilles contracture, osteoporosis, no other significant PMH    Patient Stated Goals walk normally, get back to prior level of activity and take care of herself    Currently in Pain? No/denies                  [facility] Adult PT Treatment/Exercise - 11/13/16 0001      Manual Therapy   Manual Therapy Soft tissue mobilization   Manual therapy comments completed in prone; separate from all other skilled services    Soft tissue mobilization STM and IASTM to gastroc/soleus complex and to scar on L ankle   Myofascial Release +     Ankle Exercises: Standing   Other Standing Ankle Exercises stair training with [facility] x5RT     Ankle Exercises: Stretches   Gastroc Stretch 3 reps;30 seconds   Gastroc Stretch Limitations standing on step         Balance Exercises - 11/13/16 1502      Balance Exercises: Standing   Tandem Stance Eyes open;Foam/compliant surface;Intermittent upper extremity support;3 reps;10 secs   SLS Eyes open;Foam/compliant surface;Intermittent upper extremity support;3 reps;10 secs   Tandem Gait Forward;1 rep  52f x 1             PT Short Term Goals - 11/03/16 1450      PT SHORT TERM GOAL #1   Title Patient to demonstrate L ankle ROM as being within 8 degrees of that of R on all measured planes in order to improve mechanics and symmetry of gait pattern    Baseline 7/23- L ankle remains  very stiff    Time 4   Period Weeks   Status On-going     PT SHORT TERM GOAL #2   Title Patient to consistently be using heel-toe gait pattern with walker in order to improve efficiency of gait and promote normalized ankle mechancis    Baseline 7/23- pateint reports "this is better", inconsistent in clinic    Time 4   Period Weeks   Status On-going     PT SHORT TERM GOAL #3   Title Patient to be able to verbalize proper skin care and shoe fit principles in order to prevent worsening or formation of another area of skin breakdown on posterior L ankle    Baseline 7/23- "neosporin, open back shoes"    Time 4   Period Weeks   Status On-going     PT SHORT TERM GOAL #4   Title Patient to corrrectly and consistently perform appropriate HEP, to be updated PRN     Baseline 7/23- reports she does HEP 3-4 times per day, PT unsure of form    Time 1   Period Weeks   Status Partially Met           PT Long Term Goals - 11/03/16 1454      PT LONG TERM GOAL #1   Title Patient to show improvement in muscle strength as having improved by at least 1 MMT grade in all tested groups in order to show improved gait and balance skills    Baseline 7/23- improving but continues to be limited by stiffness    Time 8   Period Weeks   Status On-going     PT LONG TERM GOAL #2   Title Patient to be able to ambulate community distances with LRAD and household distances with no device and consistent heel-toe pattern with equal stance times/step lengths in order to improve safe mobility and community access    Baseline 7/23- ongonig    Time 8   Period Weeks   Status On-going     PT LONG TERM GOAL #3   Title Patient to be able to complete TUG test within 12 seconds with LRAD and will also be able to maintain SLS for at least 20 seconds on solid surface each LE in order to show improved mobility and balance    Baseline 7/23- DNT    Time 8   Period Weeks   Status On-going     PT LONG TERM GOAL #4   Title Patient to be able to ascend/descend full flight of stairs with U railing, minimal unsteadiness in order to show improved ankle ROM and muscle control for safe home/community navigation    Time 8   Period Weeks   Status On-going     PT LONG TERM GOAL #5   Title Patient to report she has been able to return to all PLOF based tasks with minimal unsteadiness and no pain increase in order to improve QOL    Baseline 7/23- ongiong    Time 8   Period Weeks   Status On-going               Plan - 11/13/16 1512    Clinical Impression Statement Session focused on improving ankle mobility and balance. Began session with manual to address soft tissue restrictions and then followed up with stair training and balance activities per POC. Pt did well but continued to  require min cues for proper stair ambulation. Pt continues to be challenged with balance  activities. Continue POC as planned.   Rehab Potential Good   Clinical Impairments Affecting Rehab Potential (+) high functional level prior to injury, motivated to attempt to return to PLOF; (-) chronicity of current impairment/surgical intervention with regression since surgery per referring MD for this impairment    PT Frequency 2x / week   PT Duration 4 weeks   PT Treatment/Interventions Biofeedback;Cryotherapy;Electrical Stimulation;Moist Heat;Ultrasound;DME Instruction;Gait training;Stair training;Functional mobility training;Therapeutic activities;Therapeutic exercise;Balance training;Neuromuscular re-education;Patient/family education;Orthotic Fit/Training;Manual techniques;Scar mobilization;Passive range of motion;Dry needling;Taping;ADLs/Self Care Home Management   PT Next Visit Plan continue manual and mobility, stair training with cane, balance. Check HEP form; progess balance, trial retro tandem gait   PT Home Exercise Plan Eval: gastroc stretch, supine ankle PF/DF and supination, ankle circles, heel-toe walking; 7/30: SLS, tandem stance, tandem gait at kitchen sink    Consulted and Agree with Plan of Care Patient      Patient will benefit from skilled therapeutic intervention in order to improve the following deficits and impairments:  Abnormal gait, Decreased skin integrity, Increased fascial restricitons, Decreased coordination, Decreased mobility, Decreased range of motion, Decreased strength, Hypomobility, Decreased balance, Difficulty walking, Impaired flexibility  Visit Diagnosis: Stiffness of left ankle, not elsewhere classified  Other abnormalities of gait and mobility  Contracture of muscle, multiple sites  Difficulty in walking, not elsewhere classified  Muscle weakness (generalized)     Problem List Patient Active Problem List   Diagnosis Date Noted  . Achilles tendon  contracture, left 09/16/2016  . Flexion contracture of joint of left lower leg 08/25/2016  . Avulsion fracture of calcaneus with routine healing, left 08/25/2016     Geraldine Solar PT, DPT  [facility] [address] [place], [name], [address] Phone: [phone]   Fax:  [phone]  Name: Ardie Dragoo Yilmaz MRN: [id number] Date of Birth: May 05, 1940

## 2016-11-17 ENCOUNTER — Ambulatory Visit (HOSPITAL_COMMUNITY): Payer: PPO

## 2016-11-17 DIAGNOSIS — M25672 Stiffness of left ankle, not elsewhere classified: Secondary | ICD-10-CM | POA: Diagnosis not present

## 2016-11-17 DIAGNOSIS — M6281 Muscle weakness (generalized): Secondary | ICD-10-CM

## 2016-11-17 DIAGNOSIS — R2689 Other abnormalities of gait and mobility: Secondary | ICD-10-CM

## 2016-11-17 DIAGNOSIS — M6249 Contracture of muscle, multiple sites: Secondary | ICD-10-CM

## 2016-11-17 DIAGNOSIS — R262 Difficulty in walking, not elsewhere classified: Secondary | ICD-10-CM

## 2016-11-17 NOTE — Therapy (Signed)
[facility] [address] [place], [name], [address] Phone: [phone]   Fax:  [phone]  Physical Therapy Treatment  Patient Details  Name: Paige Glass MRN: [id number] Date of Birth: 1941/03/04 Referring Provider: Newt Minion   Encounter Date: 11/17/2016      PT End of Session - 11/17/16 1437    Visit Number 13   Number of Visits 17   Date for PT Re-Evaluation 12/01/16   Authorization Type [employer]  (G-codes done 9th session)   Authorization Time Period 10/06/16 to 12/06/16   Authorization - Visit Number 13   Authorization - Number of Visits 19   PT Start Time 5465   PT Stop Time 1519   PT Time Calculation (min) 47 min   Equipment Utilized During Treatment Gait belt   Activity Tolerance Patient tolerated treatment well   Behavior During Therapy [facility] for tasks assessed/performed      Past Medical History:  Diagnosis Date  . Back pain   . Joint pain   . Joint swelling   . Nocturia   . Osteoporosis     Past Surgical History:  Procedure Laterality Date  . bilateral ear surgery    . OPEN REDUCTION INTERNAL FIXATION (ORIF) FOOT LISFRANC FRACTURE Left 07/14/2016   Procedure: OPEN REDUCTION INTERNAL FIXATION (ORIF) FOOT LISFRANC FRACTURE;  Surgeon: Meredith Pel, MD;  Location: [place];  Service: Orthopedics;  Laterality: Left;  . TRIGGER FINGER RELEASE Right     There were no vitals filed for this visit.      Subjective Assessment - 11/17/16 1436    Subjective Pt stated she is pain free at entrance, feels her balance is currently most difficulty.  No reoprts of recent falls.     Pertinent History avulsion fracture L heel, achilles contracture, osteoporosis, no other significant PMH    Patient Stated Goals walk normally, get back to prior level of activity and take care of herself    Currently in Pain? No/denies               [facility] Adult PT Treatment/Exercise - 11/17/16 0001      Manual Therapy    Manual Therapy Soft tissue mobilization;Myofascial release   Manual therapy comments completed in prone; separate from all other skilled services    Soft tissue mobilization STM gastroc/soleus   Myofascial Release to adhesions scar and perimeter of scar     Ankle Exercises: Standing   Other Standing Ankle Exercises stair training with [facility] x5RT     Ankle Exercises: Stretches   Plantar Fascia Stretch 3 reps;30 seconds  on stairs             Balance Exercises - 11/17/16 1457      Balance Exercises: Standing   Tandem Stance Eyes open;3 reps;30 secs;Intermittent upper extremity support;Foam/compliant surface  Foam 3x 30" 2nd set with 10 head turns 3rd set 10x UE Flexio   SLS Eyes open;Solid surface;3 reps  Rt 60", Lt 22" max of 3   Tandem Gait Forward;Retro;1 rep             PT Short Term Goals - 11/03/16 1450      PT SHORT TERM GOAL #1   Title Patient to demonstrate L ankle ROM as being within 8 degrees of that of R on all measured planes in order to improve mechanics and symmetry of gait pattern    Baseline 7/23- L ankle remains very stiff    Time  4   Period Weeks   Status On-going     PT SHORT TERM GOAL #2   Title Patient to consistently be using heel-toe gait pattern with walker in order to improve efficiency of gait and promote normalized ankle mechancis    Baseline 7/23- pateint reports "this is better", inconsistent in clinic    Time 4   Period Weeks   Status On-going     PT SHORT TERM GOAL #3   Title Patient to be able to verbalize proper skin care and shoe fit principles in order to prevent worsening or formation of another area of skin breakdown on posterior L ankle    Baseline 7/23- "neosporin, open back shoes"    Time 4   Period Weeks   Status On-going     PT SHORT TERM GOAL #4   Title Patient to corrrectly and consistently perform appropriate HEP, to be updated PRN    Baseline 7/23- reports she does HEP 3-4 times per day, PT unsure of form    Time  1   Period Weeks   Status Partially Met           PT Long Term Goals - 11/03/16 1454      PT LONG TERM GOAL #1   Title Patient to show improvement in muscle strength as having improved by at least 1 MMT grade in all tested groups in order to show improved gait and balance skills    Baseline 7/23- improving but continues to be limited by stiffness    Time 8   Period Weeks   Status On-going     PT LONG TERM GOAL #2   Title Patient to be able to ambulate community distances with LRAD and household distances with no device and consistent heel-toe pattern with equal stance times/step lengths in order to improve safe mobility and community access    Baseline 7/23- ongonig    Time 8   Period Weeks   Status On-going     PT LONG TERM GOAL #3   Title Patient to be able to complete TUG test within 12 seconds with LRAD and will also be able to maintain SLS for at least 20 seconds on solid surface each LE in order to show improved mobility and balance    Baseline 7/23- DNT    Time 8   Period Weeks   Status On-going     PT LONG TERM GOAL #4   Title Patient to be able to ascend/descend full flight of stairs with U railing, minimal unsteadiness in order to show improved ankle ROM and muscle control for safe home/community navigation    Time 8   Period Weeks   Status On-going     PT LONG TERM GOAL #5   Title Patient to report she has been able to return to all PLOF based tasks with minimal unsteadiness and no pain increase in order to improve QOL    Baseline 7/23- ongiong    Time 8   Period Weeks   Status On-going               Plan - 11/17/16 1627    Clinical Impression Statement Continued session focus with ankle mobility, functional strengthening with stair training and increased focus wiht balance activiteis today.  Added retro tandem gait and head/arm movements wiht tandem stance on dynamic surface to improve finding COG with min A.  EOS with manual to address myofascial  and soft tissue restrictions to improve ankle mobilty.  No reports of increased pain through session.     Rehab Potential Good   Clinical Impairments Affecting Rehab Potential (+) high functional level prior to injury, motivated to attempt to return to PLOF; (-) chronicity of current impairment/surgical intervention with regression since surgery per referring MD for this impairment    PT Frequency 2x / week   PT Duration 4 weeks   PT Treatment/Interventions Biofeedback;Cryotherapy;Electrical Stimulation;Moist Heat;Ultrasound;DME Instruction;Gait training;Stair training;Functional mobility training;Therapeutic activities;Therapeutic exercise;Balance training;Neuromuscular re-education;Patient/family education;Orthotic Fit/Training;Manual techniques;Scar mobilization;Passive range of motion;Dry needling;Taping;ADLs/Self Care Home Management   PT Next Visit Plan continue manual and mobility, stair training with cane, balance. Check HEP form; progess balance.      Patient will benefit from skilled therapeutic intervention in order to improve the following deficits and impairments:  Abnormal gait, Decreased skin integrity, Increased fascial restricitons, Decreased coordination, Decreased mobility, Decreased range of motion, Decreased strength, Hypomobility, Decreased balance, Difficulty walking, Impaired flexibility  Visit Diagnosis: Stiffness of left ankle, not elsewhere classified  Other abnormalities of gait and mobility  Contracture of muscle, multiple sites  Difficulty in walking, not elsewhere classified  Muscle weakness (generalized)     Problem List Patient Active Problem List   Diagnosis Date Noted  . Achilles tendon contracture, left 09/16/2016  . Flexion contracture of joint of left lower leg 08/25/2016  . Avulsion fracture of calcaneus with routine healing, left 08/25/2016   Ihor Austin, LPTA; CBIS [phone]  Aldona Lento 11/17/2016, 4:34 PM  [facility], [name], [address] Phone: [phone]   Fax:  [phone]  Name: Paige Glass MRN: [id number] Date of Birth: 02-04-1941

## 2016-11-20 ENCOUNTER — Ambulatory Visit (HOSPITAL_COMMUNITY): Payer: PPO | Admitting: Physical Therapy

## 2016-11-20 DIAGNOSIS — M6249 Contracture of muscle, multiple sites: Secondary | ICD-10-CM

## 2016-11-20 DIAGNOSIS — R2689 Other abnormalities of gait and mobility: Secondary | ICD-10-CM

## 2016-11-20 DIAGNOSIS — R262 Difficulty in walking, not elsewhere classified: Secondary | ICD-10-CM

## 2016-11-20 DIAGNOSIS — M25672 Stiffness of left ankle, not elsewhere classified: Secondary | ICD-10-CM

## 2016-11-20 DIAGNOSIS — M6281 Muscle weakness (generalized): Secondary | ICD-10-CM

## 2016-11-20 NOTE — Therapy (Signed)
[facility] [place], [name], [address] Phone: [phone]   Fax:  [phone]  Physical Therapy Treatment  Patient Details  Name: Paige Glass MRN: [id number] Date of Birth: 01/01/1941 Referring Provider: Newt Minion   Encounter Date: 11/20/2016      PT End of Session - 11/20/16 1605    Visit Number 14   Number of Visits 17   Date for PT Re-Evaluation 12/01/16   Authorization Type [employer]  (G-codes done 9th session)   Authorization Time Period 10/06/16 to 12/06/16   Authorization - Visit Number 14   Authorization - Number of Visits 19   PT Start Time 1435   PT Stop Time 1514   PT Time Calculation (min) 39 min   Equipment Utilized During Treatment Gait belt   Activity Tolerance Patient tolerated treatment well   Behavior During Therapy [facility] for tasks assessed/performed      Past Medical History:  Diagnosis Date  . Back pain   . Joint pain   . Joint swelling   . Nocturia   . Osteoporosis     Past Surgical History:  Procedure Laterality Date  . bilateral ear surgery    . OPEN REDUCTION INTERNAL FIXATION (ORIF) FOOT LISFRANC FRACTURE Left 07/14/2016   Procedure: OPEN REDUCTION INTERNAL FIXATION (ORIF) FOOT LISFRANC FRACTURE;  Surgeon: Meredith Pel, MD;  Location: [place];  Service: Orthopedics;  Laterality: Left;  . TRIGGER FINGER RELEASE Right     There were no vitals filed for this visit.      Subjective Assessment - 11/20/16 1437    Subjective Patient states that she is doing well, she states that Dr. Sharol Given has released her. She is concerned about financial aspects of PT.    Pertinent History avulsion fracture L heel, achilles contracture, osteoporosis, no other significant PMH    Patient Stated Goals walk normally, get back to prior level of activity and take care of herself    Currently in Pain? No/denies                         [facility] Adult PT Treatment/Exercise -  11/20/16 0001      Ambulation/Gait   Gait Comments 4 inch and 7 inch steps with small base quad cane min guard to S; min cues for sequencing on stiars. Also gait outside with small base quad cane with close S, no significant unsteadiness noted and able to grossly self correct      Exercises   Exercises Knee/Hip     Knee/Hip Exercises: Standing   Lateral Step Up Both;1 set;10 reps   Lateral Step Up Limitations 4 inch box U HHA    Forward Step Up Both;1 set;10 reps   Forward Step Up Limitations 4 inch box U HHA                 PT Education - 11/20/16 1604    Education provided Yes   Education Details call insurance for more informaation regarding co-pays/etc; process of getting compression stockings; transition to [facility] and [facility] and away from walker at home    Person(s) Educated Patient   Methods Explanation   Comprehension Verbalized understanding          PT Short Term Goals - 11/03/16 1450      PT SHORT TERM GOAL #1   Title Patient to demonstrate L ankle ROM as being within 8 degrees of that of R  on all measured planes in order to improve mechanics and symmetry of gait pattern    Baseline 7/23- L ankle remains very stiff    Time 4   Period Weeks   Status On-going     PT SHORT TERM GOAL #2   Title Patient to consistently be using heel-toe gait pattern with walker in order to improve efficiency of gait and promote normalized ankle mechancis    Baseline 7/23- pateint reports "this is better", inconsistent in clinic    Time 4   Period Weeks   Status On-going     PT SHORT TERM GOAL #3   Title Patient to be able to verbalize proper skin care and shoe fit principles in order to prevent worsening or formation of another area of skin breakdown on posterior L ankle    Baseline 7/23- "neosporin, open back shoes"    Time 4   Period Weeks   Status On-going     PT SHORT TERM GOAL #4   Title Patient to corrrectly and consistently perform appropriate HEP, to be updated PRN     Baseline 7/23- reports she does HEP 3-4 times per day, PT unsure of form    Time 1   Period Weeks   Status Partially Met           PT Long Term Goals - 11/03/16 1454      PT LONG TERM GOAL #1   Title Patient to show improvement in muscle strength as having improved by at least 1 MMT grade in all tested groups in order to show improved gait and balance skills    Baseline 7/23- improving but continues to be limited by stiffness    Time 8   Period Weeks   Status On-going     PT LONG TERM GOAL #2   Title Patient to be able to ambulate community distances with LRAD and household distances with no device and consistent heel-toe pattern with equal stance times/step lengths in order to improve safe mobility and community access    Baseline 7/23- ongonig    Time 8   Period Weeks   Status On-going     PT LONG TERM GOAL #3   Title Patient to be able to complete TUG test within 12 seconds with LRAD and will also be able to maintain SLS for at least 20 seconds on solid surface each LE in order to show improved mobility and balance    Baseline 7/23- DNT    Time 8   Period Weeks   Status On-going     PT LONG TERM GOAL #4   Title Patient to be able to ascend/descend full flight of stairs with U railing, minimal unsteadiness in order to show improved ankle ROM and muscle control for safe home/community navigation    Time 8   Period Weeks   Status On-going     PT LONG TERM GOAL #5   Title Patient to report she has been able to return to all PLOF based tasks with minimal unsteadiness and no pain increase in order to improve QOL    Baseline 7/23- ongiong    Time 8   Period Weeks   Status On-going               Plan - 11/20/16 1605    Clinical Impression Statement Education provided regarding process of getting compression stockings; encouraged to call insurance herself with financial and bill concerns. Spent majority of session on gait and stair training with  small quad cane;  patient does continue to require min cues on stairs for sequencing however is able to ambulate safely outside with min guard to close S, no major LOB noted and grossly able to self correct. Otherwise worked on functional strength for LEs to assist in tasks such as stair navigation.    Rehab Potential Good   Clinical Impairments Affecting Rehab Potential (+) high functional level prior to injury, motivated to attempt to return to PLOF; (-) chronicity of current impairment/surgical intervention with regression since surgery per referring MD for this impairment    PT Frequency 2x / week   PT Duration 4 weeks   PT Treatment/Interventions Biofeedback;Cryotherapy;Electrical Stimulation;Moist Heat;Ultrasound;DME Instruction;Gait training;Stair training;Functional mobility training;Therapeutic activities;Therapeutic exercise;Balance training;Neuromuscular re-education;Patient/family education;Orthotic Fit/Training;Manual techniques;Scar mobilization;Passive range of motion;Dry needling;Taping;ADLs/Self Care Home Management   PT Next Visit Plan cotninue stiar and outside training with [facility]; balance and more focus on functional [facility] LE strength. Measurse for compression stockings    PT Home Exercise Plan Eval: gastroc stretch, supine ankle PF/DF and supination, ankle circles, heel-toe walking; 7/30: SLS, tandem stance, tandem gait at kitchen sink    Consulted and Agree with Plan of Care Patient      Patient will benefit from skilled therapeutic intervention in order to improve the following deficits and impairments:  Abnormal gait, Decreased skin integrity, Increased fascial restricitons, Decreased coordination, Decreased mobility, Decreased range of motion, Decreased strength, Hypomobility, Decreased balance, Difficulty walking, Impaired flexibility  Visit Diagnosis: Stiffness of left ankle, not elsewhere classified  Other abnormalities of gait and mobility  Contracture of muscle, multiple  sites  Difficulty in walking, not elsewhere classified  Muscle weakness (generalized)     Problem List Patient Active Problem List   Diagnosis Date Noted  . Achilles tendon contracture, left 09/16/2016  . Flexion contracture of joint of left lower leg 08/25/2016  . Avulsion fracture of calcaneus with routine healing, left 08/25/2016    Deniece Ree PT, DPT [facility] [address] [place], [name], [address] Phone: [phone]   Fax:  [phone]  Name: Paige Glass MRN: [id number] Date of Birth: 09/14/1940

## 2016-11-24 ENCOUNTER — Ambulatory Visit (HOSPITAL_COMMUNITY): Payer: PPO | Admitting: Physical Therapy

## 2016-11-24 DIAGNOSIS — R2689 Other abnormalities of gait and mobility: Secondary | ICD-10-CM

## 2016-11-24 DIAGNOSIS — M25672 Stiffness of left ankle, not elsewhere classified: Secondary | ICD-10-CM

## 2016-11-24 DIAGNOSIS — M6281 Muscle weakness (generalized): Secondary | ICD-10-CM

## 2016-11-24 DIAGNOSIS — R262 Difficulty in walking, not elsewhere classified: Secondary | ICD-10-CM

## 2016-11-24 DIAGNOSIS — M6249 Contracture of muscle, multiple sites: Secondary | ICD-10-CM

## 2016-11-24 NOTE — Therapy (Signed)
[facility] [address] [place], [place], [address] Phone: [phone]   Fax:  [phone]  Physical Therapy Treatment  Patient Details  Name: Paige Glass MRN: [id number] Date of Birth: 03/19/1941 Referring Provider: Marcus Duda V   Encounter Date: 11/24/2016      PT End of Session - 11/24/16 1639    Visit Number 15   Number of Visits 17   Date for PT Re-Evaluation 12/01/16   Authorization Type [employer]  (G-codes done 9th session)   Authorization Time Period 10/06/16 to 12/06/16   Authorization - Visit Number 15   Authorization - Number of Visits 19   PT Start Time 1432   PT Stop Time 1514   PT Time Calculation (min) 42 min   Activity Tolerance Patient tolerated treatment well   Behavior During Therapy WFL for tasks assessed/performed      Past Medical History:  Diagnosis Date  . Back pain   . Joint pain   . Joint swelling   . Nocturia   . Osteoporosis     Past Surgical History:  Procedure Laterality Date  . bilateral ear surgery    . OPEN REDUCTION INTERNAL FIXATION (ORIF) FOOT LISFRANC FRACTURE Left 07/14/2016   Procedure: OPEN REDUCTION INTERNAL FIXATION (ORIF) FOOT LISFRANC FRACTURE;  Surgeon: Scott Gregory Dean, MD;  Location: [facility] OR;  Service: Orthopedics;  Laterality: Left;  . TRIGGER FINGER RELEASE Right     There were no vitals filed for this visit.      Subjective Assessment - 11/24/16 1436    Subjective Patient arrives with LBQC and SBQC, reports she has been using small base cane indoors and large base outdoors   Pertinent History avulsion fracture L heel, achilles contracture, osteoporosis, no other significant PMH    Patient Stated Goals walk normally, get back to prior level of activity and take care of herself    Currently in Pain? No/denies                         [facility] Adult PT Treatment/Exercise - 11/24/16 0001      Ambulation/Gait   Gait Comments 7 inch steps with  SBQC only occasional cues, distant S; step to pattern with SPC on 4 inch stairs, recriprocal stair training with U railing on 4 inch steps      Knee/Hip Exercises: Standing   Heel Raises Both;1 set;20 reps   Heel Raises Limitations heel and toe    Forward Lunges Both;1 set;15 reps   Forward Lunges Limitations 4 inch box U HHA    Lateral Step Up Both;1 set;15 reps   Lateral Step Up Limitations 4 inch box U HHA    Forward Step Up Both;1 set;15 reps   Forward Step Up Limitations 4 inch box U HHA    Step Down Both;1 set;15 reps   Step Down Limitations 4 inch box U HHA    Other Standing Knee Exercises eccentric stand to sits 1x15             Balance Exercises - 11/24/16 1503      Balance Exercises: Standing   Tandem Stance Eyes open;Foam/compliant surface;3 reps;15 secs   SLS Eyes open;Solid surface;3 reps;20 secs           PT Education - 11/24/16 1638    Education provided No          PT Short Term Goals - 11/03/16 1450        PT SHORT TERM GOAL #1   Title Patient to demonstrate L ankle ROM as being within 8 degrees of that of R on all measured planes in order to improve mechanics and symmetry of gait pattern    Baseline 7/23- L ankle remains very stiff    Time 4   Period Weeks   Status On-going     PT SHORT TERM GOAL #2   Title Patient to consistently be using heel-toe gait pattern with walker in order to improve efficiency of gait and promote normalized ankle mechancis    Baseline 7/23- pateint reports "this is better", inconsistent in clinic    Time 4   Period Weeks   Status On-going     PT SHORT TERM GOAL #3   Title Patient to be able to verbalize proper skin care and shoe fit principles in order to prevent worsening or formation of another area of skin breakdown on posterior L ankle    Baseline 7/23- "neosporin, open back shoes"    Time 4   Period Weeks   Status On-going     PT SHORT TERM GOAL #4   Title Patient to corrrectly and consistently perform  appropriate HEP, to be updated PRN    Baseline 7/23- reports she does HEP 3-4 times per day, PT unsure of form    Time 1   Period Weeks   Status Partially Met           PT Long Term Goals - 11/03/16 1454      PT LONG TERM GOAL #1   Title Patient to show improvement in muscle strength as having improved by at least 1 MMT grade in all tested groups in order to show improved gait and balance skills    Baseline 7/23- improving but continues to be limited by stiffness    Time 8   Period Weeks   Status On-going     PT LONG TERM GOAL #2   Title Patient to be able to ambulate community distances with LRAD and household distances with no device and consistent heel-toe pattern with equal stance times/step lengths in order to improve safe mobility and community access    Baseline 7/23- ongonig    Time 8   Period Weeks   Status On-going     PT LONG TERM GOAL #3   Title Patient to be able to complete TUG test within 12 seconds with LRAD and will also be able to maintain SLS for at least 20 seconds on solid surface each LE in order to show improved mobility and balance    Baseline 7/23- DNT    Time 8   Period Weeks   Status On-going     PT LONG TERM GOAL #4   Title Patient to be able to ascend/descend full flight of stairs with U railing, minimal unsteadiness in order to show improved ankle ROM and muscle control for safe home/community navigation    Time 8   Period Weeks   Status On-going     PT LONG TERM GOAL #5   Title Patient to report she has been able to return to all PLOF based tasks with minimal unsteadiness and no pain increase in order to improve QOL    Baseline 7/23- ongiong    Time 8   Period Weeks   Status On-going               Plan - 11/24/16 1639    Clinical Impression Statement Focused today's session on CKC   strengthening to assist with gait and stair navigation as well as ongoing work on functional balance based tasks, noting ongoing difficulty with  balance when supported by L LE. Continued stair training with small quad cane and SPC today as well. Patient brought her personal SBQC which appears appropriately sized at this time.    Rehab Potential Good   Clinical Impairments Affecting Rehab Potential (+) high functional level prior to injury, motivated to attempt to return to PLOF; (-) chronicity of current impairment/surgical intervention with regression since surgery per referring MD for this impairment    PT Frequency 2x / week   PT Duration 4 weeks   PT Treatment/Interventions Biofeedback;Cryotherapy;Electrical Stimulation;Moist Heat;Ultrasound;DME Instruction;Gait training;Stair training;Functional mobility training;Therapeutic activities;Therapeutic exercise;Balance training;Neuromuscular re-education;Patient/family education;Orthotic Fit/Training;Manual techniques;Scar mobilization;Passive range of motion;Dry needling;Taping;ADLs/Self Care Home Management   PT Next Visit Plan continue CKC strengthening; continue stair training with SPC and work on reciprocal pattern rather than step to now. Measure for cmopression stockings.    PT Home Exercise Plan Eval: gastroc stretch, supine ankle PF/DF and supination, ankle circles, heel-toe walking; 7/30: SLS, tandem stance, tandem gait at kitchen sink    Consulted and Agree with Plan of Care Patient      Patient will benefit from skilled therapeutic intervention in order to improve the following deficits and impairments:  Abnormal gait, Decreased skin integrity, Increased fascial restricitons, Decreased coordination, Decreased mobility, Decreased range of motion, Decreased strength, Hypomobility, Decreased balance, Difficulty walking, Impaired flexibility  Visit Diagnosis: Stiffness of left ankle, not elsewhere classified  Other abnormalities of gait and mobility  Contracture of muscle, multiple sites  Difficulty in walking, not elsewhere classified  Muscle weakness  (generalized)     Problem List Patient Active Problem List   Diagnosis Date Noted  . Achilles tendon contracture, left 09/16/2016  . Flexion contracture of joint of left lower leg 08/25/2016  . Avulsion fracture of calcaneus with routine healing, left 08/25/2016    Kristen Unger PT, DPT [phone]  [facility] [address] [place], [place], [address] Phone: [phone]   Fax:  [phone]  Name: Paige Glass MRN: [id number] Date of Birth: 01/09/1941   

## 2016-11-27 ENCOUNTER — Ambulatory Visit (HOSPITAL_COMMUNITY): Payer: PPO

## 2016-11-27 DIAGNOSIS — M6281 Muscle weakness (generalized): Secondary | ICD-10-CM

## 2016-11-27 DIAGNOSIS — R2689 Other abnormalities of gait and mobility: Secondary | ICD-10-CM

## 2016-11-27 DIAGNOSIS — M6249 Contracture of muscle, multiple sites: Secondary | ICD-10-CM

## 2016-11-27 DIAGNOSIS — R262 Difficulty in walking, not elsewhere classified: Secondary | ICD-10-CM

## 2016-11-27 DIAGNOSIS — M25672 Stiffness of left ankle, not elsewhere classified: Secondary | ICD-10-CM | POA: Diagnosis not present

## 2016-11-27 NOTE — Therapy (Signed)
[place], [name], [address] Phone: [phone]   Fax:  [phone]  Physical Therapy Treatment  Patient Details  Name: Paige Glass MRN: [id number] Date of Birth: [birth year] Referring Provider: Newt Minion   Encounter Date: 11/27/2016      PT End of Session - 11/27/16 1442    Visit Number 16   Number of Visits 17   Date for PT Re-Evaluation 12/01/16   Authorization Type [employer]  (G-codes done 9th session)   Authorization Time Period 10/06/16 to 12/06/16   Authorization - Visit Number 16   Authorization - Number of Visits 19   PT Start Time 5035   PT Stop Time 1517   PT Time Calculation (min) 43 min   Equipment Utilized During Treatment Gait belt   Activity Tolerance Patient tolerated treatment well;No increased pain   Behavior During Therapy WFL for tasks assessed/performed      Past Medical History:  Diagnosis Date  . Back pain   . Joint pain   . Joint swelling   . Nocturia   . Osteoporosis     Past Surgical History:  Procedure Laterality Date  . bilateral ear surgery    . OPEN REDUCTION INTERNAL FIXATION (ORIF) FOOT LISFRANC FRACTURE Left 07/14/2016   Procedure: OPEN REDUCTION INTERNAL FIXATION (ORIF) FOOT LISFRANC FRACTURE;  Surgeon: Meredith Pel, MD;  Location: [facility];  Service: Orthopedics;  Laterality: Left;  . TRIGGER FINGER RELEASE Right     There were no vitals filed for this visit.      Subjective Assessment - 11/27/16 1439    Subjective Pt entered with small base quad cane, reports she is feeling good, continues to have some difficulty with strength and balance.  No reoprts of recent fall/LOB.  Lt foot continues to swell.     Pertinent History avulsion fracture L heel, achilles contracture, osteoporosis, no other significant PMH    Patient Stated Goals walk normally, get back to prior level of activity and take care of herself    Currently in Pain? No/denies             [facility] PT Assessment - 11/27/16 0001      Observation/Other Assessments-Edema    Edema --  measurement for compression hose; ankle: 9.25in/ calf 13.75                     [facility] Adult PT Treatment/Exercise - 11/27/16 0001      Ambulation/Gait   Ambulation/Gait Yes   Ambulation/Gait Assistance 5: Supervision   Ambulation Distance (Feet) 500 Feet   Assistive device Straight cane   Gait Pattern Step-through pattern;Decreased step length - right;Decreased stance time - left   Stairs Yes   Stairs Assistance 5: Supervision   Stairs Assistance Details (indicate cue type and reason) SPC placement when ascend/descending stairs   Number of Stairs 4  5RT first step to then reciprocal pattern   Height of Stairs 7   Gait Comments 7 in step height5RT first step to then reciprocal pattern     Knee/Hip Exercises: Standing   Heel Raises Both;1 set;20 reps   Heel Raises Limitations heel and toe on incline   Stairs 5RT step to then reciprocal with cueing for mechanics/sequence   SLS 3x     Ankle Exercises: Stretches   Slant Board Stretch 3 reps;30 seconds             Balance Exercises - 11/27/16 1530  Balance Exercises: Standing   Tandem Stance Eyes open;Foam/compliant surface;3 reps;30 secs   SLS Eyes open;Solid surface;3 reps  L27, Rt 44" max of 3   Balance Beam tandem gait on balance beam 2RT   Tandem Gait Forward;2 reps             PT Short Term Goals - 11/03/16 1450      PT SHORT TERM GOAL #1   Title Patient to demonstrate L ankle ROM as being within 8 degrees of that of R on all measured planes in order to improve mechanics and symmetry of gait pattern    Baseline 7/23- L ankle remains very stiff    Time 4   Period Weeks   Status On-going     PT SHORT TERM GOAL #2   Title Patient to consistently be using heel-toe gait pattern with walker in order to improve efficiency of gait and promote normalized ankle mechancis    Baseline 7/23-  pateint reports "this is better", inconsistent in clinic    Time 4   Period Weeks   Status On-going     PT SHORT TERM GOAL #3   Title Patient to be able to verbalize proper skin care and shoe fit principles in order to prevent worsening or formation of another area of skin breakdown on posterior L ankle    Baseline 7/23- "neosporin, open back shoes"    Time 4   Period Weeks   Status On-going     PT SHORT TERM GOAL #4   Title Patient to corrrectly and consistently perform appropriate HEP, to be updated PRN    Baseline 7/23- reports she does HEP 3-4 times per day, PT unsure of form    Time 1   Period Weeks   Status Partially Met           PT Long Term Goals - 11/03/16 1454      PT LONG TERM GOAL #1   Title Patient to show improvement in muscle strength as having improved by at least 1 MMT grade in all tested groups in order to show improved gait and balance skills    Baseline 7/23- improving but continues to be limited by stiffness    Time 8   Period Weeks   Status On-going     PT LONG TERM GOAL #2   Title Patient to be able to ambulate community distances with LRAD and household distances with no device and consistent heel-toe pattern with equal stance times/step lengths in order to improve safe mobility and community access    Baseline 7/23- ongonig    Time 8   Period Weeks   Status On-going     PT LONG TERM GOAL #3   Title Patient to be able to complete TUG test within 12 seconds with LRAD and will also be able to maintain SLS for at least 20 seconds on solid surface each LE in order to show improved mobility and balance    Baseline 7/23- DNT    Time 8   Period Weeks   Status On-going     PT LONG TERM GOAL #4   Title Patient to be able to ascend/descend full flight of stairs with U railing, minimal unsteadiness in order to show improved ankle ROM and muscle control for safe home/community navigation    Time 8   Period Weeks   Status On-going     PT LONG TERM GOAL  #5   Title Patient to report she has been able  to return to all PLOF based tasks with minimal unsteadiness and no pain increase in order to improve QOL    Baseline 7/23- ongiong    Time 8   Period Weeks   Status On-going               Plan - 11/27/16 1532    Clinical Impression Statement Continued session foucs with therex with CKC to improve LE functional strengthening and balance.  Began gait training with SPC with good sequence noted.  Educated proper sequence for stairs with SPC with minimal cueing for proper placement on step.  Progressed balance with dynamic surface with min A for safety.  EOS measurements taken and paperwork given for knee high 20-30 mmHg compression stockings, pt verbally understood process of purchase.  No reports of pain through session.    Rehab Potential Good   Clinical Impairments Affecting Rehab Potential (+) high functional level prior to injury, motivated to attempt to return to PLOF; (-) chronicity of current impairment/surgical intervention with regression since surgery per referring MD for this impairment    PT Frequency 2x / week   PT Duration 4 weeks   PT Treatment/Interventions Biofeedback;Cryotherapy;Electrical Stimulation;Moist Heat;Ultrasound;DME Instruction;Gait training;Stair training;Functional mobility training;Therapeutic activities;Therapeutic exercise;Balance training;Neuromuscular re-education;Patient/family education;Orthotic Fit/Training;Manual techniques;Scar mobilization;Passive range of motion;Dry needling;Taping;ADLs/Self Care Home Management   PT Next Visit Plan continue CKC strengthening; continue stair training with SPC and work on reciprocal pattern rather than step to now. F/U on purchase and wear time wiht compression stockings.    PT Home Exercise Plan Eval: gastroc stretch, supine ankle PF/DF and supination, ankle circles, heel-toe walking; 7/30: SLS, tandem stance, tandem gait at kitchen sink       Patient will benefit from  skilled therapeutic intervention in order to improve the following deficits and impairments:  Abnormal gait, Decreased skin integrity, Increased fascial restricitons, Decreased coordination, Decreased mobility, Decreased range of motion, Decreased strength, Hypomobility, Decreased balance, Difficulty walking, Impaired flexibility  Visit Diagnosis: Stiffness of left ankle, not elsewhere classified  Other abnormalities of gait and mobility  Contracture of muscle, multiple sites  Difficulty in walking, not elsewhere classified  Muscle weakness (generalized)     Problem List Patient Active Problem List   Diagnosis Date Noted  . Achilles tendon contracture, left 09/16/2016  . Flexion contracture of joint of left lower leg 08/25/2016  . Avulsion fracture of calcaneus with routine healing, left 08/25/2016   Ihor Austin, LPTA; CBIS [phone]  Aldona Lento 11/27/2016, 4:32 PM  [facility] [address] [place], [name], [address] Phone: [phone]   Fax:  [phone]  Name: Paige Glass MRN: [id number] Date of Birth: [birth year]

## 2016-12-02 ENCOUNTER — Ambulatory Visit (HOSPITAL_COMMUNITY): Payer: PPO | Admitting: Physical Therapy

## 2016-12-02 DIAGNOSIS — M25672 Stiffness of left ankle, not elsewhere classified: Secondary | ICD-10-CM

## 2016-12-02 DIAGNOSIS — R2689 Other abnormalities of gait and mobility: Secondary | ICD-10-CM

## 2016-12-02 DIAGNOSIS — M6281 Muscle weakness (generalized): Secondary | ICD-10-CM

## 2016-12-02 DIAGNOSIS — R262 Difficulty in walking, not elsewhere classified: Secondary | ICD-10-CM

## 2016-12-02 DIAGNOSIS — M6249 Contracture of muscle, multiple sites: Secondary | ICD-10-CM

## 2016-12-02 NOTE — Therapy (Signed)
[place], [name], [address] Phone: [phone]   Fax:  [phone]  Physical Therapy Treatment (Re-Assessment)  Patient Details  Name: Paige Glass MRN: [id number] Date of Birth: 07/29/1940 Referring Provider: Newt Minion   Encounter Date: 12/02/2016      PT End of Session - 12/02/16 1719    Visit Number 17   Number of Visits 24   Date for PT Re-Evaluation 12/30/16   Authorization Type [employer]  (G-codes done 17th session)   Authorization Time Period 05/25/92 to 1/74/08; recert done 1/44   Authorization - Visit Number 75   Authorization - Number of Visits 27   PT Start Time 8185   PT Stop Time 1512   PT Time Calculation (min) 38 min   Equipment Utilized During Treatment Gait belt   Activity Tolerance Patient tolerated treatment well   Behavior During Therapy [facility] for tasks assessed/performed      Past Medical History:  Diagnosis Date  . Back pain   . Joint pain   . Joint swelling   . Nocturia   . Osteoporosis     Past Surgical History:  Procedure Laterality Date  . bilateral ear surgery    . OPEN REDUCTION INTERNAL FIXATION (ORIF) FOOT LISFRANC FRACTURE Left 07/14/2016   Procedure: OPEN REDUCTION INTERNAL FIXATION (ORIF) FOOT LISFRANC FRACTURE;  Surgeon: Meredith Pel, MD;  Location: [facility];  Service: Orthopedics;  Laterality: Left;  . TRIGGER FINGER RELEASE Right     There were no vitals filed for this visit.      Subjective Assessment - 12/02/16 1441    Subjective Patient arrives with small base quad cane, she reporst she is feeling good but the back of her heel is sore to the touch. She feels confident with her walking and on stairs. Her biggest concern right now is getting strength and balance back. She plans to call the compression stocking outlet in [place].    Pertinent History avulsion fracture L heel, achilles contracture, osteoporosis, no other significant PMH    How  long can you sit comfortably? 8/21- unlimited    How long can you stand comfortably? 8/21- unlimited    How long can you walk comfortably? 8/21- household distances without cane, few hundred feet with cane    Patient Stated Goals walk normally, get back to prior level of activity and take care of herself    Currently in Pain? No/denies            [facility] PT Assessment - 12/02/16 0001      Assessment   Medical Diagnosis achilles tendon contracture    Referring Provider Meridee Score V    Onset Date/Surgical Date 07/14/16   Next MD Visit has been released from Dr. Sharol Given      Precautions   Precautions Fall     Restrictions   Weight Bearing Restrictions No     Balance Screen   Has the patient fallen in the past 6 months Yes   How many times? 1   Has the patient had a decrease in activity level because of a fear of falling?  No   Is the patient reluctant to leave their home because of a fear of falling?  No     Prior Function   Level of Independence Independent;Independent with gait;Independent with transfers;Independent with basic ADLs   Vocation Retired     AROM   Right Ankle Dorsiflexion 16   Right Ankle  Plantar Flexion 63   Right Ankle Inversion 48   Right Ankle Eversion 15   Left Ankle Dorsiflexion 5   Left Ankle Plantar Flexion 43   Left Ankle Inversion 16   Left Ankle Eversion 5     Strength   Right Knee Flexion 4+/5   Right Knee Extension 4+/5   Left Knee Flexion 4/5   Left Knee Extension 4+/5   Right Ankle Dorsiflexion 5/5   Right Ankle Plantar Flexion 4+/5   Right Ankle Inversion 5/5   Right Ankle Eversion 5/5   Left Ankle Dorsiflexion 5/5   Left Ankle Plantar Flexion 4-/5   Left Ankle Inversion 5/5   Left Ankle Eversion 4+/5     Standardized Balance Assessment   Standardized Balance Assessment Dynamic Gait Index     Dynamic Gait Index   Level Surface Normal   Change in Gait Speed Normal   Gait with Horizontal Head Turns Normal   Gait with Vertical  Head Turns Normal   Gait and Pivot Turn Normal   Step Over Obstacle Mild Impairment   Step Around Obstacles Mild Impairment   Steps Moderate Impairment   Total Score 20   DGI comment: 83%                              PT Education - 12/02/16 1718    Education provided Yes   Education Details progress with skilled PT services, POC moving forward, compression stocking measures and please call compression outlet back again    Person(s) Educated Patient   Methods Explanation   Comprehension Verbalized understanding          PT Short Term Goals - 12/02/16 1506      PT SHORT TERM GOAL #1   Title Patient to demonstrate L ankle ROM as being within 8 degrees of that of R on all measured planes in order to improve mechanics and symmetry of gait pattern    Baseline 8/21- close with DF but remains a bit stiffer overall    Time 4   Period Weeks   Status Partially Met     PT SHORT TERM GOAL #2   Title Patient to consistently be using heel-toe gait pattern with walker in order to improve efficiency of gait and promote normalized ankle mechancis    Baseline 8/21- heel toe with SBQC    Time 4   Period Weeks   Status Achieved     PT SHORT TERM GOAL #3   Title Patient to be able to verbalize proper skin care and shoe fit principles in order to prevent worsening or formation of another area of skin breakdown on posterior L ankle    Baseline 8/21- doing well with this    Time 4   Period Weeks   Status Achieved     PT SHORT TERM GOAL #4   Title Patient to corrrectly and consistently perform appropriate HEP, to be updated PRN    Baseline 8/21- doing HEP every day, PT remains unsure of correct form    Time 1   Period Weeks   Status Partially Met           PT Long Term Goals - 12/02/16 1508      PT LONG TERM GOAL #1   Title Patient to show improvement in muscle strength as having improved by at least 1 MMT grade in all tested groups in order to show improved gait  and  balance skills    Baseline 8/21- ankle remains limited by stiffness but fairly strong within available ROM; LEs need work on strength moving forward    Time 8   Period Weeks   Status Partially Met     PT LONG TERM GOAL #2   Title Patient to be able to ambulate community distances with LRAD and household distances with no device and consistent heel-toe pattern with equal stance times/step lengths in order to improve safe mobility and community access    Baseline 8/21- improving but has not met goal    Time 8   Period Weeks   Status Partially Met     PT LONG TERM GOAL #3   Title Patient to be able to complete TUG test within 12 seconds with LRAD and will also be able to maintain SLS for at least 20 seconds on solid surface each LE in order to show improved mobility and balance    Baseline 8/21- DGI 20 seconds    Time 8   Period Weeks   Status Partially Met     PT LONG TERM GOAL #4   Title Patient to be able to ascend/descend full flight of stairs with U railing, minimal unsteadiness in order to show improved ankle ROM and muscle control for safe home/community navigation    Baseline 8/21- especially descent is limited    Time 8   Period Weeks   Status On-going     PT LONG TERM GOAL #5   Title Patient to report she has been able to return to all PLOF based tasks with minimal unsteadiness and no pain increase in order to improve QOL    Baseline 8/21- doing well    Time 8   Period Weeks   Status Achieved               Plan - 12/02/16 1719    Clinical Impression Statement Re-assessment performed today. Patient is doing very well with skilled PT services and has made improvements in gait mechanics and tolerance, as well as balance as evidenced by relatively high scoring on DGI today. She does, however continue to demonstrate significant ankle stiffness as well as weakness in B LEs and ongoing difficulty with PLOF based tasks at this point. Recommend extension of skilled PT  services with ongoing focus on functional impairments before likely DC to home program.    Rehab Potential Good   Clinical Impairments Affecting Rehab Potential (+) high functional level prior to injury, motivated to attempt to return to PLOF; (-) chronicity of current impairment/surgical intervention with regression since surgery per referring MD for this impairment    PT Frequency 2x / week   PT Duration 4 weeks   PT Treatment/Interventions Biofeedback;Cryotherapy;Electrical Stimulation;Moist Heat;Ultrasound;DME Instruction;Gait training;Stair training;Functional mobility training;Therapeutic activities;Therapeutic exercise;Balance training;Neuromuscular re-education;Patient/family education;Orthotic Fit/Training;Manual techniques;Scar mobilization;Passive range of motion;Dry needling;Taping;ADLs/Self Care Home Management   PT Next Visit Plan increase focus on CKC strength and balance, include PF strengtheing. Continue manual and stretching for ankle. REciprocal stair training. May trial [facility] as able. F/U on compression stockigns. Update HEP    PT Home Exercise Plan Eval: gastroc stretch, supine ankle PF/DF and supination, ankle circles, heel-toe walking; 7/30: SLS, tandem stance, tandem gait at kitchen sink    Consulted and Agree with Plan of Care Patient      Patient will benefit from skilled therapeutic intervention in order to improve the following deficits and impairments:  Abnormal gait, Decreased skin integrity, Increased fascial restricitons, Decreased coordination, Decreased mobility,  Decreased range of motion, Decreased strength, Hypomobility, Decreased balance, Difficulty walking, Impaired flexibility  Visit Diagnosis: Stiffness of left ankle, not elsewhere classified - Plan: PT plan of care cert/re-cert  Other abnormalities of gait and mobility - Plan: PT plan of care cert/re-cert  Contracture of muscle, multiple sites - Plan: PT plan of care cert/re-cert  Difficulty in walking, not  elsewhere classified - Plan: PT plan of care cert/re-cert  Muscle weakness (generalized) - Plan: PT plan of care cert/re-cert       G-Codes - December 15, 2016 1720    Functional Assessment Tool Used (Outpatient Only) Based on skilled clinical assessment of ROM, gait, strength, general balance and mobility    Functional Limitation Mobility: Walking and moving around   Mobility: Walking and Moving Around Current Status ([id number]) At least 20 percent but less than 40 percent impaired, limited or restricted   Mobility: Walking and Moving Around Goal Status [phone]) At least 20 percent but less than 40 percent impaired, limited or restricted      Problem List Patient Active Problem List   Diagnosis Date Noted  . Achilles tendon contracture, left 09/16/2016  . Flexion contracture of joint of left lower leg 08/25/2016  . Avulsion fracture of calcaneus with routine healing, left 08/25/2016    Deniece Ree PT, DPT [place] [address] [place], [name], [address] Phone: [phone]   Fax:  [phone]  Name: Paige Glass MRN: [id number] Date of Birth: [birth year]

## 2016-12-03 DIAGNOSIS — H35341 Macular cyst, hole, or pseudohole, right eye: Secondary | ICD-10-CM | POA: Diagnosis not present

## 2016-12-03 DIAGNOSIS — H2512 Age-related nuclear cataract, left eye: Secondary | ICD-10-CM | POA: Diagnosis not present

## 2016-12-03 DIAGNOSIS — H2511 Age-related nuclear cataract, right eye: Secondary | ICD-10-CM | POA: Diagnosis not present

## 2016-12-04 ENCOUNTER — Ambulatory Visit (HOSPITAL_COMMUNITY): Payer: PPO | Admitting: Physical Therapy

## 2016-12-04 DIAGNOSIS — R2689 Other abnormalities of gait and mobility: Secondary | ICD-10-CM

## 2016-12-04 DIAGNOSIS — M6249 Contracture of muscle, multiple sites: Secondary | ICD-10-CM

## 2016-12-04 DIAGNOSIS — M25672 Stiffness of left ankle, not elsewhere classified: Secondary | ICD-10-CM | POA: Diagnosis not present

## 2016-12-04 DIAGNOSIS — M6281 Muscle weakness (generalized): Secondary | ICD-10-CM

## 2016-12-04 DIAGNOSIS — R262 Difficulty in walking, not elsewhere classified: Secondary | ICD-10-CM

## 2016-12-04 NOTE — Patient Instructions (Signed)
   Stand to [employer] in standing near the front of the chair. Cross your arms over your chest or out of front of you (so they don't assist you). Very SLOWLY (should take you 4 seconds), start sitting. -Keep your bottom back -Don't let your knees travel past your toes -Keep knee's pointing forward (don't let touch) -Have your feet firmly planted in ground - Do NOT let your knees collapse in; tie your red TB around your legs to help with this   Repeat 15 times, 2 times a day.    FRONT STEP-UPS  Step up onto stool or step with involved leg.  Step down leading with uninvolved leg.  Repeat 10-15 times each leg, twice a day.    LATERAL STEP-UPS  Stand on stool or step with involved leg.  Lower the uninvolved leg until the heel touches the floor, then press back up using the muscles in the involved leg only.  Repeat 10-15 times each leg, 2 times per day.

## 2016-12-04 NOTE — Therapy (Signed)
[place], [name], [address] Phone: [phone]   Fax:  [phone]  Physical Therapy Treatment  Patient Details  Name: Paige Glass MRN: [id number] Date of Birth: August 04, 1940 Referring Provider: Newt Minion   Encounter Date: 12/04/2016      PT End of Session - 12/04/16 1514    Visit Number 18   Number of Visits 24   Date for PT Re-Evaluation 12/30/16   Authorization Type [employer]  (G-codes done 17th session)   Authorization Time Period 1/77/93 to 12/16/98; recert done 9/23   Authorization - Visit Number 18   Authorization - Number of Visits 27   PT Start Time 3007   PT Stop Time 1514   PT Time Calculation (min) 40 min   Activity Tolerance Patient tolerated treatment well   Behavior During Therapy [facility] for tasks assessed/performed      Past Medical History:  Diagnosis Date  . Back pain   . Joint pain   . Joint swelling   . Nocturia   . Osteoporosis     Past Surgical History:  Procedure Laterality Date  . bilateral ear surgery    . OPEN REDUCTION INTERNAL FIXATION (ORIF) FOOT LISFRANC FRACTURE Left 07/14/2016   Procedure: OPEN REDUCTION INTERNAL FIXATION (ORIF) FOOT LISFRANC FRACTURE;  Surgeon: Meredith Pel, MD;  Location: [facility];  Service: Orthopedics;  Laterality: Left;  . TRIGGER FINGER RELEASE Right     There were no vitals filed for this visit.      Subjective Assessment - 12/04/16 1436    Subjective Patient arrives today stating that she is doing well, but not sure if she will be able to make her appointment on Monday due to an eye doctor appointment    Pertinent History avulsion fracture L heel, achilles contracture, osteoporosis, no other significant PMH    Patient Stated Goals walk normally, get back to prior level of activity and take care of herself    Currently in Pain? No/denies                         [facility] Adult PT Treatment/Exercise - 12/04/16  0001      Ambulation/Gait   Ambulation/Gait Yes   Ambulation/Gait Assistance 5: Supervision   Ambulation Distance (Feet) 226 Feet   Assistive device Straight cane   Gait Pattern Step-through pattern;Decreased step length - right;Decreased stance time - left   Stairs Yes   Stairs Assistance 5: Supervision   Number of Stairs 7   Height of Stairs 4   Gait Comments cues to reducce tendency for hip rotation with stair descent possibly due to ankle stiffness/eccentric weakness      Knee/Hip Exercises: Standing   Heel Raises Both;1 set;20 reps   Heel Raises Limitations heel and toe    Forward Lunges --   Forward Lunges Limitations --   Lateral Step Up Both;1 set;10 reps   Lateral Step Up Limitations 6 inch box U HHA    Forward Step Up Both;1 set;10 reps   Forward Step Up Limitations 6 inch box U HHA    Step Down Both;1 set;15 reps   Step Down Limitations 4 inch box U HHA    Functional Squat 1 set;10 reps   Functional Squat Limitations reverse chair for form   moderate difficulty with form despite verbal and tactile cue   Other Standing Knee Exercises 3 way hip holds x10 each LE, 3 second  holds B      Knee/Hip Exercises: Seated   Sit to Sand 1 set;15 reps;without UE support  eccentric lower, red TB around knees             Balance Exercises - 12/04/16 1500      Balance Exercises: Standing   Tandem Stance Eyes open;Foam/compliant surface;3 reps;20 secs   SLS Eyes open;Solid surface;3 reps           PT Education - 12/04/16 1514    Education provided Yes   Education Details may progress to [facility] at home, HEP updates   Person(s) Educated Patient   Methods Explanation   Comprehension Verbalized understanding          PT Short Term Goals - 12/02/16 1506      PT SHORT TERM GOAL #1   Title Patient to demonstrate L ankle ROM as being within 8 degrees of that of R on all measured planes in order to improve mechanics and symmetry of gait pattern    Baseline 8/21- close  with DF but remains a bit stiffer overall    Time 4   Period Weeks   Status Partially Met     PT SHORT TERM GOAL #2   Title Patient to consistently be using heel-toe gait pattern with walker in order to improve efficiency of gait and promote normalized ankle mechancis    Baseline 8/21- heel toe with SBQC    Time 4   Period Weeks   Status Achieved     PT SHORT TERM GOAL #3   Title Patient to be able to verbalize proper skin care and shoe fit principles in order to prevent worsening or formation of another area of skin breakdown on posterior L ankle    Baseline 8/21- doing well with this    Time 4   Period Weeks   Status Achieved     PT SHORT TERM GOAL #4   Title Patient to corrrectly and consistently perform appropriate HEP, to be updated PRN    Baseline 8/21- doing HEP every day, PT remains unsure of correct form    Time 1   Period Weeks   Status Partially Met           PT Long Term Goals - 12/02/16 1508      PT LONG TERM GOAL #1   Title Patient to show improvement in muscle strength as having improved by at least 1 MMT grade in all tested groups in order to show improved gait and balance skills    Baseline 8/21- ankle remains limited by stiffness but fairly strong within available ROM; LEs need work on strength moving forward    Time 8   Period Weeks   Status Partially Met     PT LONG TERM GOAL #2   Title Patient to be able to ambulate community distances with LRAD and household distances with no device and consistent heel-toe pattern with equal stance times/step lengths in order to improve safe mobility and community access    Baseline 8/21- improving but has not met goal    Time 8   Period Weeks   Status Partially Met     PT LONG TERM GOAL #3   Title Patient to be able to complete TUG test within 12 seconds with LRAD and will also be able to maintain SLS for at least 20 seconds on solid surface each LE in order to show improved mobility and balance    Baseline  8/21- DGI  20 seconds    Time 8   Period Weeks   Status Partially Met     PT LONG TERM GOAL #4   Title Patient to be able to ascend/descend full flight of stairs with U railing, minimal unsteadiness in order to show improved ankle ROM and muscle control for safe home/community navigation    Baseline 8/21- especially descent is limited    Time 8   Period Weeks   Status On-going     PT LONG TERM GOAL #5   Title Patient to report she has been able to return to all PLOF based tasks with minimal unsteadiness and no pain increase in order to improve QOL    Baseline 8/21- doing well    Time 8   Period Weeks   Status Achieved               Plan - 12/04/16 1515    Clinical Impression Statement Continued to progress CKC strengthening and balance work today; also continued to practice gait with SPC and performed reciprocal stair training as time allowed this session. HEP updates with focus on functional strengthening especially for LE musculature.     Rehab Potential Good   Clinical Impairments Affecting Rehab Potential (+) high functional level prior to injury, motivated to attempt to return to PLOF; (-) chronicity of current impairment/surgical intervention with regression since surgery per referring MD for this impairment    PT Frequency 2x / week   PT Duration 4 weeks   PT Treatment/Interventions Biofeedback;Cryotherapy;Electrical Stimulation;Moist Heat;Ultrasound;DME Instruction;Gait training;Stair training;Functional mobility training;Therapeutic activities;Therapeutic exercise;Balance training;Neuromuscular re-education;Patient/family education;Orthotic Fit/Training;Manual techniques;Scar mobilization;Passive range of motion;Dry needling;Taping;ADLs/Self Care Home Management   PT Next Visit Plan increase focus on CKC strength and balance, include PF strengtheing. Continue manual and stretching for ankle. REciprocal stair training. May trial [facility] as able. F/U on compression stockigns.  Update HEP    PT Home Exercise Plan Eval: gastroc stretch, supine ankle PF/DF and supination, ankle circles, heel-toe walking; 7/30: SLS, tandem stance, tandem gait at kitchen sink    Consulted and Agree with Plan of Care Patient      Patient will benefit from skilled therapeutic intervention in order to improve the following deficits and impairments:  Abnormal gait, Decreased skin integrity, Increased fascial restricitons, Decreased coordination, Decreased mobility, Decreased range of motion, Decreased strength, Hypomobility, Decreased balance, Difficulty walking, Impaired flexibility  Visit Diagnosis: Stiffness of left ankle, not elsewhere classified  Other abnormalities of gait and mobility  Contracture of muscle, multiple sites  Difficulty in walking, not elsewhere classified  Muscle weakness (generalized)     Problem List Patient Active Problem List   Diagnosis Date Noted  . Achilles tendon contracture, left 09/16/2016  . Flexion contracture of joint of left lower leg 08/25/2016  . Avulsion fracture of calcaneus with routine healing, left 08/25/2016    Deniece Ree PT, DPT [place] [address] [place], [name], [address] Phone: [phone]   Fax:  [phone]  Name: Paige Glass MRN: [id number] Date of Birth: Jun 20, 1940

## 2016-12-08 ENCOUNTER — Telehealth (HOSPITAL_COMMUNITY): Payer: Self-pay | Admitting: Physical Therapy

## 2016-12-08 ENCOUNTER — Ambulatory Visit (HOSPITAL_COMMUNITY): Payer: PPO | Admitting: Physical Therapy

## 2016-12-08 DIAGNOSIS — H2513 Age-related nuclear cataract, bilateral: Secondary | ICD-10-CM | POA: Diagnosis not present

## 2016-12-08 DIAGNOSIS — H35341 Macular cyst, hole, or pseudohole, right eye: Secondary | ICD-10-CM | POA: Diagnosis not present

## 2016-12-08 DIAGNOSIS — H43811 Vitreous degeneration, right eye: Secondary | ICD-10-CM | POA: Diagnosis not present

## 2016-12-08 NOTE — Telephone Encounter (Signed)
Pt cx today 12/08/16 and requested to come tomorrow since she had an eye apptment today. NF 12/08/16

## 2016-12-09 ENCOUNTER — Ambulatory Visit (HOSPITAL_COMMUNITY): Payer: PPO | Admitting: Physical Therapy

## 2016-12-09 DIAGNOSIS — M6249 Contracture of muscle, multiple sites: Secondary | ICD-10-CM

## 2016-12-09 DIAGNOSIS — M25672 Stiffness of left ankle, not elsewhere classified: Secondary | ICD-10-CM

## 2016-12-09 DIAGNOSIS — M6281 Muscle weakness (generalized): Secondary | ICD-10-CM

## 2016-12-09 DIAGNOSIS — R2689 Other abnormalities of gait and mobility: Secondary | ICD-10-CM

## 2016-12-09 DIAGNOSIS — R262 Difficulty in walking, not elsewhere classified: Secondary | ICD-10-CM

## 2016-12-09 NOTE — Therapy (Signed)
[place], [name], [address] Phone: [phone]   Fax:  [phone]  Physical Therapy Treatment  Patient Details  Name: Paige Glass MRN: [id number] Date of Birth: [birth year] Referring Provider: Newt Minion   Encounter Date: 12/09/2016      PT End of Session - 12/09/16 1602    Visit Number 19   Number of Visits 24   Date for PT Re-Evaluation 12/30/16   Authorization Type [employer]  (G-codes done 17th session)   Authorization Time Period 8/81/10 to 06/26/92; recert done 5/85   Authorization - Visit Number 82   Authorization - Number of Visits 27   PT Start Time 1518   PT Stop Time 1600   PT Time Calculation (min) 42 min   Equipment Utilized During Treatment Gait belt   Activity Tolerance Patient tolerated treatment well   Behavior During Therapy [facility] for tasks assessed/performed      Past Medical History:  Diagnosis Date  . Back pain   . Joint pain   . Joint swelling   . Nocturia   . Osteoporosis     Past Surgical History:  Procedure Laterality Date  . bilateral ear surgery    . OPEN REDUCTION INTERNAL FIXATION (ORIF) FOOT LISFRANC FRACTURE Left 07/14/2016   Procedure: OPEN REDUCTION INTERNAL FIXATION (ORIF) FOOT LISFRANC FRACTURE;  Surgeon: Meredith Pel, MD;  Location: [place];  Service: Orthopedics;  Laterality: Left;  . TRIGGER FINGER RELEASE Right     There were no vitals filed for this visit.      Subjective Assessment - 12/09/16 1518    Subjective Pt states that she is walking without a cane in her  House.   Comes in with both a quad and single point cane.    Pertinent History avulsion fracture L heel, achilles contracture, osteoporosis, no other significant PMH    Patient Stated Goals walk normally, get back to prior level of activity and take care of herself    Currently in Pain? No/denies                         [facility] Adult PT Treatment/Exercise -  12/09/16 0001      Ambulation/Gait   Number of Stairs 28   Height of Stairs 4   Gait Comments with single point cane      Exercises   Exercises Ankle     Knee/Hip Exercises: Standing   Rocker Board 1 minute   Other Standing Knee Exercises wall arch up on toes      Ankle Exercises: Stretches   Plantar Fascia Stretch 3 reps;30 seconds   Slant Board Stretch 3 reps;30 seconds     Ankle Exercises: Standing   Heel Walk (Round Trip) 2   Toe Walk (Round Trip) 2             Balance Exercises - 12/09/16 1531      Balance Exercises: Standing   SLS Eyes open;Solid surface;3 reps  with head turns    Balance Beam tandem gait on balance beam 2RT   Sidestepping Foam/compliant support;2 reps   Marching Limitations 10             PT Short Term Goals - 12/02/16 1506      PT SHORT TERM GOAL #1   Title Patient to demonstrate L ankle ROM as being within 8 degrees of that of R on all measured planes in order  to improve mechanics and symmetry of gait pattern    Baseline 8/21- close with DF but remains a bit stiffer overall    Time 4   Period Weeks   Status Partially Met     PT SHORT TERM GOAL #2   Title Patient to consistently be using heel-toe gait pattern with walker in order to improve efficiency of gait and promote normalized ankle mechancis    Baseline 8/21- heel toe with SBQC    Time 4   Period Weeks   Status Achieved     PT SHORT TERM GOAL #3   Title Patient to be able to verbalize proper skin care and shoe fit principles in order to prevent worsening or formation of another area of skin breakdown on posterior L ankle    Baseline 8/21- doing well with this    Time 4   Period Weeks   Status Achieved     PT SHORT TERM GOAL #4   Title Patient to corrrectly and consistently perform appropriate HEP, to be updated PRN    Baseline 8/21- doing HEP every day, PT remains unsure of correct form    Time 1   Period Weeks   Status Partially Met           PT Long Term  Goals - 12/02/16 1508      PT LONG TERM GOAL #1   Title Patient to show improvement in muscle strength as having improved by at least 1 MMT grade in all tested groups in order to show improved gait and balance skills    Baseline 8/21- ankle remains limited by stiffness but fairly strong within available ROM; LEs need work on strength moving forward    Time 8   Period Weeks   Status Partially Met     PT LONG TERM GOAL #2   Title Patient to be able to ambulate community distances with LRAD and household distances with no device and consistent heel-toe pattern with equal stance times/step lengths in order to improve safe mobility and community access    Baseline 8/21- improving but has not met goal    Time 8   Period Weeks   Status Partially Met     PT LONG TERM GOAL #3   Title Patient to be able to complete TUG test within 12 seconds with LRAD and will also be able to maintain SLS for at least 20 seconds on solid surface each LE in order to show improved mobility and balance    Baseline 8/21- DGI 20 seconds    Time 8   Period Weeks   Status Partially Met     PT LONG TERM GOAL #4   Title Patient to be able to ascend/descend full flight of stairs with U railing, minimal unsteadiness in order to show improved ankle ROM and muscle control for safe home/community navigation    Baseline 8/21- especially descent is limited    Time 8   Period Weeks   Status On-going     PT LONG TERM GOAL #5   Title Patient to report she has been able to return to all PLOF based tasks with minimal unsteadiness and no pain increase in order to improve QOL    Baseline 8/21- doing well    Time 8   Period Weeks   Status Achieved               Plan - 12/09/16 1603    Clinical Impression Statement Pt has ordered compression garments  but they have not been delivered.  Pt Gait trained with spc as well as completed steps with SPC with good technique after verbal cuing.  Focused treatment primarily on  balance and posture today.    Rehab Potential Good   Clinical Impairments Affecting Rehab Potential (+) high functional level prior to injury, motivated to attempt to return to PLOF; (-) chronicity of current impairment/surgical intervention with regression since surgery per referring MD for this impairment    PT Frequency 2x / week   PT Duration 4 weeks   PT Treatment/Interventions Biofeedback;Cryotherapy;Electrical Stimulation;Moist Heat;Ultrasound;DME Instruction;Gait training;Stair training;Functional mobility training;Therapeutic activities;Therapeutic exercise;Balance training;Neuromuscular re-education;Patient/family education;Orthotic Fit/Training;Manual techniques;Scar mobilization;Passive range of motion;Dry needling;Taping;ADLs/Self Care Home Management   PT Next Visit Plan add stepping over hurdles to foam; begin vector stances and increase to 7"step.    PT Home Exercise Plan Eval: gastroc stretch, supine ankle PF/DF and supination, ankle circles, heel-toe walking; 7/30: SLS, tandem stance, tandem gait at kitchen sink    Consulted and Agree with Plan of Care Patient      Patient will benefit from skilled therapeutic intervention in order to improve the following deficits and impairments:  Abnormal gait, Decreased skin integrity, Increased fascial restricitons, Decreased coordination, Decreased mobility, Decreased range of motion, Decreased strength, Hypomobility, Decreased balance, Difficulty walking, Impaired flexibility  Visit Diagnosis: Stiffness of left ankle, not elsewhere classified  Other abnormalities of gait and mobility  Contracture of muscle, multiple sites  Difficulty in walking, not elsewhere classified  Muscle weakness (generalized)     Problem List Patient Active Problem List   Diagnosis Date Noted  . Achilles tendon contracture, left 09/16/2016  . Flexion contracture of joint of left lower leg 08/25/2016  . Avulsion fracture of calcaneus with routine  healing, left 08/25/2016    Rayetta Humphrey, PT CLT [phone] 12/09/2016, 4:06 PM  [place], [name], [address] Phone: [phone]   Fax:  [phone]  Name: Paige Glass MRN: [id number] Date of Birth: [birth year]

## 2016-12-11 ENCOUNTER — Ambulatory Visit (HOSPITAL_COMMUNITY): Payer: PPO | Admitting: Physical Therapy

## 2016-12-11 DIAGNOSIS — R262 Difficulty in walking, not elsewhere classified: Secondary | ICD-10-CM

## 2016-12-11 DIAGNOSIS — M6281 Muscle weakness (generalized): Secondary | ICD-10-CM

## 2016-12-11 DIAGNOSIS — M25672 Stiffness of left ankle, not elsewhere classified: Secondary | ICD-10-CM

## 2016-12-11 DIAGNOSIS — R2689 Other abnormalities of gait and mobility: Secondary | ICD-10-CM

## 2016-12-11 DIAGNOSIS — M6249 Contracture of muscle, multiple sites: Secondary | ICD-10-CM

## 2016-12-11 NOTE — Therapy (Signed)
[place], [name], [address] Phone: [phone]   Fax:  [phone]  Physical Therapy Treatment  Patient Details  Name: Paige Glass MRN: [id number] Date of Birth: 05-11-1940 Referring Provider: Newt Minion   Encounter Date: 12/11/2016      PT End of Session - 12/11/16 6568    Visit Number 20   Number of Visits 24   Date for PT Re-Evaluation 12/30/16   Authorization Type [employer]  (G-codes done 17th session)   Authorization Time Period 05/10/49 to 7/00/17; recert done 4/94   Authorization - Visit Number 23   Authorization - Number of Visits 27   PT Start Time 4967   PT Stop Time 1511   PT Time Calculation (min) 40 min   Equipment Utilized During Treatment Gait belt   Activity Tolerance Patient tolerated treatment well   Behavior During Therapy [facility] for tasks assessed/performed      Past Medical History:  Diagnosis Date  . Back pain   . Joint pain   . Joint swelling   . Nocturia   . Osteoporosis     Past Surgical History:  Procedure Laterality Date  . bilateral ear surgery    . OPEN REDUCTION INTERNAL FIXATION (ORIF) FOOT LISFRANC FRACTURE Left 07/14/2016   Procedure: OPEN REDUCTION INTERNAL FIXATION (ORIF) FOOT LISFRANC FRACTURE;  Surgeon: Meredith Pel, MD;  Location: [place];  Service: Orthopedics;  Laterality: Left;  . TRIGGER FINGER RELEASE Right     There were no vitals filed for this visit.      Subjective Assessment - 12/11/16 1434    Subjective Patient arrives with her SPC, stating she is doing well, main concerns are still strength and balance    Pertinent History avulsion fracture L heel, achilles contracture, osteoporosis, no other significant PMH    Patient Stated Goals walk normally, get back to prior level of activity and take care of herself    Currently in Pain? No/denies                         [facility] Adult PT Treatment/Exercise - 12/11/16 0001       Knee/Hip Exercises: Standing   Forward Lunges Both;1 set;15 reps   Forward Lunges Limitations 2 inch box    Lateral Step Up Both;1 set;15 reps   Lateral Step Up Limitations 8 inch box    Forward Step Up Both;1 set;15 reps   Forward Step Up Limitations 8 inch box    Step Down --   Step Down Limitations --   Functional Squat 1 set;10 reps   Functional Squat Limitations modified, cues for form    Other Standing Knee Exercises 3 way hip holds 1x10 each 3 second holds B HHA    Other Standing Knee Exercises hip hikes 1x10 B              Balance Exercises - 12/11/16 1515      Balance Exercises: Standing   Tandem Gait Forward;3 reps;Other (comment)  foam, full trips in // bars    Step Over Hurdles / Cones small hurdles forwards and lateral on foam in // bars    Other Standing Exercises cone taps on solid surface 4 cones/max combo of 4 cross midline            PT Education - 12/11/16 1516    Education provided No          PT  Short Term Goals - 12/02/16 1506      PT SHORT TERM GOAL #1   Title Patient to demonstrate L ankle ROM as being within 8 degrees of that of R on all measured planes in order to improve mechanics and symmetry of gait pattern    Baseline 8/21- close with DF but remains a bit stiffer overall    Time 4   Period Weeks   Status Partially Met     PT SHORT TERM GOAL #2   Title Patient to consistently be using heel-toe gait pattern with walker in order to improve efficiency of gait and promote normalized ankle mechancis    Baseline 8/21- heel toe with SBQC    Time 4   Period Weeks   Status Achieved     PT SHORT TERM GOAL #3   Title Patient to be able to verbalize proper skin care and shoe fit principles in order to prevent worsening or formation of another area of skin breakdown on posterior L ankle    Baseline 8/21- doing well with this    Time 4   Period Weeks   Status Achieved     PT SHORT TERM GOAL #4   Title Patient to corrrectly and  consistently perform appropriate HEP, to be updated PRN    Baseline 8/21- doing HEP every day, PT remains unsure of correct form    Time 1   Period Weeks   Status Partially Met           PT Long Term Goals - 12/02/16 1508      PT LONG TERM GOAL #1   Title Patient to show improvement in muscle strength as having improved by at least 1 MMT grade in all tested groups in order to show improved gait and balance skills    Baseline 8/21- ankle remains limited by stiffness but fairly strong within available ROM; LEs need work on strength moving forward    Time 8   Period Weeks   Status Partially Met     PT LONG TERM GOAL #2   Title Patient to be able to ambulate community distances with LRAD and household distances with no device and consistent heel-toe pattern with equal stance times/step lengths in order to improve safe mobility and community access    Baseline 8/21- improving but has not met goal    Time 8   Period Weeks   Status Partially Met     PT LONG TERM GOAL #3   Title Patient to be able to complete TUG test within 12 seconds with LRAD and will also be able to maintain SLS for at least 20 seconds on solid surface each LE in order to show improved mobility and balance    Baseline 8/21- DGI 20 seconds    Time 8   Period Weeks   Status Partially Met     PT LONG TERM GOAL #4   Title Patient to be able to ascend/descend full flight of stairs with U railing, minimal unsteadiness in order to show improved ankle ROM and muscle control for safe home/community navigation    Baseline 8/21- especially descent is limited    Time 8   Period Weeks   Status On-going     PT LONG TERM GOAL #5   Title Patient to report she has been able to return to all PLOF based tasks with minimal unsteadiness and no pain increase in order to improve QOL    Baseline 8/21- doing well  Time 8   Period Weeks   Status Achieved               Plan - 12/11/16 1516    Clinical Impression  Statement Continued with functional strengthening with progression of all activities as tolerated today, also continued to progress balance as able. Patient appears to be doing well with skilled PT services and remains very motivated to participate and progress herself moving forward.    Rehab Potential Good   Clinical Impairments Affecting Rehab Potential (+) high functional level prior to injury, motivated to attempt to return to PLOF; (-) chronicity of current impairment/surgical intervention with regression since surgery per referring MD for this impairment    PT Frequency 2x / week   PT Duration 4 weeks   PT Treatment/Interventions Biofeedback;Cryotherapy;Electrical Stimulation;Moist Heat;Ultrasound;DME Instruction;Gait training;Stair training;Functional mobility training;Therapeutic activities;Therapeutic exercise;Balance training;Neuromuscular re-education;Patient/family education;Orthotic Fit/Training;Manual techniques;Scar mobilization;Passive range of motion;Dry needling;Taping;ADLs/Self Care Home Management   PT Next Visit Plan introduce dynadiscs; continue to progress CKC strength and continue to correct squat form    PT Home Exercise Plan Eval: gastroc stretch, supine ankle PF/DF and supination, ankle circles, heel-toe walking; 7/30: SLS, tandem stance, tandem gait at kitchen sink    Consulted and Agree with Plan of Care Patient      Patient will benefit from skilled therapeutic intervention in order to improve the following deficits and impairments:  Abnormal gait, Decreased skin integrity, Increased fascial restricitons, Decreased coordination, Decreased mobility, Decreased range of motion, Decreased strength, Hypomobility, Decreased balance, Difficulty walking, Impaired flexibility  Visit Diagnosis: Stiffness of left ankle, not elsewhere classified  Other abnormalities of gait and mobility  Contracture of muscle, multiple sites  Difficulty in walking, not elsewhere  classified  Muscle weakness (generalized)     Problem List Patient Active Problem List   Diagnosis Date Noted  . Achilles tendon contracture, left 09/16/2016  . Flexion contracture of joint of left lower leg 08/25/2016  . Avulsion fracture of calcaneus with routine healing, left 08/25/2016    Deniece Ree PT, DPT [facility] [address] [place], [name], [address] Phone: [phone]   Fax:  [phone]  Name: Paige Glass MRN: [id number] Date of Birth: 02/26/1941

## 2016-12-16 ENCOUNTER — Ambulatory Visit (HOSPITAL_COMMUNITY): Payer: PPO | Attending: Orthopedic Surgery | Admitting: Physical Therapy

## 2016-12-16 DIAGNOSIS — M6249 Contracture of muscle, multiple sites: Secondary | ICD-10-CM | POA: Insufficient documentation

## 2016-12-16 DIAGNOSIS — M6281 Muscle weakness (generalized): Secondary | ICD-10-CM | POA: Diagnosis not present

## 2016-12-16 DIAGNOSIS — M25672 Stiffness of left ankle, not elsewhere classified: Secondary | ICD-10-CM | POA: Diagnosis not present

## 2016-12-16 DIAGNOSIS — R262 Difficulty in walking, not elsewhere classified: Secondary | ICD-10-CM | POA: Diagnosis not present

## 2016-12-16 DIAGNOSIS — R2689 Other abnormalities of gait and mobility: Secondary | ICD-10-CM | POA: Diagnosis not present

## 2016-12-16 NOTE — Therapy (Signed)
[facility] [address] [place], [name], [address] Phone: [phone]   Fax:  [phone]  Physical Therapy Treatment (Discharge)  Patient Details  Name: Paige Glass MRN: [id number] Date of Birth: 04/27/1940 Referring Provider: Newt Minion   Encounter Date: 12/16/2016      PT End of Session - 12/16/16 1504    Visit Number 21   Number of Visits 21   Authorization Type [employer]  (G-codes done 17th session)   Authorization Time Period 3/73/42 to 8/76/81; recert done 1/57   Authorization - Visit Number 21   Authorization - Number of Visits 27   PT Start Time 2620   PT Stop Time 1502  DC, no further skilled PT services indicated    PT Time Calculation (min) 29 min   Activity Tolerance Patient tolerated treatment well   Behavior During Therapy [facility] for tasks assessed/performed      Past Medical History:  Diagnosis Date  . Back pain   . Joint pain   . Joint swelling   . Nocturia   . Osteoporosis     Past Surgical History:  Procedure Laterality Date  . bilateral ear surgery    . OPEN REDUCTION INTERNAL FIXATION (ORIF) FOOT LISFRANC FRACTURE Left 07/14/2016   Procedure: OPEN REDUCTION INTERNAL FIXATION (ORIF) FOOT LISFRANC FRACTURE;  Surgeon: Meredith Pel, MD;  Location: [facility];  Service: Orthopedics;  Laterality: Left;  . TRIGGER FINGER RELEASE Right     There were no vitals filed for this visit.      Subjective Assessment - 12/16/16 1436    Subjective Patient arrives with [place], her major concern is just going down the stairs leading with her L foot and cnotinuing to get her strength back.    Pertinent History avulsion fracture L heel, achilles contracture, osteoporosis, no other significant PMH    How long can you sit comfortably? 9/4- unlimited    How long can you stand comfortably? 9/4- unlimited    How long can you walk comfortably? 9/4- household distances without cane, few hundred feet with cane     Patient Stated Goals walk normally, get back to prior level of activity and take care of herself    Currently in Pain? Yes   Pain Score 2    Pain Location Heel   Pain Orientation Left   Pain Descriptors / Indicators Discomfort   Pain Type Chronic pain   Pain Radiating Towards none    Pain Onset More than a month ago   Pain Frequency Constant   Aggravating Factors  nothing    Pain Relieving Factors nothing    Effect of Pain on Daily Activities none just annoying             [facility] PT Assessment - 12/16/16 0001      AROM   Right Ankle Dorsiflexion 15   Right Ankle Plantar Flexion 60   Right Ankle Inversion 49   Right Ankle Eversion 15   Left Ankle Dorsiflexion 8   Left Ankle Plantar Flexion 40   Left Ankle Inversion 18   Left Ankle Eversion 6     Strength   Right Knee Flexion 5/5   Right Knee Extension 5/5   Left Knee Flexion 4+/5   Left Knee Extension 4+/5   Right Ankle Dorsiflexion 5/5   Right Ankle Inversion 5/5   Left Ankle Dorsiflexion 5/5   Left Ankle Inversion 5/5   Left Ankle Eversion 5/5  Dynamic Gait Index   Level Surface Normal   Change in Gait Speed Normal   Gait with Horizontal Head Turns Normal   Gait with Vertical Head Turns Normal   Gait and Pivot Turn Normal   Step Over Obstacle Mild Impairment   Step Around Obstacles Mild Impairment   Steps Moderate Impairment   Total Score 20                             PT Education - 12/16/16 1504    Education provided Yes   Education Details goal review, progress with skilled PT services, DC today, [employer]    Person(s) Educated Patient   Methods Explanation;Handout   Comprehension Verbalized understanding          PT Short Term Goals - 12/16/16 1453      PT SHORT TERM GOAL #1   Title Patient to demonstrate L ankle ROM as being within 8 degrees of that of R on all measured planes in order to improve mechanics and symmetry of gait pattern    Baseline 9/4- close  but has not met on all planes    Time 4   Period Weeks   Status Partially Met     PT SHORT TERM GOAL #2   Title Patient to consistently be using heel-toe gait pattern with walker in order to improve efficiency of gait and promote normalized ankle mechancis    Baseline 9/4- gait with SPC    Time 4   Period Weeks   Status Achieved     PT SHORT TERM GOAL #3   Title Patient to be able to verbalize proper skin care and shoe fit principles in order to prevent worsening or formation of another area of skin breakdown on posterior L ankle    Time 4   Period Weeks   Status Achieved     PT SHORT TERM GOAL #4   Title Patient to corrrectly and consistently perform appropriate HEP, to be updated PRN    Time 1   Period Weeks   Status Partially Met           PT Long Term Goals - 12/16/16 1454      PT LONG TERM GOAL #1   Title Patient to show improvement in muscle strength as having improved by at least 1 MMT grade in all tested groups in order to show improved gait and balance skills    Time 8   Period Weeks   Status Achieved     PT LONG TERM GOAL #2   Title Patient to be able to ambulate community distances with LRAD and household distances with no device and consistent heel-toe pattern with equal stance times/step lengths in order to improve safe mobility and community access    Time 8   Period Weeks   Status Achieved     PT LONG TERM GOAL #3   Title Patient to be able to complete TUG test within 12 seconds with LRAD and will also be able to maintain SLS for at least 20 seconds on solid surface each LE in order to show improved mobility and balance    Baseline 9/4- SLS 20 seconds B, TUG 12 seconds no device, DGI 20   Time 8   Period Weeks   Status Achieved     PT LONG TERM GOAL #4   Title Patient to be able to ascend/descend full flight of stairs with U railing,  minimal unsteadiness in order to show improved ankle ROM and muscle control for safe home/community navigation     Baseline 01-14-23- no problem, has been practicing without railing on her own    Time 8   Period Weeks   Status Achieved     PT LONG TERM GOAL #5   Title Patient to report she has been able to return to all PLOF based tasks with minimal unsteadiness and no pain increase in order to improve QOL    Baseline 01-14-23- achieved    Time 8   Period Weeks   Status Achieved               Plan - 01-13-2017 1505    Clinical Impression Statement Early re-assessment performed today due to high performance during recent skilled PT sessions. Patient is doing very well and appears to have made functional progress towards her goals especially those set for the long term. She is satisfied with her current status and is able to do what she needs and wants at this point. Recommend DC to HEP and regular activity/exercise program at local gym facilities.    Rehab Potential Good   Clinical Impairments Affecting Rehab Potential (+) high functional level prior to injury, motivated to attempt to return to PLOF; (-) chronicity of current impairment/surgical intervention with regression since surgery per referring MD for this impairment    PT Next Visit Plan DC today    PT Home Exercise Plan Eval: gastroc stretch, supine ankle PF/DF and supination, ankle circles, heel-toe walking; 7/30: SLS, tandem stance, tandem gait at kitchen sink    Consulted and Agree with Plan of Care Patient      Patient will benefit from skilled therapeutic intervention in order to improve the following deficits and impairments:  Abnormal gait, Decreased skin integrity, Increased fascial restricitons, Decreased coordination, Decreased mobility, Decreased range of motion, Decreased strength, Hypomobility, Decreased balance, Difficulty walking, Impaired flexibility  Visit Diagnosis: Stiffness of left ankle, not elsewhere classified  Other abnormalities of gait and mobility  Contracture of muscle, multiple sites  Difficulty in walking, not  elsewhere classified  Muscle weakness (generalized)       G-Codes - January 13, 2017 1505    Functional Assessment Tool Used (Outpatient Only) Based on skilled clinical assessment of ROM, gait, strength, general balance and mobility    Functional Limitation Mobility: Walking and moving around   Mobility: Walking and Moving Around Goal Status [phone]) At least 20 percent but less than 40 percent impaired, limited or restricted   Mobility: Walking and Moving Around Discharge Status [phone]) At least 20 percent but less than 40 percent impaired, limited or restricted      Problem List Patient Active Problem List   Diagnosis Date Noted  . Achilles tendon contracture, left 09/16/2016  . Flexion contracture of joint of left lower leg 08/25/2016  . Avulsion fracture of calcaneus with routine healing, left 08/25/2016    PHYSICAL THERAPY DISCHARGE SUMMARY  Visits from Start of Care: 21  Current functional level related to goals / functional outcomes: Patient satisfied with her current level of function and is able to do all she needs and wants at this point. DC today.    Remaining deficits: See above    Education / Equipment: See above  Plan: Patient agrees to discharge.  Patient goals were partially met. Patient is being discharged due to being pleased with the current functional level.  ?????       Deniece Ree PT, DPT [phone]  [place], [name], [address] Phone: [phone]   Fax:  [phone]  Name: Suriya Kovarik Koeppen MRN: [id number] Date of Birth: Dec 24, 1940

## 2016-12-18 ENCOUNTER — Ambulatory Visit (HOSPITAL_COMMUNITY): Payer: PPO

## 2016-12-22 ENCOUNTER — Encounter (HOSPITAL_COMMUNITY): Payer: PPO | Admitting: Physical Therapy

## 2016-12-25 ENCOUNTER — Encounter (HOSPITAL_COMMUNITY): Payer: PPO | Admitting: Physical Therapy

## 2016-12-29 ENCOUNTER — Encounter (HOSPITAL_COMMUNITY): Payer: PPO | Admitting: Physical Therapy

## 2016-12-30 ENCOUNTER — Telehealth (HOSPITAL_COMMUNITY): Payer: Self-pay | Admitting: Physical Therapy

## 2016-12-30 NOTE — Telephone Encounter (Signed)
Pt called b/c her last 10.00 has not shown up on her credit card. I looked at [facility] ticket and it didn't have a HAR#. I called Delorse Lek and he could see it as undistributed. He posted the $10.00 payment to the patients account today. NF 12/30/16

## 2017-01-01 ENCOUNTER — Encounter (HOSPITAL_COMMUNITY): Payer: PPO | Admitting: Physical Therapy

## 2017-01-05 ENCOUNTER — Encounter (HOSPITAL_COMMUNITY): Payer: PPO | Admitting: Physical Therapy

## 2017-04-02 ENCOUNTER — Other Ambulatory Visit (HOSPITAL_COMMUNITY): Payer: Self-pay | Admitting: Internal Medicine

## 2017-04-02 DIAGNOSIS — Z1231 Encounter for screening mammogram for malignant neoplasm of breast: Secondary | ICD-10-CM

## 2017-04-03 DIAGNOSIS — M810 Age-related osteoporosis without current pathological fracture: Secondary | ICD-10-CM | POA: Diagnosis not present

## 2017-04-03 DIAGNOSIS — Z79899 Other long term (current) drug therapy: Secondary | ICD-10-CM | POA: Diagnosis not present

## 2017-04-03 DIAGNOSIS — D751 Secondary polycythemia: Secondary | ICD-10-CM | POA: Diagnosis not present

## 2017-04-08 ENCOUNTER — Ambulatory Visit (HOSPITAL_COMMUNITY)
Admission: RE | Admit: 2017-04-08 | Discharge: 2017-04-08 | Disposition: A | Discharge status: Home / Self Care | Payer: PPO | Source: Ambulatory Visit | Attending: Internal Medicine | Admitting: Internal Medicine

## 2017-04-08 DIAGNOSIS — Z1231 Encounter for screening mammogram for malignant neoplasm of breast: Secondary | ICD-10-CM | POA: Diagnosis not present

## 2017-04-13 DIAGNOSIS — Z6821 Body mass index (BMI) 21.0-21.9, adult: Secondary | ICD-10-CM | POA: Diagnosis not present

## 2017-04-13 DIAGNOSIS — Z23 Encounter for immunization: Secondary | ICD-10-CM | POA: Diagnosis not present

## 2017-04-13 DIAGNOSIS — N183 Chronic kidney disease, stage 3 (moderate): Secondary | ICD-10-CM | POA: Diagnosis not present

## 2017-04-13 DIAGNOSIS — M810 Age-related osteoporosis without current pathological fracture: Secondary | ICD-10-CM | POA: Diagnosis not present

## 2017-04-13 DIAGNOSIS — Z0001 Encounter for general adult medical examination with abnormal findings: Secondary | ICD-10-CM | POA: Diagnosis not present

## 2018-02-19 DIAGNOSIS — J019 Acute sinusitis, unspecified: Secondary | ICD-10-CM | POA: Diagnosis not present

## 2018-03-01 ENCOUNTER — Other Ambulatory Visit (HOSPITAL_COMMUNITY): Payer: Self-pay | Admitting: Internal Medicine

## 2018-03-01 DIAGNOSIS — Z1231 Encounter for screening mammogram for malignant neoplasm of breast: Secondary | ICD-10-CM

## 2018-04-09 ENCOUNTER — Ambulatory Visit (HOSPITAL_COMMUNITY)
Admission: RE | Admit: 2018-04-09 | Discharge: 2018-04-09 | Disposition: A | Discharge status: Home / Self Care | Payer: PPO | Source: Ambulatory Visit | Attending: Internal Medicine | Admitting: Internal Medicine

## 2018-04-09 DIAGNOSIS — N183 Chronic kidney disease, stage 3 (moderate): Secondary | ICD-10-CM | POA: Diagnosis not present

## 2018-04-09 DIAGNOSIS — Z1231 Encounter for screening mammogram for malignant neoplasm of breast: Secondary | ICD-10-CM | POA: Insufficient documentation

## 2018-04-09 DIAGNOSIS — Z79899 Other long term (current) drug therapy: Secondary | ICD-10-CM | POA: Diagnosis not present

## 2018-04-09 DIAGNOSIS — M810 Age-related osteoporosis without current pathological fracture: Secondary | ICD-10-CM | POA: Diagnosis not present

## 2018-04-09 DIAGNOSIS — D751 Secondary polycythemia: Secondary | ICD-10-CM | POA: Diagnosis not present

## 2018-04-16 DIAGNOSIS — Z6821 Body mass index (BMI) 21.0-21.9, adult: Secondary | ICD-10-CM | POA: Diagnosis not present

## 2018-04-16 DIAGNOSIS — D751 Secondary polycythemia: Secondary | ICD-10-CM | POA: Diagnosis not present

## 2018-04-16 DIAGNOSIS — N183 Chronic kidney disease, stage 3 (moderate): Secondary | ICD-10-CM | POA: Diagnosis not present

## 2018-04-16 DIAGNOSIS — M810 Age-related osteoporosis without current pathological fracture: Secondary | ICD-10-CM | POA: Diagnosis not present

## 2018-04-16 DIAGNOSIS — Z0001 Encounter for general adult medical examination with abnormal findings: Secondary | ICD-10-CM | POA: Diagnosis not present

## 2018-04-16 DIAGNOSIS — Z23 Encounter for immunization: Secondary | ICD-10-CM | POA: Diagnosis not present

## 2018-05-18 IMAGING — DX DG ANKLE COMPLETE 3+V*L*
3 series · 3 of 3 positions shown · non-contrast
Comparison: None

CLINICAL DATA: Fell down the steps landing on LEFT ankle, pains,
swelling, and bruising diffusely

EXAM:
LEFT ANKLE COMPLETE - 3+ VIEW

[ankle ap]
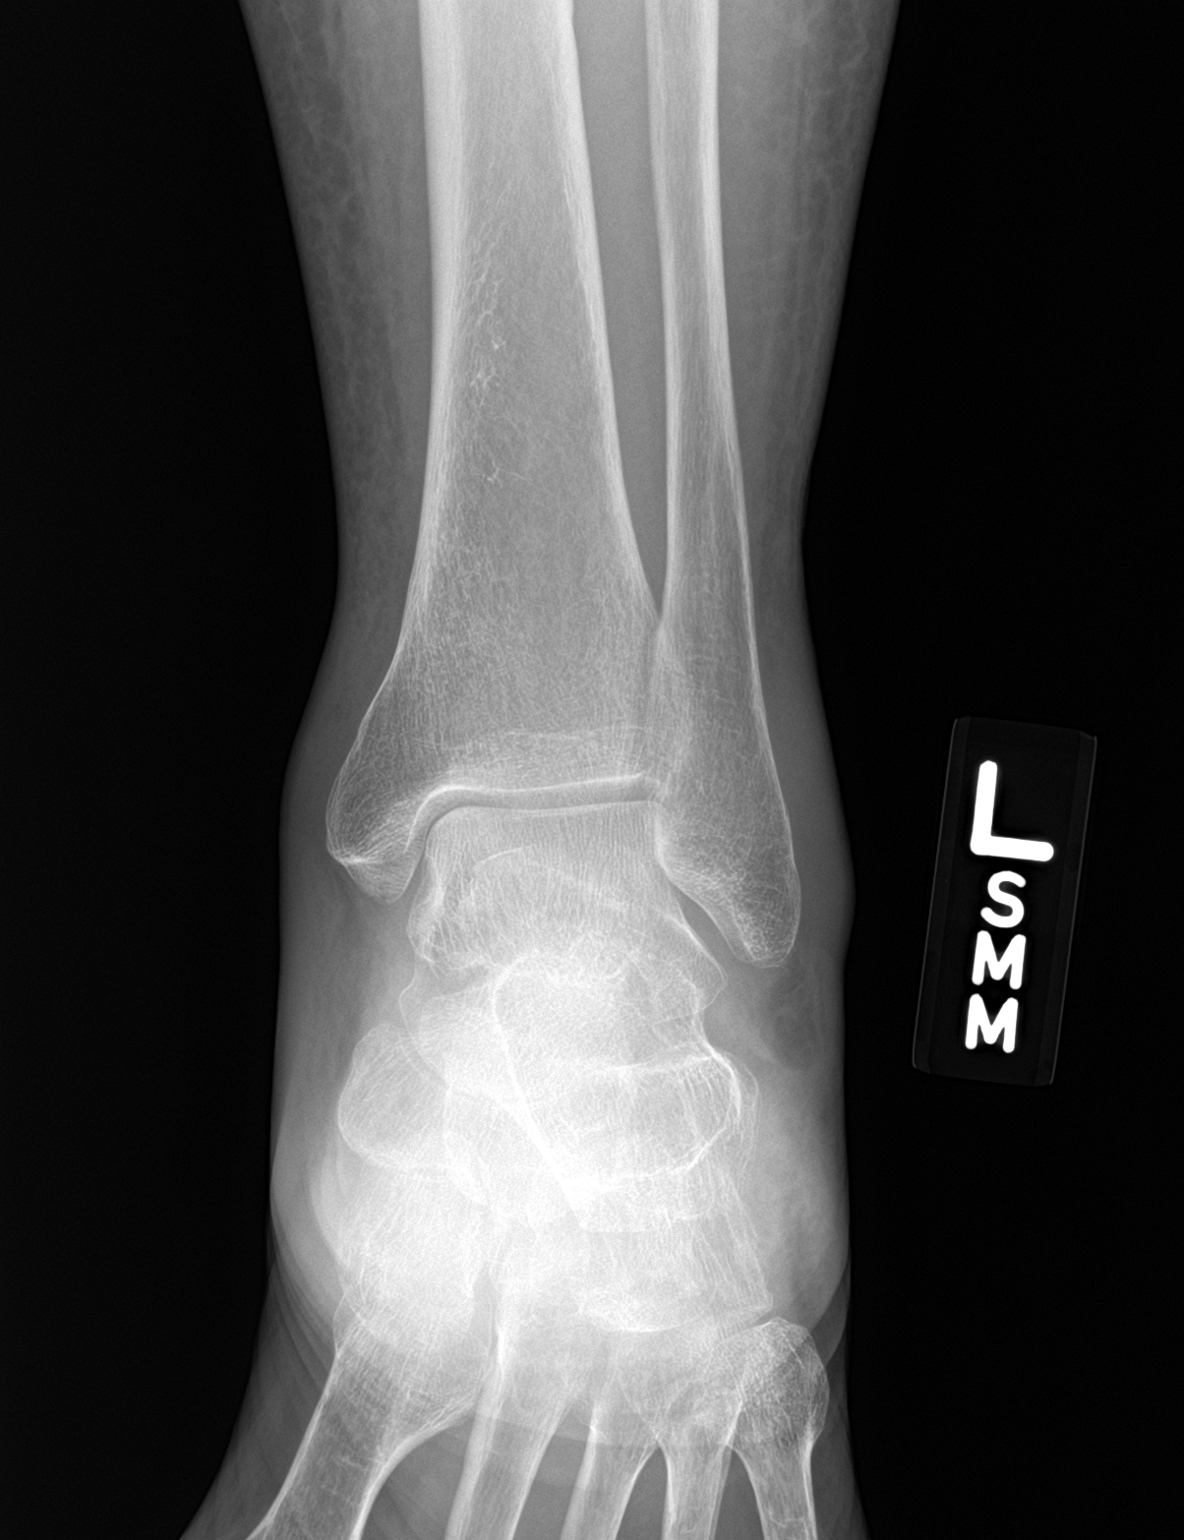

[ankle obl]
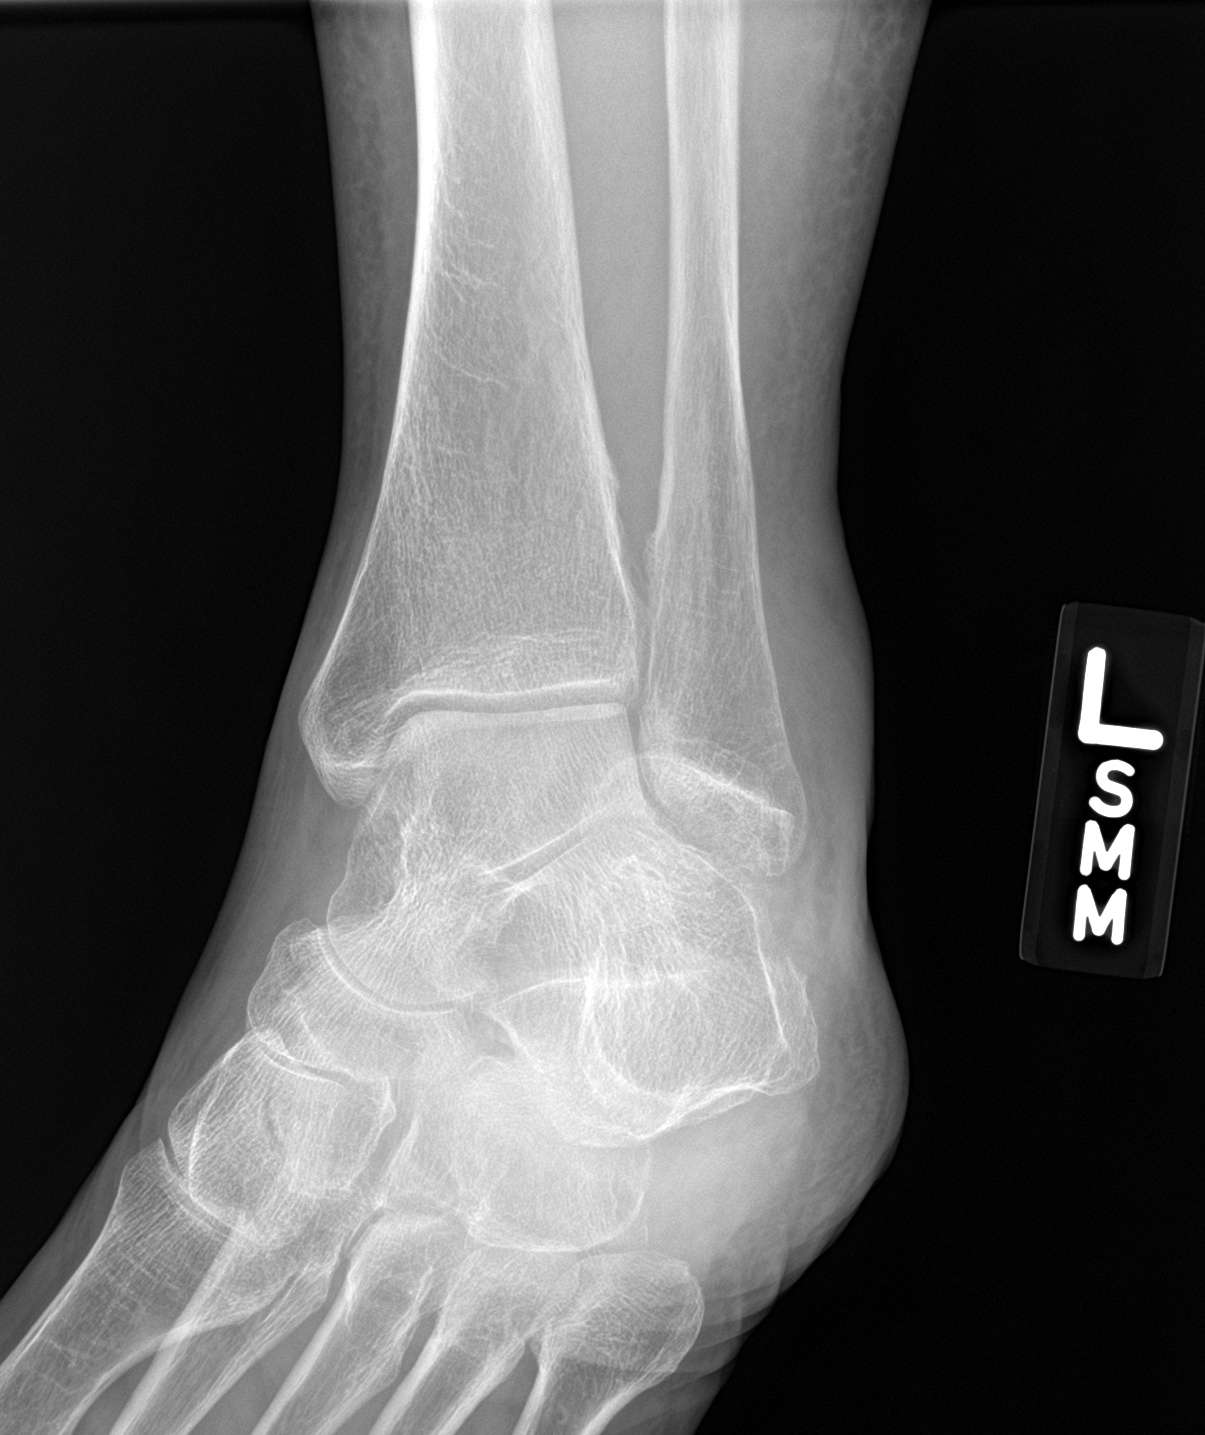

[ankle lat]
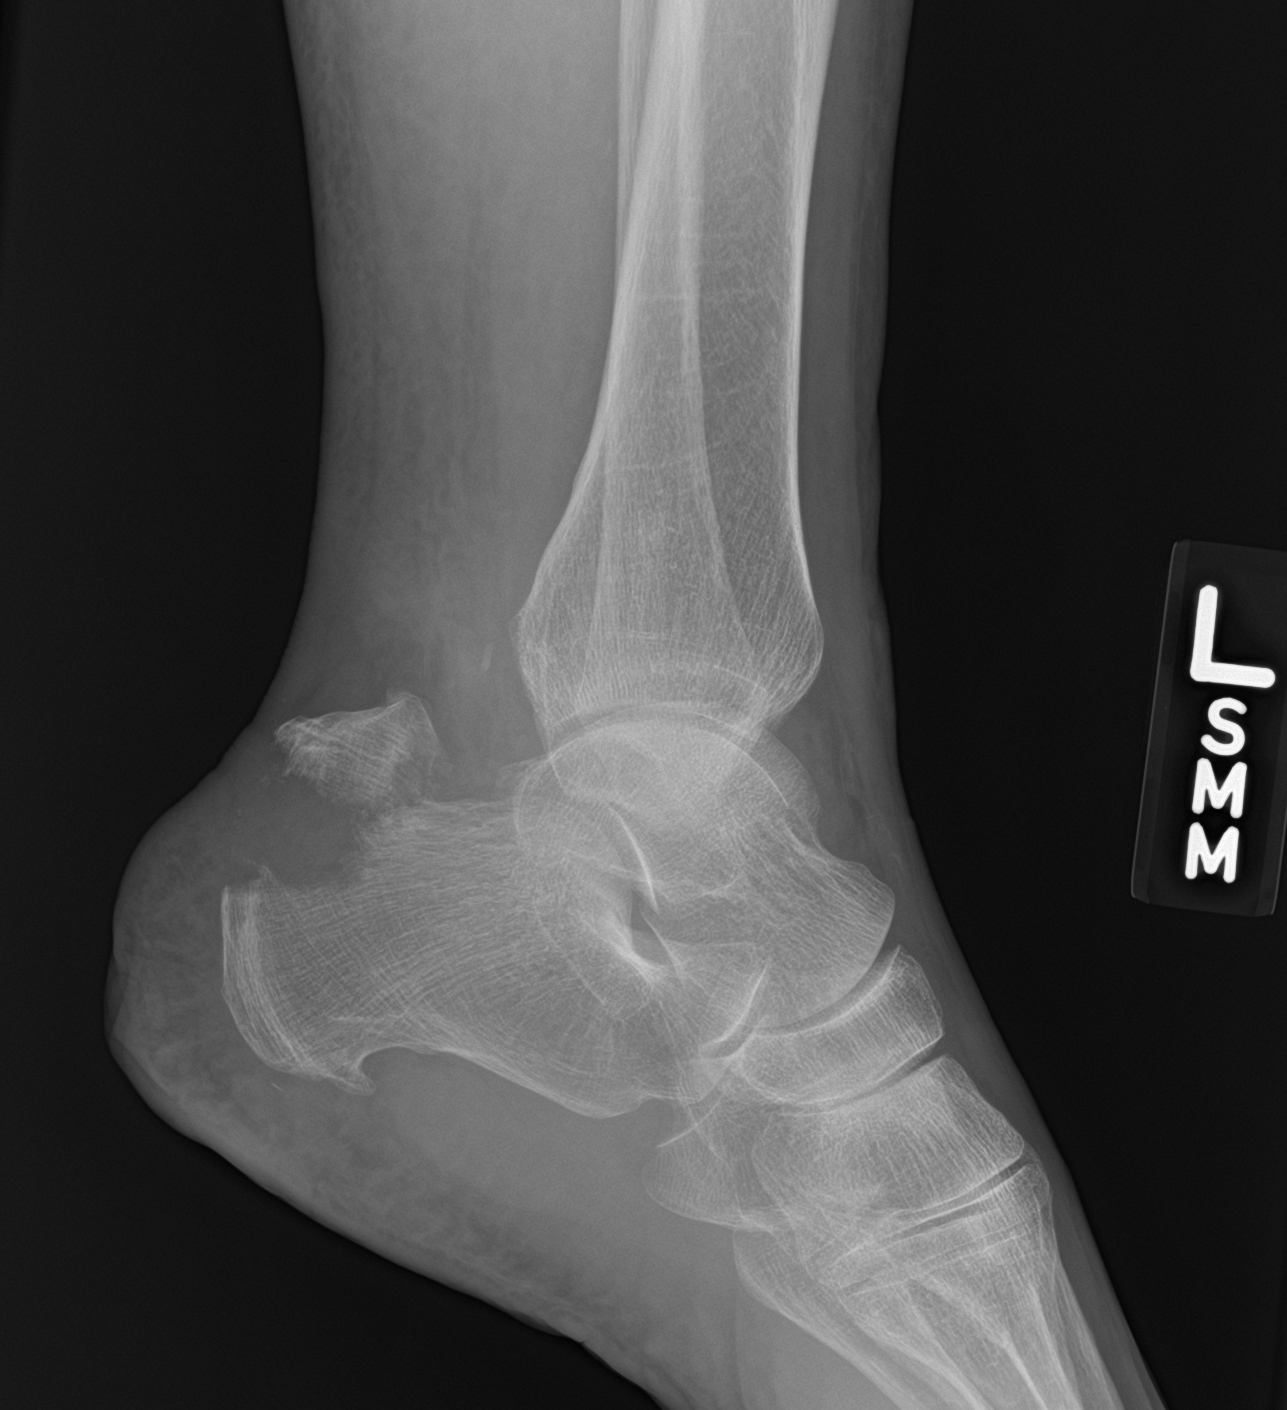

[3 of 3 positions shown; findings below may reference images not displayed]

FINDINGS: Soft tissue swelling diffusely.

Osseous demineralization.

Ankle mortise intact.

Large avulsion fracture of the superior posterior calcaneus
including the Achilles insertion with cranial retraction of the
fragment approximately 2 cm.

Small plantar calcaneal spur.

No additional fracture or dislocation.
IMPRESSION: Large avulsion fracture of of the superior aspect of the posterior
LEFT calcaneus with superior retraction of the fragment.

## 2019-03-16 ENCOUNTER — Other Ambulatory Visit (HOSPITAL_COMMUNITY): Payer: Self-pay | Admitting: Internal Medicine

## 2019-03-16 DIAGNOSIS — Z1231 Encounter for screening mammogram for malignant neoplasm of breast: Secondary | ICD-10-CM

## 2019-04-13 ENCOUNTER — Ambulatory Visit (HOSPITAL_COMMUNITY): Payer: PPO

## 2019-05-05 ENCOUNTER — Ambulatory Visit (HOSPITAL_COMMUNITY)
Admission: RE | Admit: 2019-05-05 | Discharge: 2019-05-05 | Disposition: A | Discharge status: Home / Self Care | Payer: PPO | Source: Ambulatory Visit | Attending: Internal Medicine | Admitting: Internal Medicine

## 2019-05-05 ENCOUNTER — Other Ambulatory Visit: Payer: Self-pay

## 2019-05-05 DIAGNOSIS — Z1231 Encounter for screening mammogram for malignant neoplasm of breast: Secondary | ICD-10-CM | POA: Diagnosis not present

## 2019-05-06 DIAGNOSIS — N183 Chronic kidney disease, stage 3 unspecified: Secondary | ICD-10-CM | POA: Diagnosis not present

## 2019-05-06 DIAGNOSIS — D751 Secondary polycythemia: Secondary | ICD-10-CM | POA: Diagnosis not present

## 2019-05-06 DIAGNOSIS — M810 Age-related osteoporosis without current pathological fracture: Secondary | ICD-10-CM | POA: Diagnosis not present

## 2019-05-13 ENCOUNTER — Other Ambulatory Visit (HOSPITAL_COMMUNITY): Payer: Self-pay | Admitting: Internal Medicine

## 2019-05-13 DIAGNOSIS — Z780 Asymptomatic menopausal state: Secondary | ICD-10-CM

## 2019-05-13 DIAGNOSIS — D751 Secondary polycythemia: Secondary | ICD-10-CM | POA: Diagnosis not present

## 2019-05-13 DIAGNOSIS — M810 Age-related osteoporosis without current pathological fracture: Secondary | ICD-10-CM | POA: Diagnosis not present

## 2019-05-13 DIAGNOSIS — N1831 Chronic kidney disease, stage 3a: Secondary | ICD-10-CM | POA: Diagnosis not present

## 2019-05-25 ENCOUNTER — Ambulatory Visit (HOSPITAL_COMMUNITY)
Admission: RE | Admit: 2019-05-25 | Discharge: 2019-05-25 | Disposition: A | Discharge status: Home / Self Care | Payer: PPO | Source: Ambulatory Visit | Attending: Internal Medicine | Admitting: Internal Medicine

## 2019-05-25 ENCOUNTER — Other Ambulatory Visit: Payer: Self-pay

## 2019-05-25 DIAGNOSIS — Z780 Asymptomatic menopausal state: Secondary | ICD-10-CM | POA: Diagnosis not present

## 2019-05-25 DIAGNOSIS — M810 Age-related osteoporosis without current pathological fracture: Secondary | ICD-10-CM | POA: Diagnosis not present

## 2019-06-25 ENCOUNTER — Ambulatory Visit
Admission: EM | Admit: 2019-06-25 | Discharge: 2019-06-25 | Disposition: A | Discharge status: Home / Self Care | Payer: PPO | Attending: Emergency Medicine | Admitting: Emergency Medicine

## 2019-06-25 ENCOUNTER — Other Ambulatory Visit: Payer: Self-pay

## 2019-06-25 DIAGNOSIS — Z20822 Contact with and (suspected) exposure to covid-19: Secondary | ICD-10-CM | POA: Diagnosis not present

## 2019-06-25 NOTE — Discharge Instructions (Addendum)

## 2019-06-25 NOTE — ED Provider Notes (Signed)
[facility]    CSN: [id number] Arrival date & time: 06/25/19  1453      History   Chief Complaint No chief complaint on file.   HPI Paige Glass is a 79 y.o. female.   who presents for COVID testing.  Reported positive Covid exposure.  Denies sick exposure to, flu or strep.  Denies recent travel.  Denies aggravating or alleviating symptoms.  Denies previous COVID infection.   Denies fever, chills, fatigue, nasal congestion, rhinorrhea, sore throat, cough, SOB, wheezing, chest pain, nausea, vomiting, changes in bowel or bladder habits.    The history is provided by the patient. No language interpreter was used.    Past Medical History:  Diagnosis Date  . Back pain   . Joint pain   . Joint swelling   . Nocturia   . Osteoporosis     Patient Active Problem List   Diagnosis Date Noted  . Achilles tendon contracture, left 09/16/2016  . Flexion contracture of joint of left lower leg 08/25/2016  . Avulsion fracture of calcaneus with routine healing, left 08/25/2016    Past Surgical History:  Procedure Laterality Date  . bilateral ear surgery    . OPEN REDUCTION INTERNAL FIXATION (ORIF) FOOT LISFRANC FRACTURE Left 07/14/2016   Procedure: OPEN REDUCTION INTERNAL FIXATION (ORIF) FOOT LISFRANC FRACTURE;  Surgeon: Meredith Pel, MD;  Location: [facility];  Service: Orthopedics;  Laterality: Left;  . TRIGGER FINGER RELEASE Right     OB History   No obstetric history on file.      Home Medications    Prior to Admission medications   Medication Sig Start Date End Date Taking? Authorizing Provider  alendronate (FOSAMAX) 70 MG tablet Take 70 mg by mouth every Tuesday. 04/08/16   [provider]  Biotin w/ Vitamins C & E (HAIR/SKIN/NAILS PO) Take 2 tablets by mouth daily.    [provider]  CALCIUM PO Take 2 tablets by mouth daily.    [provider]  methocarbamol (ROBAXIN) 500 MG tablet Take 1 tablet (500 mg total) by mouth every 8  (eight) hours as needed for muscle spasms. 08/04/16   Meredith Pel, MD  Multiple Vitamins-Minerals (MULTIVITAMIN ADULT PO) Take 1 tablet by mouth daily.    [provider]  oxyCODONE (OXY IR/ROXICODONE) 5 MG immediate release tablet Take 1 tablet (5 mg total) by mouth every 6 (six) hours as needed for severe pain. 08/04/16   Meredith Pel, MD    Family History Family History  Problem Relation Age of Onset  . Healthy Mother   . Healthy Father     Social History Social History   Tobacco Use  . Smoking status: Former Smoker    Packs/day: 0.50    Years: 25.00    Pack years: 12.50    Types: Cigarettes    Quit date: 08/11/2016    Years since quitting: 2.8  . Smokeless tobacco: Never Used  Substance Use Topics  . Alcohol use: No  . Drug use: No     Allergies   Patient has no known allergies.   Review of Systems Review of Systems  Constitutional: Negative.   HENT: Negative.   Respiratory: Negative.   Cardiovascular: Negative.   Gastrointestinal: Negative.   Neurological: Negative.   All other systems reviewed and are negative.    Physical Exam Triage Vital Signs ED Triage Vitals  Enc Vitals Group     BP      Pulse  Resp      Temp      Temp src      SpO2      Weight      Height      Head Circumference      Peak Flow      Pain Score      Pain Loc      Pain Edu?      Excl. in [place]?    No data found.  Updated Vital Signs There were no vitals taken for this visit.  Visual Acuity Right Eye Distance:   Left Eye Distance:   Bilateral Distance:    Right Eye Near:   Left Eye Near:    Bilateral Near:     Physical Exam Vitals and nursing note reviewed.  Constitutional:      General: She is not in acute distress.    Appearance: Normal appearance. She is normal weight. She is not ill-appearing or toxic-appearing.  HENT:     Head: Normocephalic.     Right Ear: Tympanic membrane, ear canal and external ear normal. There is no impacted  cerumen.     Left Ear: Tympanic membrane, ear canal and external ear normal. There is no impacted cerumen.     Nose: Nose normal. No congestion.     Mouth/Throat:     Mouth: Mucous membranes are moist.     Pharynx: Oropharynx is clear. No oropharyngeal exudate or posterior oropharyngeal erythema.  Cardiovascular:     Rate and Rhythm: Normal rate and regular rhythm.     Pulses: Normal pulses.     Heart sounds: Normal heart sounds. No murmur.  Pulmonary:     Effort: Pulmonary effort is normal. No respiratory distress.     Breath sounds: Normal breath sounds. No wheezing or rhonchi.  Chest:     Chest wall: No tenderness.  Abdominal:     General: Abdomen is flat. Bowel sounds are normal. There is no distension.     Palpations: There is no mass.     Tenderness: There is no abdominal tenderness.  Skin:    Capillary Refill: Capillary refill takes less than 2 seconds.  Neurological:     General: No focal deficit present.     Mental Status: She is alert and oriented to person, place, and time.      UC Treatments / Results  Labs (all labs ordered are listed, but only abnormal results are displayed) Labs Reviewed - No data to display  EKG   Radiology No results found.  Procedures Procedures (including critical care time)  Medications Ordered in UC Medications - No data to display  Initial Impression / Assessment and Plan / UC Course  I have reviewed the triage vital signs and the nursing notes.  Pertinent labs & imaging results that were available during my care of the patient were reviewed by me and considered in my medical decision making (see chart for details).     Patient stable at discharge. COVID-19 test was ordered Advised patient to quarantine To go to ED for worsening symptoms  Final Clinical Impressions(s) / UC Diagnoses   Final diagnoses:  Close exposure to COVID-19 virus     Discharge Instructions     COVID testing ordered.  It will take between 2-7  days for test results.  Someone will contact you regarding abnormal results.    In the meantime: You should remain isolated in your home for 10 days from symptom onset AND greater than 24 hours  after symptoms resolution (absence of fever without the use of fever-reducing medication and improvement in respiratory symptoms), whichever is longer Get plenty of rest and push fluids Use medications daily for symptom relief Use OTC medications like ibuprofen or tylenol as needed fever or pain Call or go to the ED if you have any new or worsening symptoms such as fever, worsening cough, shortness of breath, chest tightness, chest pain, turning blue, changes in mental status, etc...     ED Prescriptions    None     PDMP not reviewed this encounter.   Emerson Monte, FNP 06/25/19 1510

## 2019-06-25 NOTE — ED Triage Notes (Signed)
Pt presents to UC for covid test. Pt denies symptoms. Pt states husband had covid 4 weeks ago

## 2019-06-26 LAB — NOVEL CORONAVIRUS, NAA: SARS-CoV-2, NAA: NOT DETECTED

## 2019-08-10 DIAGNOSIS — M79642 Pain in left hand: Secondary | ICD-10-CM | POA: Diagnosis not present

## 2019-08-10 DIAGNOSIS — M79641 Pain in right hand: Secondary | ICD-10-CM | POA: Diagnosis not present

## 2019-08-10 DIAGNOSIS — M13842 Other specified arthritis, left hand: Secondary | ICD-10-CM | POA: Diagnosis not present

## 2019-09-22 DIAGNOSIS — Z716 Tobacco abuse counseling: Secondary | ICD-10-CM | POA: Diagnosis not present

## 2019-09-22 DIAGNOSIS — H259 Unspecified age-related cataract: Secondary | ICD-10-CM | POA: Diagnosis not present

## 2019-09-22 DIAGNOSIS — Z0189 Encounter for other specified special examinations: Secondary | ICD-10-CM | POA: Diagnosis not present

## 2019-09-22 DIAGNOSIS — M810 Age-related osteoporosis without current pathological fracture: Secondary | ICD-10-CM | POA: Diagnosis not present

## 2019-10-12 DIAGNOSIS — Z712 Person consulting for explanation of examination or test findings: Secondary | ICD-10-CM | POA: Diagnosis not present

## 2019-10-12 DIAGNOSIS — Z0000 Encounter for general adult medical examination without abnormal findings: Secondary | ICD-10-CM | POA: Diagnosis not present

## 2019-11-03 DIAGNOSIS — M810 Age-related osteoporosis without current pathological fracture: Secondary | ICD-10-CM | POA: Diagnosis not present

## 2020-03-16 DIAGNOSIS — Z23 Encounter for immunization: Secondary | ICD-10-CM | POA: Diagnosis not present

## 2020-04-02 DIAGNOSIS — H43813 Vitreous degeneration, bilateral: Secondary | ICD-10-CM | POA: Diagnosis not present

## 2020-04-23 ENCOUNTER — Other Ambulatory Visit (HOSPITAL_COMMUNITY): Payer: Self-pay | Admitting: Internal Medicine

## 2020-04-23 DIAGNOSIS — Z1231 Encounter for screening mammogram for malignant neoplasm of breast: Secondary | ICD-10-CM

## 2020-05-03 DIAGNOSIS — N183 Chronic kidney disease, stage 3 unspecified: Secondary | ICD-10-CM | POA: Diagnosis not present

## 2020-05-03 DIAGNOSIS — M810 Age-related osteoporosis without current pathological fracture: Secondary | ICD-10-CM | POA: Diagnosis not present

## 2020-05-03 DIAGNOSIS — D751 Secondary polycythemia: Secondary | ICD-10-CM | POA: Diagnosis not present

## 2020-05-03 DIAGNOSIS — M1990 Unspecified osteoarthritis, unspecified site: Secondary | ICD-10-CM | POA: Diagnosis not present

## 2020-05-09 ENCOUNTER — Ambulatory Visit (HOSPITAL_COMMUNITY): Payer: PPO

## 2020-05-10 DIAGNOSIS — M810 Age-related osteoporosis without current pathological fracture: Secondary | ICD-10-CM | POA: Diagnosis not present

## 2020-05-15 DIAGNOSIS — H259 Unspecified age-related cataract: Secondary | ICD-10-CM | POA: Diagnosis not present

## 2020-05-15 DIAGNOSIS — M810 Age-related osteoporosis without current pathological fracture: Secondary | ICD-10-CM | POA: Diagnosis not present

## 2020-05-15 DIAGNOSIS — Z23 Encounter for immunization: Secondary | ICD-10-CM | POA: Diagnosis not present

## 2020-05-15 DIAGNOSIS — Z0189 Encounter for other specified special examinations: Secondary | ICD-10-CM | POA: Diagnosis not present

## 2020-05-15 DIAGNOSIS — Z716 Tobacco abuse counseling: Secondary | ICD-10-CM | POA: Diagnosis not present

## 2020-05-15 DIAGNOSIS — Z712 Person consulting for explanation of examination or test findings: Secondary | ICD-10-CM | POA: Diagnosis not present

## 2020-06-07 DIAGNOSIS — F17210 Nicotine dependence, cigarettes, uncomplicated: Secondary | ICD-10-CM | POA: Diagnosis not present

## 2020-06-07 DIAGNOSIS — E782 Mixed hyperlipidemia: Secondary | ICD-10-CM | POA: Diagnosis not present

## 2020-06-07 DIAGNOSIS — L989 Disorder of the skin and subcutaneous tissue, unspecified: Secondary | ICD-10-CM | POA: Diagnosis not present

## 2020-06-07 DIAGNOSIS — B079 Viral wart, unspecified: Secondary | ICD-10-CM | POA: Diagnosis not present

## 2020-06-07 DIAGNOSIS — M810 Age-related osteoporosis without current pathological fracture: Secondary | ICD-10-CM | POA: Diagnosis not present

## 2020-06-07 DIAGNOSIS — Z716 Tobacco abuse counseling: Secondary | ICD-10-CM | POA: Diagnosis not present

## 2020-06-07 DIAGNOSIS — H259 Unspecified age-related cataract: Secondary | ICD-10-CM | POA: Diagnosis not present

## 2020-06-07 DIAGNOSIS — N1831 Chronic kidney disease, stage 3a: Secondary | ICD-10-CM | POA: Diagnosis not present

## 2020-06-07 DIAGNOSIS — Z0001 Encounter for general adult medical examination with abnormal findings: Secondary | ICD-10-CM | POA: Diagnosis not present

## 2020-06-13 ENCOUNTER — Ambulatory Visit (HOSPITAL_COMMUNITY): Payer: PPO

## 2020-07-02 ENCOUNTER — Other Ambulatory Visit: Payer: Self-pay

## 2020-07-02 ENCOUNTER — Ambulatory Visit (HOSPITAL_COMMUNITY)
Admission: RE | Admit: 2020-07-02 | Discharge: 2020-07-02 | Disposition: A | Discharge status: Home / Self Care | Payer: PPO | Source: Ambulatory Visit | Attending: Internal Medicine | Admitting: Internal Medicine

## 2020-07-02 DIAGNOSIS — Z1231 Encounter for screening mammogram for malignant neoplasm of breast: Secondary | ICD-10-CM | POA: Diagnosis not present

## 2020-07-12 DIAGNOSIS — H01004 Unspecified blepharitis left upper eyelid: Secondary | ICD-10-CM | POA: Diagnosis not present

## 2020-07-12 DIAGNOSIS — H01001 Unspecified blepharitis right upper eyelid: Secondary | ICD-10-CM | POA: Diagnosis not present

## 2020-07-12 DIAGNOSIS — H35341 Macular cyst, hole, or pseudohole, right eye: Secondary | ICD-10-CM | POA: Diagnosis not present

## 2020-07-12 DIAGNOSIS — H01002 Unspecified blepharitis right lower eyelid: Secondary | ICD-10-CM | POA: Diagnosis not present

## 2020-07-12 DIAGNOSIS — H25813 Combined forms of age-related cataract, bilateral: Secondary | ICD-10-CM | POA: Diagnosis not present

## 2020-08-13 DIAGNOSIS — H25811 Combined forms of age-related cataract, right eye: Secondary | ICD-10-CM | POA: Diagnosis not present

## 2020-08-14 NOTE — H&P (Signed)
Surgical History & Physical  Patient Name: Paige Glass DOB: [birth year]  Surgery: Cataract extraction with intraocular lens implant phacoemulsification; Right Eye  Surgeon: Baruch Goldmann MD Surgery Date:  08/20/2020 Pre-Op Date:  08/09/2020  HPI: A 61 Yr. old female patient The patient complains of headlights glare causing poor vision, which began 3-4 years ago. Both eyes are affected. OD> OS. The condition's severity is worsening. Distance vision is worse than near. Pt states headlights are blinding when driving at night and sunlight glare during the day. Symptoms are negatively affecting pt's quality of life. Pt does use any eye drops. Pt denies any increase in floaters or flashes of light. Pt is a MF CL wearer . Pt has not worn CL for 3-4 days.  Medical History: Cataracts h/o Macular Hole OD h/o Iritis OD PVD OU EBMD OU Arthritis  Review of Systems Negative Allergic/Immunologic Negative Cardiovascular Negative Constitutional Negative Ear, Nose, Mouth & Throat Negative Endocrine Negative Eyes Negative Gastrointestinal Negative Genitourinary Negative Hemotologic/Lymphatic Negative Integumentary Negative Musculoskeletal Negative Neurological Negative Psychiatry Negative Respiratory  Social   Never smoked   Medication Calcium, Multivitamin,   Sx/Procedures Macular Hole Repair,  Ear surgery, Tubal Ligation, Wrist Sx,   Drug Allergies   NKDA  History & Physical: Heent:  Cataract, Right eye NECK: supple without bruits LUNGS: lungs clear to auscultation CV: regular rate and rhythm Abdomen: soft and non-tender  Impression & Plan: Assessment: 1.  COMBINED FORMS AGE RELATED CATARACT; Both Eyes (H25.813) 2.  BLEPHARITIS; Right Upper Lid, Right Lower Lid, Left Upper Lid, Left Lower Lid (H01.001, H01.002,H01.004,H01.005) 3.  VITREOUS DETACHMENT PVD; Right Eye (H43.811) 4.  MACULAR HOLE OR PSEUDOHOLE; Right Eye (H35.341) 5.  CHORIORETINAL SCARS AFTER SURGERY FOR RD;  Right Eye (H59.811)  Plan: 1.  Cataract accounts for the patient's decreased vision. This visual impairment is not correctable with a tolerable change in glasses or contact lenses. Cataract surgery with an implantation of a new lens should significantly improve the visual and functional status of the patient. Discussed all risks, benefits, alternatives, and potential complications. Discussed the procedures and recovery. Patient desires to have surgery. A-scan ordered and performed today for intra-ocular lens calculations. The surgery will be performed in order to improve vision for driving, reading, and for eye examinations. Recommend phacoemulsification with intra-ocular lens. Recommend Dextenza for post-operative pain and inflammation. Right Eye worse - first. Dilates poorly - shugacaine by protocol. Malyugin Ring in room. Omidira. Discussed toric lens - declines. 2.  recommend regular lid cleaning. 3.  Old Asymptomatic. RD precautions given. Patient to call with increase in flashing lights/floaters/dark curtain. Symptomatic. 4.  Chronic. OCT today. 5.  Retina flat and attached.

## 2020-08-15 ENCOUNTER — Other Ambulatory Visit: Payer: Self-pay

## 2020-08-15 ENCOUNTER — Encounter (HOSPITAL_COMMUNITY)
Admission: RE | Admit: 2020-08-15 | Discharge: 2020-08-15 | Disposition: A | Discharge status: Home / Self Care | Payer: PPO | Source: Ambulatory Visit | Attending: Ophthalmology | Admitting: Ophthalmology

## 2020-08-17 ENCOUNTER — Other Ambulatory Visit (HOSPITAL_COMMUNITY)
Admission: RE | Admit: 2020-08-17 | Discharge: 2020-08-17 | Disposition: A | Discharge status: Home / Self Care | Payer: PPO | Source: Ambulatory Visit | Attending: Ophthalmology | Admitting: Ophthalmology

## 2020-08-17 ENCOUNTER — Other Ambulatory Visit: Payer: Self-pay

## 2020-08-17 DIAGNOSIS — Z01812 Encounter for preprocedural laboratory examination: Secondary | ICD-10-CM | POA: Diagnosis not present

## 2020-08-17 DIAGNOSIS — Z20822 Contact with and (suspected) exposure to covid-19: Secondary | ICD-10-CM | POA: Insufficient documentation

## 2020-08-18 LAB — SARS CORONAVIRUS 2 (TAT 6-24 HRS): SARS Coronavirus 2: NEGATIVE

## 2020-08-20 ENCOUNTER — Ambulatory Visit (HOSPITAL_COMMUNITY): Payer: PPO | Admitting: Anesthesiology

## 2020-08-20 ENCOUNTER — Encounter (HOSPITAL_COMMUNITY)
Admission: RE | Disposition: A | Discharge status: Home / Self Care | Payer: Self-pay | Source: Home / Self Care | Attending: Ophthalmology

## 2020-08-20 ENCOUNTER — Other Ambulatory Visit: Payer: Self-pay

## 2020-08-20 ENCOUNTER — Encounter (HOSPITAL_COMMUNITY): Payer: Self-pay | Admitting: Ophthalmology

## 2020-08-20 ENCOUNTER — Ambulatory Visit (HOSPITAL_COMMUNITY)
Admission: RE | Admit: 2020-08-20 | Discharge: 2020-08-20 | Disposition: A | Discharge status: Home / Self Care | Payer: PPO | Attending: Ophthalmology | Admitting: Ophthalmology

## 2020-08-20 DIAGNOSIS — H25811 Combined forms of age-related cataract, right eye: Secondary | ICD-10-CM | POA: Insufficient documentation

## 2020-08-20 DIAGNOSIS — Z87891 Personal history of nicotine dependence: Secondary | ICD-10-CM | POA: Diagnosis not present

## 2020-08-20 HISTORY — PX: CATARACT EXTRACTION W/PHACO: SHX586

## 2020-08-20 SURGERY — PHACOEMULSIFICATION, CATARACT, WITH IOL INSERTION
Anesthesia: Monitor Anesthesia Care | Site: Eye | Laterality: Right

## 2020-08-20 MED ORDER — LIDOCAINE HCL (PF) 1 % IJ SOLN
INTRAOCULAR | Status: DC | PRN
  Administered 2020-08-20: 1 mL via OPHTHALMIC

## 2020-08-20 MED ORDER — PHENYLEPHRINE-KETOROLAC 1-0.3 % IO SOLN
INTRAOCULAR | Status: AC
  Filled 2020-08-20: qty 4

## 2020-08-20 MED ORDER — STERILE WATER FOR IRRIGATION IR SOLN
Status: DC | PRN
  Administered 2020-08-20: 250 mL

## 2020-08-20 MED ORDER — SODIUM CHLORIDE 0.9% FLUSH
INTRAVENOUS | Status: DC | PRN
  Administered 2020-08-20: 10 mL via INTRAVENOUS

## 2020-08-20 MED ORDER — EPINEPHRINE PF 1 MG/ML IJ SOLN
INTRAMUSCULAR | Status: AC
  Filled 2020-08-20: qty 1

## 2020-08-20 MED ORDER — SODIUM HYALURONATE 23MG/ML IO SOSY
PREFILLED_SYRINGE | INTRAOCULAR | Status: DC | PRN
  Administered 2020-08-20: 0.6 mL via INTRAOCULAR

## 2020-08-20 MED ORDER — BSS IO SOLN
INTRAOCULAR | Status: DC | PRN
  Administered 2020-08-20: 15 mL

## 2020-08-20 MED ORDER — TETRACAINE HCL 0.5 % OP SOLN
1.0000 [drp] | OPHTHALMIC | Status: AC | PRN
  Administered 2020-08-20 (×3): 1 [drp] via OPHTHALMIC

## 2020-08-20 MED ORDER — SODIUM HYALURONATE 10 MG/ML IO SOLUTION
PREFILLED_SYRINGE | INTRAOCULAR | Status: DC | PRN
  Administered 2020-08-20: 0.85 mL via INTRAOCULAR

## 2020-08-20 MED ORDER — PHENYLEPHRINE-KETOROLAC 1-0.3 % IO SOLN
INTRAOCULAR | Status: DC | PRN
  Administered 2020-08-20: 500 mL via OPHTHALMIC

## 2020-08-20 MED ORDER — TROPICAMIDE 1 % OP SOLN
1.0000 [drp] | OPHTHALMIC | Status: AC
  Administered 2020-08-20 (×3): 1 [drp] via OPHTHALMIC

## 2020-08-20 MED ORDER — NEOMYCIN-POLYMYXIN-DEXAMETH 3.5-10000-0.1 OP SUSP
OPHTHALMIC | Status: DC | PRN
  Administered 2020-08-20: 1 [drp] via OPHTHALMIC

## 2020-08-20 MED ORDER — PHENYLEPHRINE HCL 2.5 % OP SOLN
1.0000 [drp] | OPHTHALMIC | Status: AC | PRN
  Administered 2020-08-20 (×3): 1 [drp] via OPHTHALMIC

## 2020-08-20 MED ORDER — LIDOCAINE HCL 3.5 % OP GEL
1.0000 "application " | Freq: Once | OPHTHALMIC | Status: AC
  Administered 2020-08-20: 1 via OPHTHALMIC

## 2020-08-20 MED ORDER — POVIDONE-IODINE 5 % OP SOLN
OPHTHALMIC | Status: DC | PRN
  Administered 2020-08-20: 1 via OPHTHALMIC

## 2020-08-20 MED ORDER — MIDAZOLAM HCL 5 MG/5ML IJ SOLN
INTRAMUSCULAR | Status: DC | PRN
  Administered 2020-08-20: 1 mg via INTRAVENOUS

## 2020-08-20 MED ORDER — MIDAZOLAM HCL 2 MG/2ML IJ SOLN
INTRAMUSCULAR | Status: AC
  Filled 2020-08-20: qty 2

## 2020-08-20 SURGICAL SUPPLY — 13 items
CLOTH BEACON ORANGE TIMEOUT ST (SAFETY) ×2 IMPLANT
EYE SHIELD UNIVERSAL CLEAR (GAUZE/BANDAGES/DRESSINGS) ×2 IMPLANT
GLOVE SURG UNDER POLY LF SZ6.5 (GLOVE) ×2 IMPLANT
GLOVE SURG UNDER POLY LF SZ7 (GLOVE) ×4 IMPLANT
GOWN STRL REUS W/TWL LRG LVL3 (GOWN DISPOSABLE) ×2 IMPLANT
NEEDLE HYPO 18GX1.5 BLUNT FILL (NEEDLE) ×2 IMPLANT
PAD ARMBOARD 7.5X6 YLW CONV (MISCELLANEOUS) ×2 IMPLANT
SYR TB 1ML LL NO SAFETY (SYRINGE) ×2 IMPLANT
TAPE SURG TRANSPORE 1 IN (GAUZE/BANDAGES/DRESSINGS) ×1 IMPLANT
TAPE SURGICAL TRANSPORE 1 IN (GAUZE/BANDAGES/DRESSINGS) ×2
TECNIS 1 PIECE IOL (Intraocular Lens) ×2 IMPLANT
VISCOELASTIC ADDITIONAL (OPHTHALMIC RELATED) IMPLANT
WATER STERILE IRR 250ML POUR (IV SOLUTION) ×2 IMPLANT

## 2020-08-20 NOTE — Interval H&P Note (Signed)
History and Physical Interval Note:  08/20/2020 8:55 AM  [name]  has presented today for surgery, with the diagnosis of Nuclear sclerotic cataract - Right eye.  The various methods of treatment have been discussed with the patient and family. After consideration of risks, benefits and other options for treatment, the patient has consented to  Procedure(s) with comments: CATARACT EXTRACTION PHACO AND INTRAOCULAR LENS PLACEMENT RIGHT EYE (Right) - right as a surgical intervention.  The patient's history has been reviewed, patient examined, no change in status, stable for surgery.  I have reviewed the patient's chart and labs.  Questions were answered to the patient's satisfaction.     Paige Glass

## 2020-08-20 NOTE — Transfer of Care (Signed)
Immediate Anesthesia Transfer of Care Note  Patient: Paige Glass  Procedure(s) Performed: CATARACT EXTRACTION PHACO AND INTRAOCULAR LENS PLACEMENT RIGHT EYE (Right Eye)  Patient Location: PACU  Anesthesia Type:MAC  Level of Consciousness: awake, alert , oriented and patient cooperative  Airway & Oxygen Therapy: Patient Spontanous Breathing  Post-op Assessment: Report given to RN, Post -op Vital signs reviewed and stable and Patient moving all extremities X 4  Post vital signs: Reviewed and stable  Last Vitals:  Vitals Value Taken Time  BP    Temp    Pulse    Resp    SpO2      Last Pain:  Vitals:   08/20/20 0748  TempSrc: Oral  PainSc: 0-No pain         Complications: No complications documented.

## 2020-08-20 NOTE — Discharge Instructions (Addendum)
Monitored Anesthesia Care, Care After This sheet gives you information about how to care for yourself after your procedure. Your health care provider may also give you more specific instructions. If you have problems or questions, contact your health care provider. What can I expect after the procedure? After the procedure, it is common to have:  Tiredness.  Forgetfulness about what happened after the procedure.  Impaired judgment for important decisions.  Nausea or vomiting.  Some difficulty with balance. Follow these instructions at home: For the time period you were told by your health care provider:  Rest as needed.  Do not participate in activities where you could fall or become injured.  Do not drive or use machinery.  Do not drink alcohol.  Do not take sleeping pills or medicines that cause drowsiness.  Do not make important decisions or sign legal documents.  Do not take care of children on your own.      Eating and drinking  Follow the diet that is recommended by your health care provider.  Drink enough fluid to keep your urine pale yellow.  If you vomit: ? Drink water, juice, or soup when you can drink without vomiting. ? Make sure you have little or no nausea before eating solid foods. General instructions  Have a responsible adult stay with you for the time you are told. It is important to have someone help care for you until you are awake and alert.  Take over-the-counter and prescription medicines only as told by your health care provider.  If you have sleep apnea, surgery and certain medicines can increase your risk for breathing problems. Follow instructions from your health care provider about wearing your sleep device: ? Anytime you are sleeping, including during daytime naps. ? While taking prescription pain medicines, sleeping medicines, or medicines that make you drowsy.  Avoid smoking.  Keep all follow-up visits as told by your health care  provider. This is important. Contact a health care provider if:  You keep feeling nauseous or you keep vomiting.  You feel light-headed.  You are still sleepy or having trouble with balance after 24 hours.  You develop a rash.  You have a fever.  You have redness or swelling around the IV site. Get help right away if:  You have trouble breathing.  You have new-onset confusion at home. Summary  For several hours after your procedure, you may feel tired. You may also be forgetful and have poor judgment.  Have a responsible adult stay with you for the time you are told. It is important to have someone help care for you until you are awake and alert.  Rest as told. Do not drive or operate machinery. Do not drink alcohol or take sleeping pills.  Get help right away if you have trouble breathing, or if you suddenly become confused. This information is not intended to replace advice given to you by your health care provider. Make sure you discuss any questions you have with your health care provider. Document Revised: 12/15/2019 Document Reviewed: 03/03/2019 Elsevier Patient Education  2021 Elsevier Inc.   Please discharge patient when stable, will follow up today with Dr. Wrzosek at the [facility] [place] office immediately following discharge.  Leave shield in place until visit.  All paperwork with discharge instructions will be given at the office.  [facility] Address:  [address]  , [place] [address]  

## 2020-08-20 NOTE — Op Note (Signed)
Date of procedure: 08/20/20  Pre-operative diagnosis:  Visually significant combined form age-related cataract, Right Eye (H25.811)  Post-operative diagnosis:  Visually significant combined form age-related cataract, Right Eye (H25.811)  Procedure: Removal of cataract via phacoemulsification and insertion of intra-ocular lens Wynetta Emery and [employer] DCB00  +22.0D into the capsular bag of the Right Eye  Attending surgeon: Gerda Diss. Mliss Wedin, MD, MA  Anesthesia: MAC, Topical Akten  Complications: None  Estimated Blood Loss: <23m (minimal)  Specimens: None  Implants: As above  Indications:  Visually significant age-related cataract, Right Eye  Procedure:  The patient was seen and identified in the pre-operative area. The operative eye was identified and dilated.  The operative eye was marked.  Topical anesthesia was administered to the operative eye.     The patient was then to the operative suite and placed in the supine position.  A timeout was performed confirming the patient, procedure to be performed, and all other relevant information.   The patient's face was prepped and draped in the usual fashion for intra-ocular surgery.  A lid speculum was placed into the operative eye and the surgical microscope moved into place and focused.  A superotemporal paracentesis was created using a 20 gauge paracentesis blade.  Shugarcaine was injected into the anterior chamber.  Viscoelastic was injected into the anterior chamber.  A temporal clear-corneal main wound incision was created using a 2.444mmicrokeratome.  A continuous curvilinear capsulorrhexis was initiated using an irrigating cystitome and completed using capsulorrhexis forceps.  Hydrodissection and hydrodeliniation were performed.  Viscoelastic was injected into the anterior chamber.  A phacoemulsification handpiece and a chopper as a second instrument were used to remove the nucleus and epinucleus. The irrigation/aspiration handpiece was  used to remove any remaining cortical material.   The capsular bag was reinflated with viscoelastic, checked, and found to be intact.  The intraocular lens was inserted into the capsular bag.  The irrigation/aspiration handpiece was used to remove any remaining viscoelastic.  The clear corneal wound and paracentesis wounds were then hydrated and checked with Weck-Cels to be watertight.  The lid-speculum was removed.  The drape was removed.  The patient's face was cleaned with a wet and dry 4x4.   Maxitrol was instilled in the eye. A clear shield was taped over the eye. The patient was taken to the post-operative care unit in good condition, having tolerated the procedure well.  Post-Op Instructions: [facility] and will receive all other orders and instructions.

## 2020-08-20 NOTE — Anesthesia Preprocedure Evaluation (Signed)
Anesthesia Evaluation  Patient identified by MRN, date of birth, ID band Patient awake    Reviewed: Allergy & Precautions, NPO status , Patient's Chart, lab work & pertinent test results  Airway Mallampati: II  TM Distance: >3 FB Neck ROM: Full    Dental  (+) Dental Advisory Given   Pulmonary former smoker,    Pulmonary exam normal breath sounds clear to auscultation       Cardiovascular Exercise Tolerance: Good Normal cardiovascular exam Rhythm:Regular Rate:Normal     Neuro/Psych negative neurological ROS  negative psych ROS   GI/Hepatic negative GI ROS, Neg liver ROS,   Endo/Other  negative endocrine ROS  Renal/GU negative Renal ROS     Musculoskeletal negative musculoskeletal ROS (+)   Abdominal   Peds  Hematology negative hematology ROS (+)   Anesthesia Other Findings   Reproductive/Obstetrics                            Anesthesia Physical Anesthesia Plan  ASA: II  Anesthesia Plan: MAC   Post-op Pain Management:    Induction: Intravenous  PONV Risk Score and Plan:   Airway Management Planned: Nasal Cannula and Natural Airway  Additional Equipment:   Intra-op Plan:   Post-operative Plan:   Informed Consent: I have reviewed the patients History and Physical, chart, labs and discussed the procedure including the risks, benefits and alternatives for the proposed anesthesia with the patient or authorized representative who has indicated his/her understanding and acceptance.     Dental advisory given  Plan Discussed with: CRNA and Surgeon  Anesthesia Plan Comments:         Anesthesia Quick Evaluation

## 2020-08-20 NOTE — Anesthesia Postprocedure Evaluation (Signed)
Anesthesia Post Note  Patient: Paige Glass  Procedure(s) Performed: CATARACT EXTRACTION PHACO AND INTRAOCULAR LENS PLACEMENT RIGHT EYE (Right Eye)  Patient location during evaluation: Phase II Anesthesia Type: MAC Level of consciousness: awake, awake and alert, oriented and patient cooperative Pain management: satisfactory to patient Vital Signs Assessment: post-procedure vital signs reviewed and stable Respiratory status: spontaneous breathing and nonlabored ventilation Cardiovascular status: stable Postop Assessment: no apparent nausea or vomiting Anesthetic complications: no   No complications documented.   Last Vitals:  Vitals:   08/20/20 0748  BP: 135/68  Pulse: 63  Resp: 16  Temp: 36.6 C  SpO2: 98%    Last Pain:  Vitals:   08/20/20 0748  TempSrc: Oral  PainSc: 0-No pain                 Ikran Patman

## 2020-08-21 ENCOUNTER — Encounter (HOSPITAL_COMMUNITY): Payer: Self-pay | Admitting: Ophthalmology

## 2020-08-27 DIAGNOSIS — H25812 Combined forms of age-related cataract, left eye: Secondary | ICD-10-CM | POA: Diagnosis not present

## 2020-08-28 NOTE — H&P (Signed)
Surgical History & Physical  Patient Name: Paige Glass DOB: 1940/11/26  Surgery: Cataract extraction with intraocular lens implant phacoemulsification; Left Eye  Surgeon: Baruch Goldmann MD Surgery Date:  09/03/2020 Pre-Op Date:  08/27/2020  HPI: [name] patient PO OD/Pre Op OS The patient is returning after cataract surgery. The right eye is affected. Status post cataract surgery, which began 1 week ago: Since the last visit, the affected area is doing well. The patient's vision is improved and stable. Patient is following medication instructions. Combo drops TID. Denies any increase in floaters/flashes of light. The patient complains of headlights glare causing poor vision, which began 3-4 years ago. The left eye is affected The condition's severity is worsening. Distance vision is worse than near. Pt states headlights are blinding when driving at night and sunlight glare during the day. Symptoms are negatively affecting pt's quality of life. HPI Completed by Dr. Baruch Goldmann  Medical History: Cataracts h/o Macular Hole OD h/o Iritis OD PVD OU EBMD OU Arthritis  Review of Systems Negative Allergic/Immunologic Negative Cardiovascular Negative Constitutional Negative Ear, Nose, Mouth & Throat Negative Endocrine Negative Eyes Negative Gastrointestinal Negative Genitourinary Negative Hemotologic/Lymphatic Negative Integumentary Negative Musculoskeletal Negative Neurological Negative Psychiatry Negative Respiratory  Social   Never smoked   Medication Prednisolone-gatiflox-bromfenac,  Calcium, Multivitamin,   Sx/Procedures Macular Hole Repair, Phaco c IOL OD,  Ear surgery, Tubal Ligation, Wrist Sx,   Drug Allergies   NKDA  History & Physical: Heent:  Cataract, Left eye NECK: supple without bruits LUNGS: lungs clear to auscultation CV: regular rate and rhythm Abdomen: soft and non-tender  Impression & Plan: Assessment: 1.  CATARACT EXTRACTION STATUS;  Right Eye (Z98.41) 2.  COMBINED FORMS AGE RELATED CATARACT; Left Eye (H25.812) 3.  NUCLEAR SCLEROSIS AGE RELATED; Left Eye (H25.12)  Plan: 1.  1 week after cataract surgery. Doing well with improved vision and normal eye pressure. Call with any problems or concerns. Continue Pred-Moxi-Brom 2x/day for 3 more weeks. 2.  Malyugin Ring in room. Discussed toric lens - declines. Cataract accounts for the patient's decreased vision. This visual impairment is not correctable with a tolerable change in glasses or contact lenses. Cataract surgery with an implantation of a new lens should significantly improve the visual and functional status of the patient. Discussed all risks, benefits, alternatives, and potential complications. Discussed the procedures and recovery. Patient desires to have surgery. A-scan ordered and performed today for intra-ocular lens calculations. The surgery will be performed in order to improve vision for driving, reading, and for eye examinations. Recommend phacoemulsification with intra-ocular lens. Recommend Dextenza for post-operative pain and inflammation. Left Eye. Surgery required to correct imbalance of vision. Dilates poorly - shugacaine by protocol. Omidira.

## 2020-08-29 ENCOUNTER — Encounter (HOSPITAL_COMMUNITY)
Admit: 2020-08-29 | Discharge: 2020-08-29 | Disposition: A | Discharge status: Home / Self Care | Payer: PPO | Attending: Ophthalmology | Admitting: Ophthalmology

## 2020-08-31 ENCOUNTER — Other Ambulatory Visit (HOSPITAL_COMMUNITY)
Admission: RE | Admit: 2020-08-31 | Discharge: 2020-08-31 | Disposition: A | Discharge status: Home / Self Care | Payer: PPO | Source: Ambulatory Visit | Attending: Ophthalmology | Admitting: Ophthalmology

## 2020-08-31 ENCOUNTER — Other Ambulatory Visit: Payer: Self-pay

## 2020-08-31 DIAGNOSIS — Z01812 Encounter for preprocedural laboratory examination: Secondary | ICD-10-CM | POA: Diagnosis not present

## 2020-08-31 DIAGNOSIS — Z20822 Contact with and (suspected) exposure to covid-19: Secondary | ICD-10-CM | POA: Diagnosis not present

## 2020-09-01 LAB — SARS CORONAVIRUS 2 (TAT 6-24 HRS): SARS Coronavirus 2: NEGATIVE

## 2020-09-03 ENCOUNTER — Encounter (HOSPITAL_COMMUNITY)
Admission: RE | Disposition: A | Discharge status: Home / Self Care | Payer: Self-pay | Source: Home / Self Care | Attending: Ophthalmology

## 2020-09-03 ENCOUNTER — Ambulatory Visit (HOSPITAL_COMMUNITY)
Admission: RE | Admit: 2020-09-03 | Discharge: 2020-09-03 | Disposition: A | Discharge status: Home / Self Care | Payer: PPO | Attending: Ophthalmology | Admitting: Ophthalmology

## 2020-09-03 ENCOUNTER — Ambulatory Visit (HOSPITAL_COMMUNITY): Payer: PPO | Admitting: Anesthesiology

## 2020-09-03 ENCOUNTER — Other Ambulatory Visit: Payer: Self-pay

## 2020-09-03 ENCOUNTER — Encounter (HOSPITAL_COMMUNITY): Payer: Self-pay | Admitting: Ophthalmology

## 2020-09-03 DIAGNOSIS — Z79899 Other long term (current) drug therapy: Secondary | ICD-10-CM | POA: Diagnosis not present

## 2020-09-03 DIAGNOSIS — Z87891 Personal history of nicotine dependence: Secondary | ICD-10-CM | POA: Diagnosis not present

## 2020-09-03 DIAGNOSIS — H25812 Combined forms of age-related cataract, left eye: Secondary | ICD-10-CM | POA: Insufficient documentation

## 2020-09-03 DIAGNOSIS — Z9841 Cataract extraction status, right eye: Secondary | ICD-10-CM | POA: Insufficient documentation

## 2020-09-03 HISTORY — PX: CATARACT EXTRACTION W/PHACO: SHX586

## 2020-09-03 SURGERY — PHACOEMULSIFICATION, CATARACT, WITH IOL INSERTION
Anesthesia: Monitor Anesthesia Care | Site: Eye | Laterality: Left

## 2020-09-03 MED ORDER — LIDOCAINE HCL (PF) 1 % IJ SOLN
INTRAOCULAR | Status: DC | PRN
  Administered 2020-09-03: 1 mL via OPHTHALMIC

## 2020-09-03 MED ORDER — POVIDONE-IODINE 5 % OP SOLN
OPHTHALMIC | Status: DC | PRN
  Administered 2020-09-03: 1 via OPHTHALMIC

## 2020-09-03 MED ORDER — PHENYLEPHRINE-KETOROLAC 1-0.3 % IO SOLN
INTRAOCULAR | Status: AC
  Filled 2020-09-03: qty 4

## 2020-09-03 MED ORDER — SODIUM HYALURONATE 10 MG/ML IO SOLUTION
PREFILLED_SYRINGE | INTRAOCULAR | Status: DC | PRN
  Administered 2020-09-03: 0.85 mL via INTRAOCULAR

## 2020-09-03 MED ORDER — SODIUM HYALURONATE 23MG/ML IO SOSY
PREFILLED_SYRINGE | INTRAOCULAR | Status: DC | PRN
  Administered 2020-09-03: 0.6 mL via INTRAOCULAR

## 2020-09-03 MED ORDER — STERILE WATER FOR IRRIGATION IR SOLN
Status: DC | PRN
  Administered 2020-09-03: 250 mL

## 2020-09-03 MED ORDER — NEOMYCIN-POLYMYXIN-DEXAMETH 3.5-10000-0.1 OP SUSP
OPHTHALMIC | Status: DC | PRN
  Administered 2020-09-03: 1 [drp] via OPHTHALMIC

## 2020-09-03 MED ORDER — EPINEPHRINE PF 1 MG/ML IJ SOLN
INTRAMUSCULAR | Status: AC
  Filled 2020-09-03: qty 1

## 2020-09-03 MED ORDER — LIDOCAINE HCL 3.5 % OP GEL
1.0000 "application " | Freq: Once | OPHTHALMIC | Status: AC
  Administered 2020-09-03: 1 via OPHTHALMIC

## 2020-09-03 MED ORDER — PHENYLEPHRINE HCL 2.5 % OP SOLN
1.0000 [drp] | OPHTHALMIC | Status: AC | PRN
  Administered 2020-09-03 (×3): 1 [drp] via OPHTHALMIC

## 2020-09-03 MED ORDER — TROPICAMIDE 1 % OP SOLN
1.0000 [drp] | OPHTHALMIC | Status: AC
  Administered 2020-09-03 (×3): 1 [drp] via OPHTHALMIC

## 2020-09-03 MED ORDER — TETRACAINE HCL 0.5 % OP SOLN
1.0000 [drp] | OPHTHALMIC | Status: AC | PRN
  Administered 2020-09-03 (×3): 1 [drp] via OPHTHALMIC

## 2020-09-03 MED ORDER — PHENYLEPHRINE-KETOROLAC 1-0.3 % IO SOLN
INTRAOCULAR | Status: DC | PRN
  Administered 2020-09-03: 500 mL via OPHTHALMIC

## 2020-09-03 MED ORDER — BSS IO SOLN
INTRAOCULAR | Status: DC | PRN
  Administered 2020-09-03: 15 mL via INTRAOCULAR

## 2020-09-03 SURGICAL SUPPLY — 13 items
CLOTH BEACON ORANGE TIMEOUT ST (SAFETY) ×2 IMPLANT
EYE SHIELD UNIVERSAL CLEAR (GAUZE/BANDAGES/DRESSINGS) ×2 IMPLANT
GLOVE SURG UNDER POLY LF SZ6.5 (GLOVE) ×2 IMPLANT
GLOVE SURG UNDER POLY LF SZ7 (GLOVE) ×2 IMPLANT
NEEDLE HYPO 18GX1.5 BLUNT FILL (NEEDLE) ×2 IMPLANT
PAD ARMBOARD 7.5X6 YLW CONV (MISCELLANEOUS) ×2 IMPLANT
RING MALYGIN (MISCELLANEOUS) IMPLANT
RING MALYGIN 7.0 (MISCELLANEOUS) IMPLANT
SYR TB 1ML LL NO SAFETY (SYRINGE) ×2 IMPLANT
TAPE SURG TRANSPORE 1 IN (GAUZE/BANDAGES/DRESSINGS) ×1 IMPLANT
TAPE SURGICAL TRANSPORE 1 IN (GAUZE/BANDAGES/DRESSINGS) ×2
TECHNIS 1-PIECE IOL (Intraocular Lens) ×2 IMPLANT
WATER STERILE IRR 250ML POUR (IV SOLUTION) ×2 IMPLANT

## 2020-09-03 NOTE — Anesthesia Preprocedure Evaluation (Signed)
Anesthesia Evaluation  Patient identified by MRN, date of birth, ID band Patient awake    Reviewed: Allergy & Precautions, NPO status , Patient's Chart, lab work & pertinent test results  Airway Mallampati: II  TM Distance: >3 FB Neck ROM: Full    Dental  (+) Dental Advisory Given, Partial Lower   Pulmonary Patient abstained from smoking., former smoker,    Pulmonary exam normal breath sounds clear to auscultation       Cardiovascular Exercise Tolerance: Good Normal cardiovascular exam Rhythm:Regular Rate:Normal     Neuro/Psych negative neurological ROS  negative psych ROS   GI/Hepatic negative GI ROS, Neg liver ROS,   Endo/Other  negative endocrine ROS  Renal/GU negative Renal ROS     Musculoskeletal negative musculoskeletal ROS (+)   Abdominal   Peds  Hematology negative hematology ROS (+)   Anesthesia Other Findings   Reproductive/Obstetrics                             Anesthesia Physical  Anesthesia Plan  ASA: II  Anesthesia Plan: MAC   Post-op Pain Management:    Induction: Intravenous  PONV Risk Score and Plan:   Airway Management Planned: Nasal Cannula and Natural Airway  Additional Equipment:   Intra-op Plan:   Post-operative Plan:   Informed Consent: I have reviewed the patients History and Physical, chart, labs and discussed the procedure including the risks, benefits and alternatives for the proposed anesthesia with the patient or authorized representative who has indicated his/her understanding and acceptance.     Dental advisory given  Plan Discussed with: CRNA and Surgeon  Anesthesia Plan Comments:         Anesthesia Quick Evaluation

## 2020-09-03 NOTE — Discharge Instructions (Signed)
Please discharge patient when stable, will follow up today with Dr. Quinlan Vollmer at the [facility] [place] office immediately following discharge.  Leave shield in place until visit.  All paperwork with discharge instructions will be given at the office.  [facility] Address:  [address]  , [place] [address]  

## 2020-09-03 NOTE — Anesthesia Postprocedure Evaluation (Signed)
Anesthesia Post Note  Patient: Sue Fernicola Cocking  Procedure(s) Performed: CATARACT EXTRACTION PHACO AND INTRAOCULAR LENS PLACEMENT (IOC) (Left Eye)  Patient location during evaluation: Phase II Anesthesia Type: MAC Level of consciousness: awake and alert and oriented Pain management: pain level controlled Vital Signs Assessment: post-procedure vital signs reviewed and stable Respiratory status: spontaneous breathing and respiratory function stable Cardiovascular status: blood pressure returned to baseline and stable Postop Assessment: no apparent nausea or vomiting Anesthetic complications: no   No complications documented.   Last Vitals:  Vitals:   09/03/20 0857 09/03/20 1017  BP: 118/62 127/65  Pulse: 65 63  Resp: 17 18  Temp: 36.5 C 36.4 C  SpO2: 98% 97%    Last Pain:  Vitals:   09/03/20 1017  TempSrc: Oral  PainSc: 0-No pain                 Keldan Eplin C Denine Brotz

## 2020-09-03 NOTE — Transfer of Care (Signed)
Immediate Anesthesia Transfer of Care Note  Patient: Paige Glass  Procedure(s) Performed: CATARACT EXTRACTION PHACO AND INTRAOCULAR LENS PLACEMENT (IOC) (Left Eye)  Patient Location: PACU  Anesthesia Type:MAC  Level of Consciousness: awake, alert  and oriented  Airway & Oxygen Therapy: Patient Spontanous Breathing  Post-op Assessment: Report given to RN and Post -op Vital signs reviewed and stable  Post vital signs: Reviewed and stable  Last Vitals:  Vitals Value Taken Time  BP    Temp    Pulse    Resp    SpO2      Last Pain:  Vitals:   09/03/20 0857  TempSrc: Oral  PainSc: 0-No pain         Complications: No complications documented.

## 2020-09-03 NOTE — Op Note (Signed)
Date of procedure: 09/03/20  Pre-operative diagnosis: Visually significant age-related combined cataract, Left Eye (H25.812)  Post-operative diagnosis: Visually significant age-related combined cataract, Left Eye (H25.812)  Procedure: Removal of cataract via phacoemulsification and insertion of intra-ocular lens Wynetta Emery and [place]  +21.5D into the capsular bag of the Left Eye  Attending surgeon: Gerda Diss. Madison Direnzo, MD, MA  Anesthesia: MAC, Topical Akten  Complications: None  Estimated Blood Loss: <17m (minimal)  Specimens: None  Implants: As above  Indications:  Visually significant age-related cataract, Left Eye  Procedure:  The patient was seen and identified in the pre-operative area. The operative eye was identified and dilated.  The operative eye was marked.  Topical anesthesia was administered to the operative eye.     The patient was then to the operative suite and placed in the supine position.  A timeout was performed confirming the patient, procedure to be performed, and all other relevant information.   The patient's face was prepped and draped in the usual fashion for intra-ocular surgery.  A lid speculum was placed into the operative eye and the surgical microscope moved into place and focused.  An inferotemporal paracentesis was created using a 20 gauge paracentesis blade.  Shugarcaine was injected into the anterior chamber.  Viscoelastic was injected into the anterior chamber.  A temporal clear-corneal main wound incision was created using a 2.455mmicrokeratome.  A continuous curvilinear capsulorrhexis was initiated using an irrigating cystitome and completed using capsulorrhexis forceps.  Hydrodissection and hydrodeliniation were performed.  Viscoelastic was injected into the anterior chamber.  A phacoemulsification handpiece and a chopper as a second instrument were used to remove the nucleus and epinucleus. The irrigation/aspiration handpiece was used to remove  any remaining cortical material.   The capsular bag was reinflated with viscoelastic, checked, and found to be intact.  The intraocular lens was inserted into the capsular bag.  The irrigation/aspiration handpiece was used to remove any remaining viscoelastic.  The clear corneal wound and paracentesis wounds were then hydrated and checked with Weck-Cels to be watertight.  The lid-speculum was removed.  The drape was removed.  The patient's face was cleaned with a wet and dry 4x4.   Maxitrol was instilled in the eye. A clear shield was taped over the eye. The patient was taken to the post-operative care unit in good condition, having tolerated the procedure well.  Post-Op Instructions: [facility] and will receive all other orders and instructions.

## 2020-09-03 NOTE — Interval H&P Note (Signed)
History and Physical Interval Note:  09/03/2020 9:50 AM  Paige Glass  has presented today for surgery, with the diagnosis of Nuclear sclerotic cataract - Left eye.  The various methods of treatment have been discussed with the patient and family. After consideration of risks, benefits and other options for treatment, the patient has consented to  Procedure(s) with comments: CATARACT EXTRACTION PHACO AND INTRAOCULAR LENS PLACEMENT ([place]) (Left) - left as a surgical intervention.  The patient's history has been reviewed, patient examined, no change in status, stable for surgery.  I have reviewed the patient's chart and labs.  Questions were answered to the patient's satisfaction.     Baruch Goldmann

## 2020-09-04 ENCOUNTER — Encounter (HOSPITAL_COMMUNITY): Payer: Self-pay | Admitting: Ophthalmology

## 2020-11-09 DIAGNOSIS — M810 Age-related osteoporosis without current pathological fracture: Secondary | ICD-10-CM | POA: Diagnosis not present

## 2021-01-03 DIAGNOSIS — Z0001 Encounter for general adult medical examination with abnormal findings: Secondary | ICD-10-CM | POA: Diagnosis not present

## 2021-01-11 DIAGNOSIS — E1165 Type 2 diabetes mellitus with hyperglycemia: Secondary | ICD-10-CM | POA: Diagnosis not present

## 2021-01-11 DIAGNOSIS — I10 Essential (primary) hypertension: Secondary | ICD-10-CM | POA: Diagnosis not present

## 2021-02-28 DIAGNOSIS — R42 Dizziness and giddiness: Secondary | ICD-10-CM | POA: Diagnosis not present

## 2021-02-28 DIAGNOSIS — R0981 Nasal congestion: Secondary | ICD-10-CM | POA: Diagnosis not present

## 2021-02-28 DIAGNOSIS — J0100 Acute maxillary sinusitis, unspecified: Secondary | ICD-10-CM | POA: Diagnosis not present

## 2021-02-28 DIAGNOSIS — R509 Fever, unspecified: Secondary | ICD-10-CM | POA: Diagnosis not present

## 2021-03-09 IMAGING — MG DIGITAL SCREENING BILAT W/ TOMO W/ CAD
6 of 10 series · 6 of 30 positions shown · non-contrast
Comparison: Previous exam(s).

CLINICAL DATA: Screening.

EXAM:
DIGITAL SCREENING BILATERAL MAMMOGRAM WITH TOMO AND CAD

[L MLO synth-2D (1 of 2)]
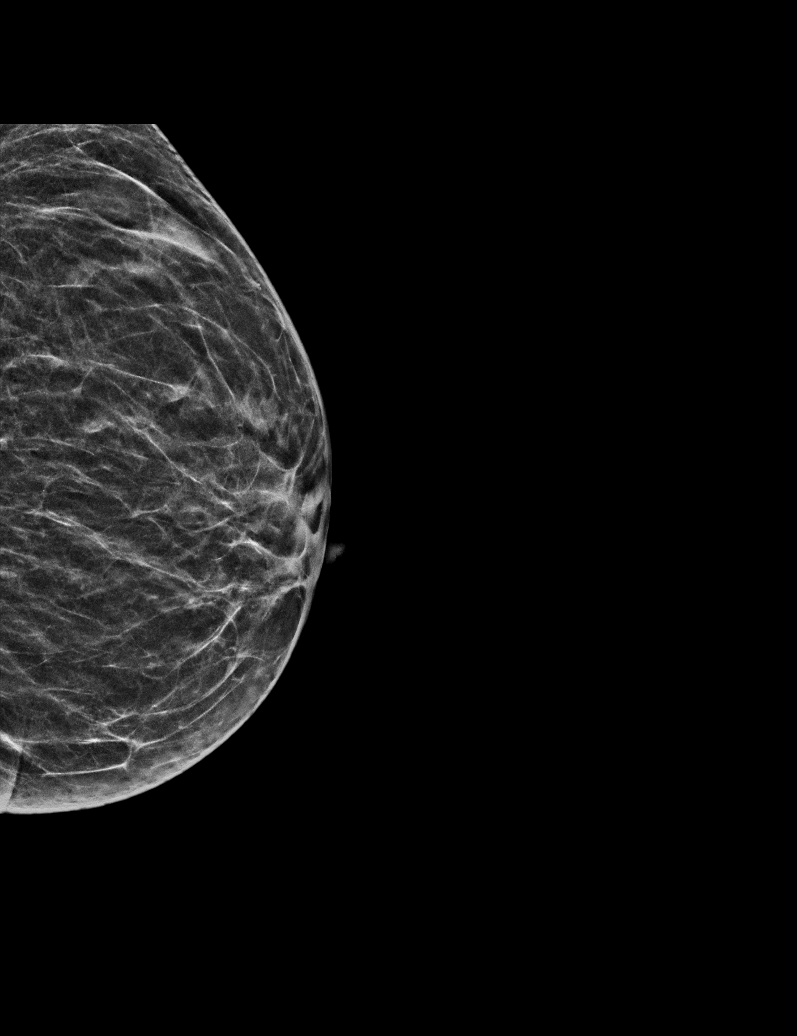

[L MLO synth-2D (2 of 2)]
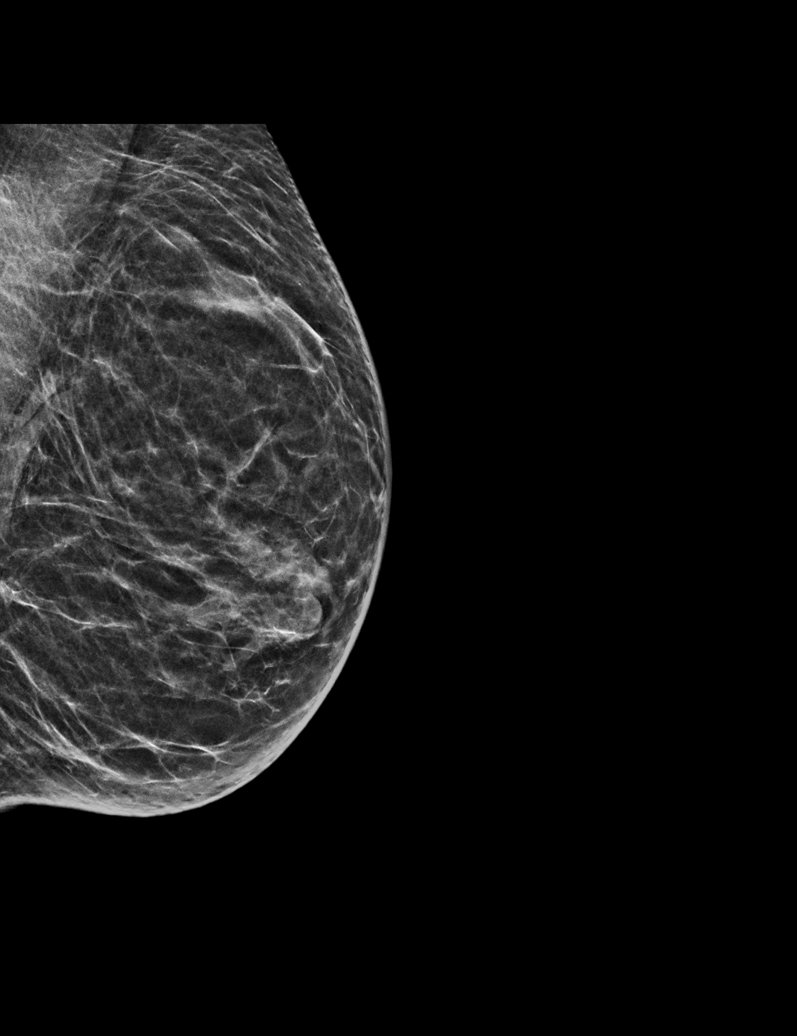

[L CC synth-2D]
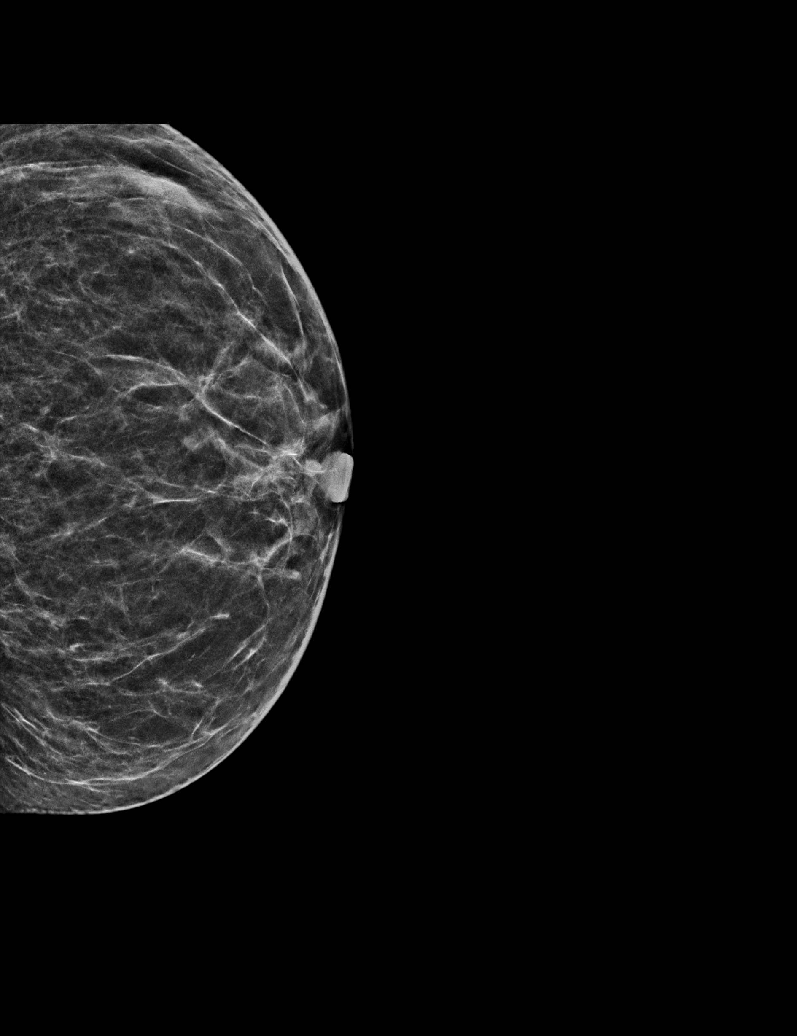

[R MLO synth-2D]
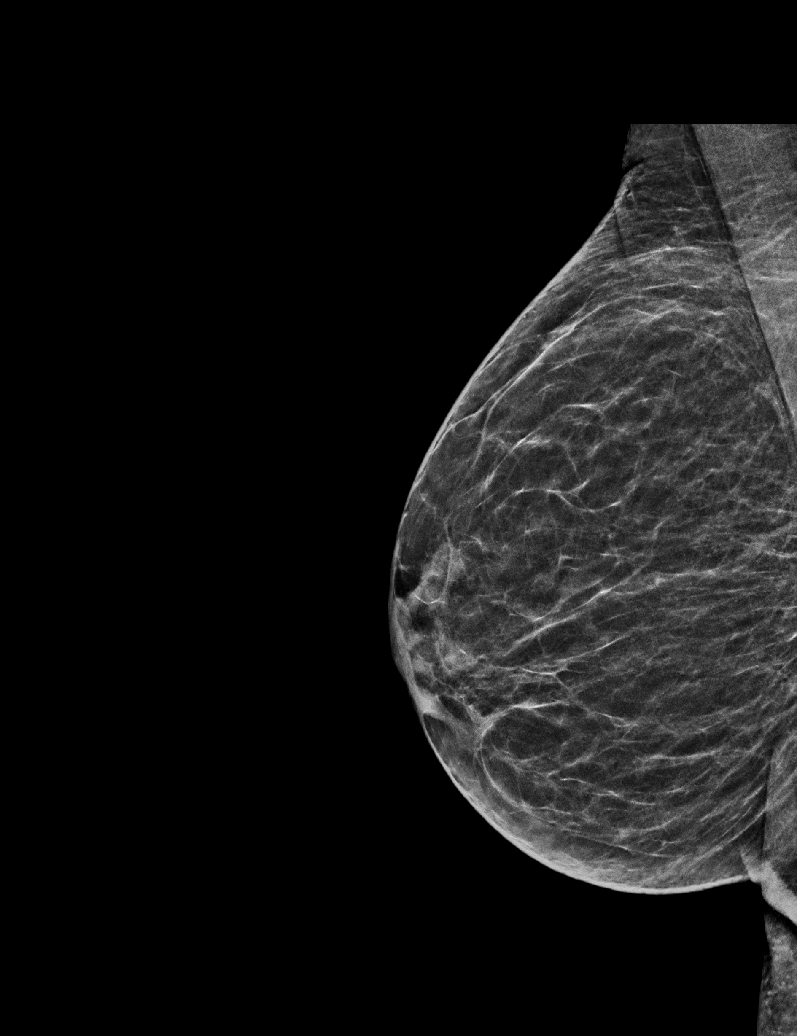

[R CC synth-2D]
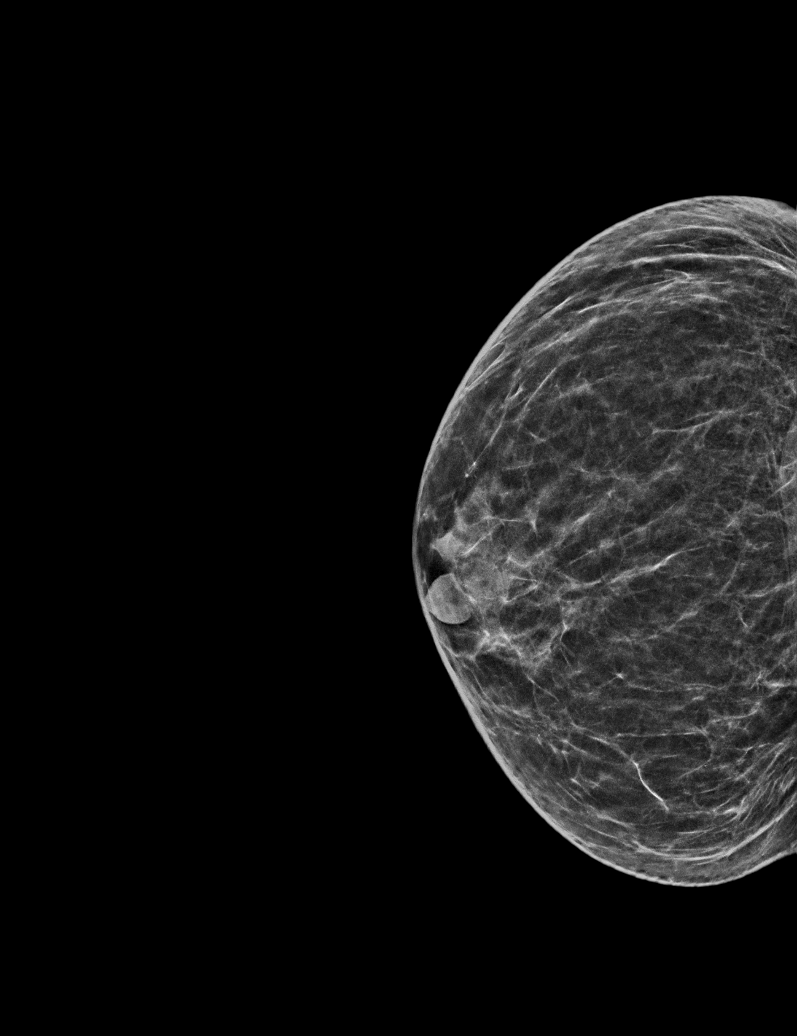

[R MLO tomo · tomo slice 22/43.0]
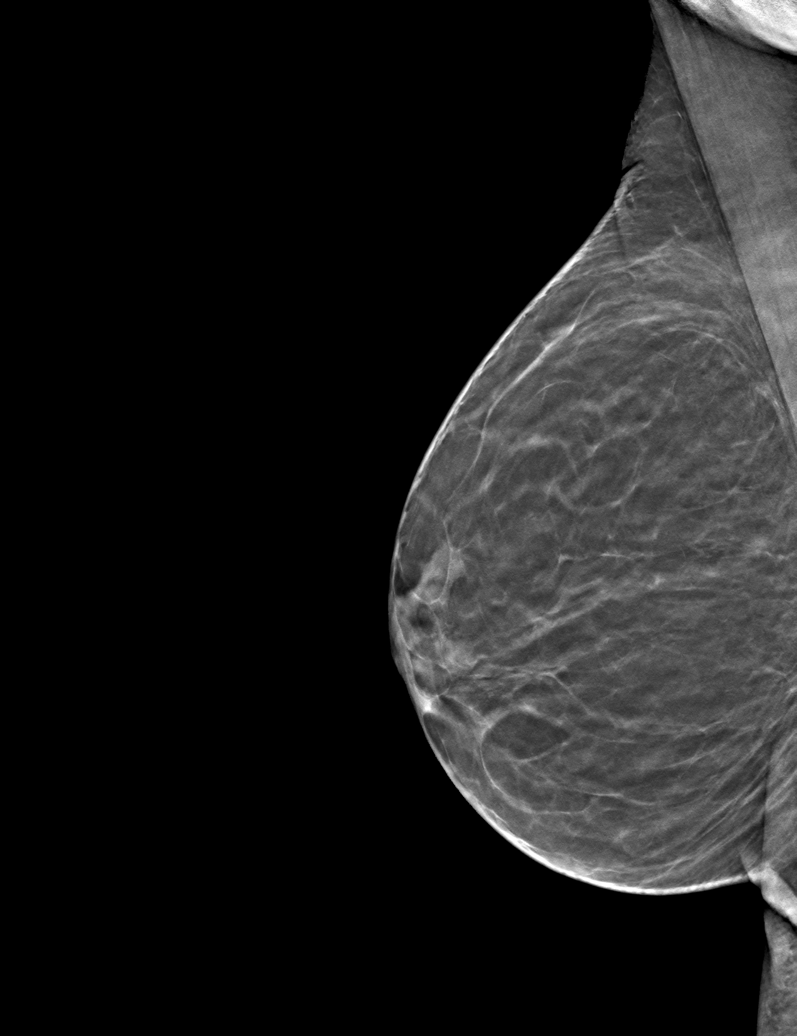

[6 of 30 positions shown; findings below may reference images not displayed]

ACR Breast Density Category b: There are scattered areas of
fibroglandular density.
FINDINGS: There are no findings suspicious for malignancy. Images were
processed with CAD.
IMPRESSION: No mammographic evidence of malignancy. A result letter of this
screening mammogram will be mailed directly to the patient.

RECOMMENDATION:
Screening mammogram in one year. (Code:[id number])

BI-RADS CATEGORY  1: Negative.

## 2021-03-26 ENCOUNTER — Other Ambulatory Visit (HOSPITAL_COMMUNITY): Payer: Self-pay | Admitting: Internal Medicine

## 2021-03-26 DIAGNOSIS — Z1231 Encounter for screening mammogram for malignant neoplasm of breast: Secondary | ICD-10-CM

## 2021-06-10 DIAGNOSIS — R252 Cramp and spasm: Secondary | ICD-10-CM | POA: Diagnosis not present

## 2021-06-10 DIAGNOSIS — E782 Mixed hyperlipidemia: Secondary | ICD-10-CM | POA: Diagnosis not present

## 2021-06-10 DIAGNOSIS — B078 Other viral warts: Secondary | ICD-10-CM | POA: Diagnosis not present

## 2021-06-10 DIAGNOSIS — Z0001 Encounter for general adult medical examination with abnormal findings: Secondary | ICD-10-CM | POA: Diagnosis not present

## 2021-06-10 DIAGNOSIS — F17200 Nicotine dependence, unspecified, uncomplicated: Secondary | ICD-10-CM | POA: Diagnosis not present

## 2021-06-10 DIAGNOSIS — L989 Disorder of the skin and subcutaneous tissue, unspecified: Secondary | ICD-10-CM | POA: Diagnosis not present

## 2021-06-10 DIAGNOSIS — M810 Age-related osteoporosis without current pathological fracture: Secondary | ICD-10-CM | POA: Diagnosis not present

## 2021-06-10 DIAGNOSIS — N1831 Chronic kidney disease, stage 3a: Secondary | ICD-10-CM | POA: Diagnosis not present

## 2021-06-10 DIAGNOSIS — H259 Unspecified age-related cataract: Secondary | ICD-10-CM | POA: Diagnosis not present

## 2021-06-13 DIAGNOSIS — M810 Age-related osteoporosis without current pathological fracture: Secondary | ICD-10-CM | POA: Diagnosis not present

## 2021-07-04 ENCOUNTER — Ambulatory Visit (HOSPITAL_COMMUNITY): Payer: PPO

## 2021-07-08 ENCOUNTER — Ambulatory Visit (HOSPITAL_COMMUNITY): Payer: PPO

## 2021-07-08 DIAGNOSIS — R252 Cramp and spasm: Secondary | ICD-10-CM | POA: Diagnosis not present

## 2021-07-08 DIAGNOSIS — E782 Mixed hyperlipidemia: Secondary | ICD-10-CM | POA: Diagnosis not present

## 2021-07-08 DIAGNOSIS — J302 Other seasonal allergic rhinitis: Secondary | ICD-10-CM | POA: Diagnosis not present

## 2021-07-08 DIAGNOSIS — N1831 Chronic kidney disease, stage 3a: Secondary | ICD-10-CM | POA: Diagnosis not present

## 2021-07-08 DIAGNOSIS — M810 Age-related osteoporosis without current pathological fracture: Secondary | ICD-10-CM | POA: Diagnosis not present

## 2021-07-08 DIAGNOSIS — B078 Other viral warts: Secondary | ICD-10-CM | POA: Diagnosis not present

## 2021-07-08 DIAGNOSIS — F17200 Nicotine dependence, unspecified, uncomplicated: Secondary | ICD-10-CM | POA: Diagnosis not present

## 2021-07-08 DIAGNOSIS — H259 Unspecified age-related cataract: Secondary | ICD-10-CM | POA: Diagnosis not present

## 2021-07-15 ENCOUNTER — Ambulatory Visit (HOSPITAL_COMMUNITY): Payer: PPO

## 2021-07-16 ENCOUNTER — Other Ambulatory Visit (HOSPITAL_COMMUNITY): Payer: Self-pay | Admitting: Family Medicine

## 2021-07-16 DIAGNOSIS — R0981 Nasal congestion: Secondary | ICD-10-CM | POA: Diagnosis not present

## 2021-07-16 DIAGNOSIS — J019 Acute sinusitis, unspecified: Secondary | ICD-10-CM | POA: Diagnosis not present

## 2021-07-16 DIAGNOSIS — M810 Age-related osteoporosis without current pathological fracture: Secondary | ICD-10-CM

## 2021-07-16 DIAGNOSIS — R059 Cough, unspecified: Secondary | ICD-10-CM | POA: Diagnosis not present

## 2021-07-25 ENCOUNTER — Ambulatory Visit (HOSPITAL_COMMUNITY)
Admission: RE | Admit: 2021-07-25 | Discharge: 2021-07-25 | Disposition: A | Discharge status: Home / Self Care | Payer: PPO | Source: Ambulatory Visit | Attending: Internal Medicine | Admitting: Internal Medicine

## 2021-07-25 ENCOUNTER — Ambulatory Visit (HOSPITAL_COMMUNITY)
Admission: RE | Admit: 2021-07-25 | Discharge: 2021-07-25 | Disposition: A | Discharge status: Home / Self Care | Payer: PPO | Source: Ambulatory Visit | Attending: Family Medicine | Admitting: Family Medicine

## 2021-07-25 DIAGNOSIS — M810 Age-related osteoporosis without current pathological fracture: Secondary | ICD-10-CM | POA: Diagnosis not present

## 2021-07-25 DIAGNOSIS — Z1231 Encounter for screening mammogram for malignant neoplasm of breast: Secondary | ICD-10-CM | POA: Diagnosis not present

## 2021-07-29 ENCOUNTER — Other Ambulatory Visit (HOSPITAL_COMMUNITY): Payer: Self-pay | Admitting: Internal Medicine

## 2021-07-29 DIAGNOSIS — R928 Other abnormal and inconclusive findings on diagnostic imaging of breast: Secondary | ICD-10-CM

## 2021-07-31 ENCOUNTER — Ambulatory Visit (HOSPITAL_COMMUNITY)
Admission: RE | Admit: 2021-07-31 | Discharge: 2021-07-31 | Disposition: A | Discharge status: Home / Self Care | Payer: PPO | Source: Ambulatory Visit | Attending: Internal Medicine | Admitting: Internal Medicine

## 2021-07-31 DIAGNOSIS — R928 Other abnormal and inconclusive findings on diagnostic imaging of breast: Secondary | ICD-10-CM | POA: Diagnosis not present

## 2021-07-31 DIAGNOSIS — N6002 Solitary cyst of left breast: Secondary | ICD-10-CM | POA: Diagnosis not present

## 2021-08-12 DIAGNOSIS — M13842 Other specified arthritis, left hand: Secondary | ICD-10-CM | POA: Diagnosis not present

## 2021-08-12 DIAGNOSIS — M79641 Pain in right hand: Secondary | ICD-10-CM | POA: Diagnosis not present

## 2021-08-12 DIAGNOSIS — M79642 Pain in left hand: Secondary | ICD-10-CM | POA: Diagnosis not present

## 2021-08-12 DIAGNOSIS — M13841 Other specified arthritis, right hand: Secondary | ICD-10-CM | POA: Diagnosis not present

## 2021-08-14 DIAGNOSIS — M810 Age-related osteoporosis without current pathological fracture: Secondary | ICD-10-CM | POA: Diagnosis not present

## 2021-08-14 DIAGNOSIS — R928 Other abnormal and inconclusive findings on diagnostic imaging of breast: Secondary | ICD-10-CM | POA: Diagnosis not present

## 2021-12-02 DIAGNOSIS — R799 Abnormal finding of blood chemistry, unspecified: Secondary | ICD-10-CM | POA: Diagnosis not present

## 2021-12-11 DIAGNOSIS — M810 Age-related osteoporosis without current pathological fracture: Secondary | ICD-10-CM | POA: Diagnosis not present

## 2021-12-17 DIAGNOSIS — M810 Age-related osteoporosis without current pathological fracture: Secondary | ICD-10-CM | POA: Diagnosis not present

## 2021-12-19 DIAGNOSIS — M13842 Other specified arthritis, left hand: Secondary | ICD-10-CM | POA: Diagnosis not present

## 2021-12-19 DIAGNOSIS — M13841 Other specified arthritis, right hand: Secondary | ICD-10-CM | POA: Diagnosis not present

## 2022-02-20 DIAGNOSIS — Z23 Encounter for immunization: Secondary | ICD-10-CM | POA: Diagnosis not present

## 2022-04-21 DIAGNOSIS — M79641 Pain in right hand: Secondary | ICD-10-CM | POA: Diagnosis not present

## 2022-04-21 DIAGNOSIS — M13842 Other specified arthritis, left hand: Secondary | ICD-10-CM | POA: Diagnosis not present

## 2022-04-21 DIAGNOSIS — M13841 Other specified arthritis, right hand: Secondary | ICD-10-CM | POA: Diagnosis not present

## 2022-04-21 DIAGNOSIS — M65331 Trigger finger, right middle finger: Secondary | ICD-10-CM | POA: Diagnosis not present

## 2022-04-21 DIAGNOSIS — M79642 Pain in left hand: Secondary | ICD-10-CM | POA: Diagnosis not present

## 2022-06-17 DIAGNOSIS — F17210 Nicotine dependence, cigarettes, uncomplicated: Secondary | ICD-10-CM | POA: Diagnosis not present

## 2022-06-17 DIAGNOSIS — M810 Age-related osteoporosis without current pathological fracture: Secondary | ICD-10-CM | POA: Diagnosis not present

## 2022-06-30 DIAGNOSIS — Z0000 Encounter for general adult medical examination without abnormal findings: Secondary | ICD-10-CM | POA: Diagnosis not present

## 2022-06-30 DIAGNOSIS — R944 Abnormal results of kidney function studies: Secondary | ICD-10-CM | POA: Diagnosis not present

## 2022-06-30 DIAGNOSIS — M810 Age-related osteoporosis without current pathological fracture: Secondary | ICD-10-CM | POA: Diagnosis not present

## 2022-07-02 DIAGNOSIS — M810 Age-related osteoporosis without current pathological fracture: Secondary | ICD-10-CM | POA: Diagnosis not present

## 2022-07-02 DIAGNOSIS — R252 Cramp and spasm: Secondary | ICD-10-CM | POA: Diagnosis not present

## 2022-07-02 DIAGNOSIS — Z0001 Encounter for general adult medical examination with abnormal findings: Secondary | ICD-10-CM | POA: Diagnosis not present

## 2022-07-02 DIAGNOSIS — F17200 Nicotine dependence, unspecified, uncomplicated: Secondary | ICD-10-CM | POA: Diagnosis not present

## 2022-07-02 DIAGNOSIS — G720 Drug-induced myopathy: Secondary | ICD-10-CM | POA: Diagnosis not present

## 2022-07-02 DIAGNOSIS — R944 Abnormal results of kidney function studies: Secondary | ICD-10-CM | POA: Diagnosis not present

## 2022-07-02 DIAGNOSIS — E782 Mixed hyperlipidemia: Secondary | ICD-10-CM | POA: Diagnosis not present

## 2022-07-22 ENCOUNTER — Other Ambulatory Visit (HOSPITAL_COMMUNITY): Payer: Self-pay | Admitting: Internal Medicine

## 2022-07-22 DIAGNOSIS — Z1231 Encounter for screening mammogram for malignant neoplasm of breast: Secondary | ICD-10-CM

## 2022-08-04 ENCOUNTER — Ambulatory Visit (HOSPITAL_COMMUNITY): Payer: PPO

## 2022-10-23 DIAGNOSIS — H04123 Dry eye syndrome of bilateral lacrimal glands: Secondary | ICD-10-CM | POA: Diagnosis not present

## 2022-12-24 DIAGNOSIS — M810 Age-related osteoporosis without current pathological fracture: Secondary | ICD-10-CM | POA: Diagnosis not present

## 2023-03-06 DIAGNOSIS — Z23 Encounter for immunization: Secondary | ICD-10-CM | POA: Diagnosis not present

## 2023-03-16 ENCOUNTER — Encounter (HOSPITAL_COMMUNITY): Payer: Self-pay

## 2023-03-16 ENCOUNTER — Ambulatory Visit (HOSPITAL_COMMUNITY)
Admission: RE | Admit: 2023-03-16 | Discharge: 2023-03-16 | Disposition: A | Discharge status: Home / Self Care | Payer: PPO | Source: Ambulatory Visit | Attending: Internal Medicine | Admitting: Internal Medicine

## 2023-03-16 DIAGNOSIS — Z1231 Encounter for screening mammogram for malignant neoplasm of breast: Secondary | ICD-10-CM | POA: Insufficient documentation

## 2023-06-25 DIAGNOSIS — M1812 Unilateral primary osteoarthritis of first carpometacarpal joint, left hand: Secondary | ICD-10-CM | POA: Diagnosis not present

## 2023-06-25 DIAGNOSIS — M79641 Pain in right hand: Secondary | ICD-10-CM | POA: Diagnosis not present

## 2023-06-25 DIAGNOSIS — M180 Bilateral primary osteoarthritis of first carpometacarpal joints: Secondary | ICD-10-CM | POA: Diagnosis not present

## 2023-06-25 DIAGNOSIS — M79642 Pain in left hand: Secondary | ICD-10-CM | POA: Diagnosis not present

## 2023-07-02 DIAGNOSIS — R944 Abnormal results of kidney function studies: Secondary | ICD-10-CM | POA: Diagnosis not present

## 2023-07-02 DIAGNOSIS — M810 Age-related osteoporosis without current pathological fracture: Secondary | ICD-10-CM | POA: Diagnosis not present

## 2023-07-08 DIAGNOSIS — E782 Mixed hyperlipidemia: Secondary | ICD-10-CM | POA: Diagnosis not present

## 2023-07-08 DIAGNOSIS — M19042 Primary osteoarthritis, left hand: Secondary | ICD-10-CM | POA: Diagnosis not present

## 2023-07-08 DIAGNOSIS — R252 Cramp and spasm: Secondary | ICD-10-CM | POA: Diagnosis not present

## 2023-07-08 DIAGNOSIS — F17210 Nicotine dependence, cigarettes, uncomplicated: Secondary | ICD-10-CM | POA: Diagnosis not present

## 2023-07-08 DIAGNOSIS — M19041 Primary osteoarthritis, right hand: Secondary | ICD-10-CM | POA: Diagnosis not present

## 2023-07-08 DIAGNOSIS — Z0001 Encounter for general adult medical examination with abnormal findings: Secondary | ICD-10-CM | POA: Diagnosis not present

## 2023-07-08 DIAGNOSIS — G720 Drug-induced myopathy: Secondary | ICD-10-CM | POA: Diagnosis not present

## 2023-07-08 DIAGNOSIS — R944 Abnormal results of kidney function studies: Secondary | ICD-10-CM | POA: Diagnosis not present

## 2023-07-08 DIAGNOSIS — M810 Age-related osteoporosis without current pathological fracture: Secondary | ICD-10-CM | POA: Diagnosis not present

## 2023-07-09 ENCOUNTER — Telehealth: Payer: Self-pay

## 2023-07-09 ENCOUNTER — Other Ambulatory Visit: Payer: Self-pay | Admitting: Internal Medicine

## 2023-07-09 DIAGNOSIS — M810 Age-related osteoporosis without current pathological fracture: Secondary | ICD-10-CM | POA: Insufficient documentation

## 2023-07-09 NOTE — Telephone Encounter (Signed)
 Auth Submission: NO AUTH NEEDED Site of care: Site of care: [facility] Payer: [employer] Medication & CPT/J Code(s) submitted: Prolia (Denosumab) [id number] Route of submission (phone, fax, portal): phone Phone # Fax # Auth type: Buy/Bill PB Units/visits requested: 60mg , q67months Reference number: [id number] Approval from: 07/09/23 to 04/13/24

## 2023-07-13 ENCOUNTER — Ambulatory Visit

## 2023-07-15 ENCOUNTER — Encounter: Attending: Internal Medicine | Admitting: *Deleted

## 2023-07-15 ENCOUNTER — Other Ambulatory Visit (HOSPITAL_COMMUNITY): Payer: Self-pay | Admitting: Internal Medicine

## 2023-07-15 VITALS — BP 119/95 | HR 72 | Temp 97.7°F

## 2023-07-15 DIAGNOSIS — M810 Age-related osteoporosis without current pathological fracture: Secondary | ICD-10-CM | POA: Diagnosis not present

## 2023-07-15 MED ORDER — DENOSUMAB 60 MG/ML ~~LOC~~ SOSY
60.0000 mg | PREFILLED_SYRINGE | Freq: Once | SUBCUTANEOUS | Status: AC
  Administered 2023-07-15: 60 mg via SUBCUTANEOUS

## 2023-07-15 NOTE — Progress Notes (Signed)
 Diagnosis: Osteoporosis  Provider:  Dwana Melena MD  Procedure: Injection  Prolia (Denosumab), Dose: 60 mg, Site: subcutaneous, Number of injections: 1  Injection Site(s): Left arm  Post Care: Observation period completed  Discharge: Condition: Good, Destination: Home . AVS Provided  Performed by:  Daleen Squibb, RN

## 2023-07-27 ENCOUNTER — Ambulatory Visit (HOSPITAL_COMMUNITY)
Admission: RE | Admit: 2023-07-27 | Discharge: 2023-07-27 | Disposition: A | Discharge status: Home / Self Care | Source: Ambulatory Visit | Attending: Internal Medicine | Admitting: Internal Medicine

## 2023-07-27 DIAGNOSIS — M810 Age-related osteoporosis without current pathological fracture: Secondary | ICD-10-CM | POA: Insufficient documentation

## 2023-07-27 DIAGNOSIS — Z780 Asymptomatic menopausal state: Secondary | ICD-10-CM | POA: Diagnosis not present

## 2023-08-13 DIAGNOSIS — M1812 Unilateral primary osteoarthritis of first carpometacarpal joint, left hand: Secondary | ICD-10-CM | POA: Diagnosis not present

## 2023-08-13 DIAGNOSIS — G8929 Other chronic pain: Secondary | ICD-10-CM | POA: Diagnosis not present

## 2023-08-13 DIAGNOSIS — M25512 Pain in left shoulder: Secondary | ICD-10-CM | POA: Diagnosis not present

## 2023-08-19 DIAGNOSIS — M1812 Unilateral primary osteoarthritis of first carpometacarpal joint, left hand: Secondary | ICD-10-CM | POA: Diagnosis not present

## 2023-08-19 DIAGNOSIS — M7542 Impingement syndrome of left shoulder: Secondary | ICD-10-CM | POA: Diagnosis not present

## 2023-08-19 DIAGNOSIS — G8929 Other chronic pain: Secondary | ICD-10-CM | POA: Diagnosis not present

## 2023-09-28 DIAGNOSIS — M79672 Pain in left foot: Secondary | ICD-10-CM | POA: Diagnosis not present

## 2023-09-28 DIAGNOSIS — M79671 Pain in right foot: Secondary | ICD-10-CM | POA: Diagnosis not present

## 2023-09-28 DIAGNOSIS — M6289 Other specified disorders of muscle: Secondary | ICD-10-CM | POA: Diagnosis not present

## 2023-10-19 DIAGNOSIS — M7542 Impingement syndrome of left shoulder: Secondary | ICD-10-CM | POA: Diagnosis not present

## 2023-10-19 DIAGNOSIS — M1812 Unilateral primary osteoarthritis of first carpometacarpal joint, left hand: Secondary | ICD-10-CM | POA: Diagnosis not present

## 2023-10-27 DIAGNOSIS — H43393 Other vitreous opacities, bilateral: Secondary | ICD-10-CM | POA: Diagnosis not present

## 2023-11-07 NOTE — Therapy (Signed)
 OUTPATIENT PHYSICAL THERAPY LOWER EXTREMITY EVALUATION   Patient Name: Paige Glass MRN: [id number] DOB:28-Sep-1940, 83 y.o., female Today's Date: 11/12/2023  END OF SESSION:  PT End of Session - 11/12/23 1302     Visit Number 1    Number of Visits 4    Date for PT Re-Evaluation 12/11/23    Authorization Type HTA    Authorization Time Period no auth needed    PT Start Time 1302    PT Stop Time 1342    PT Time Calculation (min) 40 min    Activity Tolerance Patient tolerated treatment well    Behavior During Therapy WFL for tasks assessed/performed          Past Medical History:  Diagnosis Date   Back pain    Joint pain    Joint swelling    Nocturia    Osteoporosis    Past Surgical History:  Procedure Laterality Date   bilateral ear surgery     CATARACT EXTRACTION W/PHACO Right 08/20/2020   Procedure: CATARACT EXTRACTION PHACO AND INTRAOCULAR LENS PLACEMENT RIGHT EYE;  Surgeon: Harrie Agent, MD;  Location: [facility];  Service: Ophthalmology;  Laterality: Right;  right CDE=11.07   CATARACT EXTRACTION W/PHACO Left 09/03/2020   Procedure: CATARACT EXTRACTION PHACO AND INTRAOCULAR LENS PLACEMENT (IOC);  Surgeon: Harrie Agent, MD;  Location: [facility];  Service: Ophthalmology;  Laterality: Left;  CDE 19.15   OPEN REDUCTION INTERNAL FIXATION (ORIF) FOOT LISFRANC FRACTURE Left 07/14/2016   Procedure: OPEN REDUCTION INTERNAL FIXATION (ORIF) FOOT LISFRANC FRACTURE;  Surgeon: Glendia Cordella Hutchinson, MD;  Location: [facility];  Service: Orthopedics;  Laterality: Left;   TRIGGER FINGER RELEASE Right    Patient Active Problem List   Diagnosis Date Noted   Age-related osteoporosis without current pathological fracture 07/09/2023   Achilles tendon contracture, left 09/16/2016   Flexion contracture of joint of left lower leg 08/25/2016   Avulsion fracture of calcaneus with routine healing, left 08/25/2016    PCP: Norleen Hurst, MD  REFERRING PROVIDER: Barton Drape, MD  REFERRING DIAG: M62.89  (ICD-10-CM) - Other specified disorders of muscle  THERAPY DIAG:  Muscle weakness (generalized)  Bilateral leg and foot pain  Difficulty in walking, not elsewhere classified  Rationale for Evaluation and Treatment: Rehabilitation  ONSET DATE: 4 months ago  SUBJECTIVE:   SUBJECTIVE STATEMENT: When I go out to work in my flowers; my legs and calves start burning after 15 minutes and can hardly walk back to the house.  Can sit down for 20 min and it gets better but  Xray of feet negative.  Wearing compression stockings that help with walking in the house.  Also set up for [place] [facility] vascular appt later this week.  Feet stay sore generally.   Having an ultrasound on both feet 8/28 And sees heart and vascular the same day.    PERTINENT HISTORY: Broke heel left foot 2018 Osteoporosis  PAIN:  Are you having pain? Yes: NPRS scale: 5/10 currently up to a 10/10 Pain location: knees and down Pain description: burning Aggravating factors: standing, walking for 15 min Relieving factors: sitting and resting  PRECAUTIONS: None  RED FLAGS: Numbness in her legs   WEIGHT BEARING RESTRICTIONS: No  FALLS:  Has patient fallen in last 6 months? No   OCCUPATION: retired  PLOF: Independent  PATIENT GOALS: work out in my yard and flowers.    NEXT MD VISIT: 12/10/23  OBJECTIVE:  Note: Objective measures were completed at Evaluation unless otherwise noted.  DIAGNOSTIC  FINDINGS:   PATIENT SURVEYS:  LEFS 27/80 33.8% Extreme difficulty/unable (0), Quite a bit of difficulty (1), Moderate difficulty (2), Little difficulty (3), No difficulty (4) Survey date:    Any of your usual work, housework or school activities   2. Usual hobbies, recreational or sporting activities   3. Getting into/out of the bath   4. Walking between rooms   5. Putting on socks/shoes   6. Squatting    7. Lifting an object, like a bag of groceries from the floor   8. Performing light activities around your  home   9. Performing heavy activities around your home   10. Getting into/out of a car   11. Walking 2 blocks   12. Walking 1 mile   13. Going up/down 10 stairs (1 flight)   14. Standing for 1 hour   15.  sitting for 1 hour   16. Running on even ground   17. Running on uneven ground   18. Making sharp turns while running fast   19. Hopping    20. Rolling over in bed   Score total:  27/80     COGNITION: Overall cognitive status: Within functional limits for tasks assessed     SENSATION: Sensitive to touch both lower legs  EDEMA:  Mild bilateral LE's  POSTURE: rounded shoulders, forward head, decreased lumbar lordosis, and increased thoracic kyphosis  PALPATION: Tender bilateral lower extremities generally  LOWER EXTREMITY ROM:  Active ROM Right eval Left eval  Hip flexion    Hip extension    Hip abduction    Hip adduction    Hip internal rotation    Hip external rotation    Knee flexion    Knee extension    Ankle dorsiflexion    Ankle plantarflexion    Ankle inversion    Ankle eversion     (Blank rows = not tested)  LOWER EXTREMITY MMT:*pain  MMT Right eval Left eval  Hip flexion 4 4+  Hip extension    Hip abduction    Hip adduction    Hip internal rotation    Hip external rotation    Knee flexion    Knee extension 4* 4*  Ankle dorsiflexion 4+ 4+  Ankle plantarflexion    Ankle inversion    Ankle eversion     (Blank rows = not tested)   FUNCTIONAL TESTS:  5 times sit to stand: 48.51 sec using hands occasionally and feet burning  GAIT: Distance walked: 50 ft in clinic Assistive device utilized: Single point cane Level of assistance: Modified independence Comments: slight wide bos; decreased gait speed                                                                                                                                TREATMENT DATE: 11/11/23 physical therapy evaluation and HEP instruction    PATIENT EDUCATION:  Education  details: Patient educated on exam findings, POC, scope of  PT, HEP, and what to expect next visit. Person educated: Patient Education method: Explanation, Demonstration, and Handouts Education comprehension: verbalized understanding, returned demonstration, verbal cues required, and tactile cues required  HOME EXERCISE PROGRAM: Access Code: [email or web address] URL: [email or web address] Date: 11/12/2023 Prepared by: [facility]  Exercises - Seated Heel Toe Raises  - 2 x daily - 7 x weekly - 2 sets - 10 reps - Seated March  - 1 x daily - 7 x weekly - 2 sets - 10 reps - Seated Hip Abduction with Resistance  - 1 x daily - 7 x weekly - 2 sets - 10 reps - Seated Long Arc Quad  - 1 x daily - 7 x weekly - 2 sets - 10 reps - standing single leg balance at the counter (try to not hold on)  - 1 x daily - 7 x weekly - 1 sets - 10 reps - 20 sec hold  ASSESSMENT:  CLINICAL IMPRESSION: Patient is a 83 y.o. female who was seen today for physical therapy evaluation and treatment for M62.89 (ICD-10-CM) - Other specified disorders of muscle.  Patient demonstrates muscle weakness, impaired balance and fascial restrictions which are likely contributing to symptoms of pain and are negatively impacting patient ability to perform ADLs and functional mobility tasks. Patient will benefit from skilled physical therapy services to address these deficits to reduce pain and improve level of function with ADLs and functional mobility tasks.   OBJECTIVE IMPAIRMENTS: Abnormal gait, decreased balance, difficulty walking, decreased ROM, decreased strength, increased fascial restrictions, impaired perceived functional ability, and pain.   ACTIVITY LIMITATIONS: standing, squatting, sleeping, stairs, transfers, locomotion level, and caring for others  PARTICIPATION LIMITATIONS: meal prep, cleaning, laundry, shopping, community activity, and yard work  [facility] POTENTIAL: Good  CLINICAL DECISION MAKING: Evolving/moderate  complexity  EVALUATION COMPLEXITY: Moderate   GOALS: Goals reviewed with patient? No  SHORT TERM GOALS: Target date: 11/26/2023 patient will be independent with initial HEP  Baseline: Goal status: INITIAL  2.  Patient will report 30% improvement overall Baseline:  Goal status: INITIAL  LONG TERM GOALS: Target date: 12/11/2023  Patient will be independent in self management strategies to improve quality of life and functional outcomes Baseline:  Goal status: INITIAL  2.  Patient will report 50% improvement overall Baseline:  Goal status: INITIAL  3.  Patient will able to stand SLS on left leg x 10 to demonstrate improved functional balance  Baseline: unable Goal status: INITIAL  4.  Patient will improve LEFS score by 13 points to demonstrate improved perceived function  Baseline: 27/80 Goal status: INITIAL  5.  Patient will improve 5 times sit to stand score to 25 sec or less to demonstrate improved functional mobility and increased leg strength.     Baseline: 48.51 sec using hands to assist Goal status: INITIAL    PLAN:  PT FREQUENCY: 1x/week  PT DURATION: 4 weeks  PLANNED INTERVENTIONS: 97164- PT Re-evaluation, 97110-Therapeutic exercises, 97530- Therapeutic activity, 97112- Neuromuscular re-education, 97535- Self Care, 02859- Manual therapy, [id number]- Gait training, [phone]- Orthotic Fit/training, [phone]- Canalith repositioning, [id number]- Aquatic Therapy, [phone]- Splinting, [phone]- Wound care (first 20 sq cm), 97598- Wound care (each additional 20 sq cm)Patient/Family education, Balance training, Stair training, Taping, Dry Needling, Joint mobilization, Joint manipulation, Spinal manipulation, Spinal mobilization, Scar mobilization, and DME instructions.   PLAN FOR NEXT SESSION: Review HEP and goals; LE strengthening and balance; think patient likely with leg circulation impairment; sees vein and vascular 8/28   1:43 PM, 11/12/23 Shauntea Lok Small Leodis  MPT [place]  physical therapy [place] [phone]

## 2023-11-12 ENCOUNTER — Ambulatory Visit (HOSPITAL_COMMUNITY): Attending: Orthopaedic Surgery

## 2023-11-12 ENCOUNTER — Other Ambulatory Visit: Payer: Self-pay

## 2023-11-12 DIAGNOSIS — M79605 Pain in left leg: Secondary | ICD-10-CM | POA: Diagnosis not present

## 2023-11-12 DIAGNOSIS — M6281 Muscle weakness (generalized): Secondary | ICD-10-CM | POA: Insufficient documentation

## 2023-11-12 DIAGNOSIS — M79672 Pain in left foot: Secondary | ICD-10-CM | POA: Insufficient documentation

## 2023-11-12 DIAGNOSIS — M79671 Pain in right foot: Secondary | ICD-10-CM | POA: Diagnosis not present

## 2023-11-12 DIAGNOSIS — R262 Difficulty in walking, not elsewhere classified: Secondary | ICD-10-CM | POA: Diagnosis not present

## 2023-11-12 DIAGNOSIS — M79604 Pain in right leg: Secondary | ICD-10-CM | POA: Diagnosis not present

## 2023-11-16 ENCOUNTER — Other Ambulatory Visit: Payer: Self-pay | Admitting: *Deleted

## 2023-11-16 DIAGNOSIS — M79606 Pain in leg, unspecified: Secondary | ICD-10-CM

## 2023-11-17 ENCOUNTER — Ambulatory Visit (HOSPITAL_COMMUNITY): Attending: Orthopaedic Surgery | Admitting: Physical Therapy

## 2023-11-17 DIAGNOSIS — M79605 Pain in left leg: Secondary | ICD-10-CM | POA: Insufficient documentation

## 2023-11-17 DIAGNOSIS — R262 Difficulty in walking, not elsewhere classified: Secondary | ICD-10-CM | POA: Insufficient documentation

## 2023-11-17 DIAGNOSIS — M6281 Muscle weakness (generalized): Secondary | ICD-10-CM | POA: Diagnosis not present

## 2023-11-17 DIAGNOSIS — M79672 Pain in left foot: Secondary | ICD-10-CM | POA: Diagnosis not present

## 2023-11-17 DIAGNOSIS — M79671 Pain in right foot: Secondary | ICD-10-CM | POA: Diagnosis not present

## 2023-11-17 DIAGNOSIS — M79604 Pain in right leg: Secondary | ICD-10-CM | POA: Insufficient documentation

## 2023-11-17 NOTE — Therapy (Signed)
 OUTPATIENT PHYSICAL THERAPY LOWER EXTREMITY TREATMENT   Patient Name: Paige Glass MRN: [id number] DOB:December 27, 1940, 83 y.o., female Today's Date: 11/17/2023  END OF SESSION:  PT End of Session - 11/17/23 1058     Visit Number 2    Number of Visits 4    Date for PT Re-Evaluation 12/11/23    Authorization Type HTA    Authorization Time Period no auth needed    Progress Note Due on Visit 4    PT Start Time 1020    PT Stop Time 1100    PT Time Calculation (min) 40 min    Activity Tolerance Patient tolerated treatment well    Behavior During Therapy WFL for tasks assessed/performed           Past Medical History:  Diagnosis Date   Back pain    Joint pain    Joint swelling    Nocturia    Osteoporosis    Past Surgical History:  Procedure Laterality Date   bilateral ear surgery     CATARACT EXTRACTION W/PHACO Right 08/20/2020   Procedure: CATARACT EXTRACTION PHACO AND INTRAOCULAR LENS PLACEMENT RIGHT EYE;  Surgeon: Harrie Agent, MD;  Location: [facility];  Service: Ophthalmology;  Laterality: Right;  right CDE=11.07   CATARACT EXTRACTION W/PHACO Left 09/03/2020   Procedure: CATARACT EXTRACTION PHACO AND INTRAOCULAR LENS PLACEMENT (IOC);  Surgeon: Harrie Agent, MD;  Location: [facility];  Service: Ophthalmology;  Laterality: Left;  CDE 19.15   OPEN REDUCTION INTERNAL FIXATION (ORIF) FOOT LISFRANC FRACTURE Left 07/14/2016   Procedure: OPEN REDUCTION INTERNAL FIXATION (ORIF) FOOT LISFRANC FRACTURE;  Surgeon: Glendia Cordella Hutchinson, MD;  Location: [facility];  Service: Orthopedics;  Laterality: Left;   TRIGGER FINGER RELEASE Right    Patient Active Problem List   Diagnosis Date Noted   Age-related osteoporosis without current pathological fracture 07/09/2023   Achilles tendon contracture, left 09/16/2016   Flexion contracture of joint of left lower leg 08/25/2016   Avulsion fracture of calcaneus with routine healing, left 08/25/2016    PCP: Norleen Hurst, MD  REFERRING PROVIDER: Barton Drape, MD  REFERRING DIAG: M62.89 (ICD-10-CM) - Other specified disorders of muscle  THERAPY DIAG:  Bilateral leg and foot pain  Difficulty in walking, not elsewhere classified  Muscle weakness (generalized)  Rationale for Evaluation and Treatment: Rehabilitation  ONSET DATE: 4 months ago  SUBJECTIVE:   SUBJECTIVE STATEMENT: Pt states her calves and feet burn and worsens when she's up on them.  Currently without any issues but wants to be able to work in her yard and do some walking.    When I go out to work in my flowers; my legs and calves start burning after 15 minutes and can hardly walk back to the house.  Can sit down for 20 min and it gets better but  Xray of feet negative.  Wearing compression stockings that help with walking in the house.  Also set up for [facility] vascular appt later this week.  Feet stay sore generally.   Having an ultrasound on both feet 8/28 And sees heart and vascular the same day.    PERTINENT HISTORY: Broke heel left foot 2018 Osteoporosis  PAIN:  Are you having pain? Yes: NPRS scale: 2/10 currently up to a 10/10 Pain location: knees and down Pain description: burning Aggravating factors: standing, walking for 15 min Relieving factors: sitting and resting  PRECAUTIONS: None  RED FLAGS: Numbness in her legs   WEIGHT BEARING RESTRICTIONS: No  FALLS:  Has  patient fallen in last 6 months? No   OCCUPATION: retired  PLOF: Independent  PATIENT GOALS: work out in my yard and flowers.    NEXT MD VISIT: 12/10/23  OBJECTIVE:  Note: Objective measures were completed at Evaluation unless otherwise noted.  DIAGNOSTIC FINDINGS:   PATIENT SURVEYS:  LEFS 27/80 33.8%  COGNITION: Overall cognitive status: Within functional limits for tasks assessed     SENSATION: Sensitive to touch both lower legs  EDEMA:  Mild bilateral LE's  POSTURE: rounded shoulders, forward head, decreased lumbar lordosis, and increased thoracic  kyphosis  PALPATION: Tender bilateral lower extremities generally  LOWER EXTREMITY ROM:  Active ROM Right eval Left eval  Hip flexion    Hip extension    Hip abduction    Hip adduction    Hip internal rotation    Hip external rotation    Knee flexion    Knee extension    Ankle dorsiflexion    Ankle plantarflexion    Ankle inversion    Ankle eversion     (Blank rows = not tested)  LOWER EXTREMITY MMT:*pain  MMT Right eval Left eval  Hip flexion 4 4+  Hip extension    Hip abduction    Hip adduction    Hip internal rotation    Hip external rotation    Knee flexion    Knee extension 4* 4*  Ankle dorsiflexion 4+ 4+  Ankle plantarflexion    Ankle inversion    Ankle eversion     (Blank rows = not tested)   FUNCTIONAL TESTS:  5 times sit to stand: 48.51 sec using hands occasionally and feet burning  GAIT: Distance walked: 50 ft in clinic Assistive device utilized: Single point cane Level of assistance: Modified independence Comments: slight wide bos; decreased gait speed                                                                                                                                TREATMENT DATE: 11/17/23 Goal and HEP review Seated:  heel/toe raises, marching, hip abd with TB, LAQ all 10 reps each Sit to stands 10X no UE assist Supine:  bridge (attempted but hamstring cramping)  Glut isometric squeezes 10X5  SLR 10X each LE  Hamstring stretch 3X20 with towel Explanation of ETI order form    11/11/23 physical therapy evaluation and HEP instruction    PATIENT EDUCATION:  Education details: Patient educated on exam findings, POC, scope of PT, HEP, and what to expect next visit. Person educated: Patient Education method: Explanation, Demonstration, and Handouts Education comprehension: verbalized understanding, returned demonstration, verbal cues required, and tactile cues required  HOME EXERCISE PROGRAM: Access Code: [email or web address] URL:  [email or web address] Date: 11/12/2023 Prepared by: [facility]  Exercises - Seated Heel Toe Raises  - 2 x daily - 7 x weekly - 2 sets - 10 reps - Seated March  - 1 x daily - 7 x weekly - 2 sets -  10 reps - Seated Hip Abduction with Resistance  - 1 x daily - 7 x weekly - 2 sets - 10 reps - Seated Long Arc Quad  - 1 x daily - 7 x weekly - 2 sets - 10 reps - standing single leg balance at the counter (try to not hold on)  - 1 x daily - 7 x weekly - 1 sets - 10 reps - 20 sec hold  ASSESSMENT:  CLINICAL IMPRESSION: Reviewed goals and POC moving forward.  Pt able to recall her HEP with minimal cues.  Progressed with added LE strengthening exercises today.  Pt with most difficuty maintaining upright posturing/forward gaze with sit to stand activity.  Pt tends to complete in forward flexion and very cautious when descending to sit.  Pt does not present today with compression stockings donned, however reports her legs feel better when she wears them.  Pt requesting best place to get sheer knee highs so given information on ETI and measurements to call and order some more stockings.  Educated on purpose of compression stockings and to wear these daily.  Pt with cramping and extreme tightness in hamstrings.  Added exercises/stretch to help with this.  Pt required moderate cues, specifically for hold times and therex count.  Pt with no questions, increased pain or concerns at end of session today.   Patient will benefit from skilled physical therapy services to address these deficits to reduce pain and improve level of function with ADLs and functional mobility tasks.   OBJECTIVE IMPAIRMENTS: Abnormal gait, decreased balance, difficulty walking, decreased ROM, decreased strength, increased fascial restrictions, impaired perceived functional ability, and pain.   ACTIVITY LIMITATIONS: standing, squatting, sleeping, stairs, transfers, locomotion level, and caring for others  PARTICIPATION  LIMITATIONS: meal prep, cleaning, laundry, shopping, community activity, and yard work  [facility] POTENTIAL: Good  CLINICAL DECISION MAKING: Evolving/moderate complexity  EVALUATION COMPLEXITY: Moderate   GOALS: Goals reviewed with patient? No  SHORT TERM GOALS: Target date: 11/26/2023 patient will be independent with initial HEP  Baseline: Goal status: IN PROGRESS  2.  Patient will report 30% improvement overall Baseline:  Goal status: IN PROGRESS  LONG TERM GOALS: Target date: 12/11/2023  Patient will be independent in self management strategies to improve quality of life and functional outcomes Baseline:  Goal status: IN PROGRESS  2.  Patient will report 50% improvement overall Baseline:  Goal status: IN PROGRESS  3.  Patient will able to stand SLS on left leg x 10 to demonstrate improved functional balance  Baseline: unable Goal status: IN PROGRESS  4.  Patient will improve LEFS score by 13 points to demonstrate improved perceived function  Baseline: 27/80 Goal status: IN PROGRESS  5.  Patient will improve 5 times sit to stand score to 25 sec or less to demonstrate improved functional mobility and increased leg strength.     Baseline: 48.51 sec using hands to assist Goal status: IN PROGRESS   PLAN:  PT FREQUENCY: 1x/week  PT DURATION: 4 weeks  PLANNED INTERVENTIONS: 97164- PT Re-evaluation, 97110-Therapeutic exercises, 97530- Therapeutic activity, 97112- Neuromuscular re-education, 97535- Self Care, 02859- Manual therapy, [id number]- Gait training, [phone]- Orthotic Fit/training, [phone]- Canalith repositioning, [id number]- Aquatic Therapy, [phone]- Splinting, [phone]- Wound care (first 20 sq cm), 97598- Wound care (each additional 20 sq cm)Patient/Family education, Balance training, Stair training, Taping, Dry Needling, Joint mobilization, Joint manipulation, Spinal manipulation, Spinal mobilization, Scar mobilization, and DME instructions.   PLAN FOR NEXT SESSION: progress  LE strengthening and balance; think patient likely  with leg circulation impairment; sees vein and vascular 8/28.   10:59 AM, 11/17/23 Greig KATHEE Fuse, PTA/CLT [facility] Ph: [phone]

## 2023-11-23 ENCOUNTER — Ambulatory Visit (HOSPITAL_COMMUNITY)

## 2023-11-23 ENCOUNTER — Encounter (HOSPITAL_COMMUNITY): Payer: Self-pay

## 2023-11-23 DIAGNOSIS — M6281 Muscle weakness (generalized): Secondary | ICD-10-CM

## 2023-11-23 DIAGNOSIS — R262 Difficulty in walking, not elsewhere classified: Secondary | ICD-10-CM

## 2023-11-23 DIAGNOSIS — M79604 Pain in right leg: Secondary | ICD-10-CM | POA: Diagnosis not present

## 2023-11-23 DIAGNOSIS — M79672 Pain in left foot: Secondary | ICD-10-CM

## 2023-11-23 NOTE — Therapy (Signed)
 OUTPATIENT PHYSICAL THERAPY LOWER EXTREMITY TREATMENT   Patient Name: Paige Glass MRN: [id number] DOB:29-Dec-1940, 83 y.o., female Today's Date: 11/23/2023  END OF SESSION:  PT End of Session - 11/23/23 1106     Visit Number 3    Number of Visits 4    Date for PT Re-Evaluation 12/11/23    Authorization Type HTA    Authorization Time Period no auth needed    Progress Note Due on Visit 4    PT Start Time 1106    PT Stop Time 1145    PT Time Calculation (min) 39 min    Activity Tolerance Patient tolerated treatment well    Behavior During Therapy WFL for tasks assessed/performed           Past Medical History:  Diagnosis Date   Back pain    Joint pain    Joint swelling    Nocturia    Osteoporosis    Past Surgical History:  Procedure Laterality Date   bilateral ear surgery     CATARACT EXTRACTION W/PHACO Right 08/20/2020   Procedure: CATARACT EXTRACTION PHACO AND INTRAOCULAR LENS PLACEMENT RIGHT EYE;  Surgeon: Harrie Agent, MD;  Location: [facility];  Service: Ophthalmology;  Laterality: Right;  right CDE=11.07   CATARACT EXTRACTION W/PHACO Left 09/03/2020   Procedure: CATARACT EXTRACTION PHACO AND INTRAOCULAR LENS PLACEMENT (IOC);  Surgeon: Harrie Agent, MD;  Location: [facility];  Service: Ophthalmology;  Laterality: Left;  CDE 19.15   OPEN REDUCTION INTERNAL FIXATION (ORIF) FOOT LISFRANC FRACTURE Left 07/14/2016   Procedure: OPEN REDUCTION INTERNAL FIXATION (ORIF) FOOT LISFRANC FRACTURE;  Surgeon: Glendia Cordella Hutchinson, MD;  Location: [facility];  Service: Orthopedics;  Laterality: Left;   TRIGGER FINGER RELEASE Right    Patient Active Problem List   Diagnosis Date Noted   Age-related osteoporosis without current pathological fracture 07/09/2023   Achilles tendon contracture, left 09/16/2016   Flexion contracture of joint of left lower leg 08/25/2016   Avulsion fracture of calcaneus with routine healing, left 08/25/2016    PCP: Norleen Hurst, MD  REFERRING PROVIDER: Barton Drape, MD  REFERRING DIAG: M62.89 (ICD-10-CM) - Other specified disorders of muscle  THERAPY DIAG:  Bilateral leg and foot pain  Difficulty in walking, not elsewhere classified  Muscle weakness (generalized)  Rationale for Evaluation and Treatment: Rehabilitation  ONSET DATE: 4 months ago  SUBJECTIVE:   SUBJECTIVE STATEMENT: Pt reports her legs feel the same. Not much change since last visit. Reports she still can't stand up and sit down real slow, still flopping. Reports she went out and pulled weeds this weekend, lasted 20 minutes and needs 20 min rest to recover.    EVAL: When I go out to work in my flowers; my legs and calves start burning after 15 minutes and can hardly walk back to the house.  Can sit down for 20 min and it gets better but  Xray of feet negative.  Wearing compression stockings that help with walking in the house.  Also set up for [place] vascular appt later this week.  Feet stay sore generally.   Having an ultrasound on both feet 8/28 And sees heart and vascular the same day.    PERTINENT HISTORY: Broke heel left foot 2018 Osteoporosis  PAIN:  Are you having pain? Yes: NPRS scale: 2/10 currently up to a 10/10 Pain location: knees and down Pain description: burning Aggravating factors: standing, walking for 15 min Relieving factors: sitting and resting  PRECAUTIONS: None  RED FLAGS: Numbness  in her legs   WEIGHT BEARING RESTRICTIONS: No  FALLS:  Has patient fallen in last 6 months? No   OCCUPATION: retired  PLOF: Independent  PATIENT GOALS: work out in my yard and flowers.    NEXT MD VISIT: 12/10/23  OBJECTIVE:  Note: Objective measures were completed at Evaluation unless otherwise noted.  DIAGNOSTIC FINDINGS:   PATIENT SURVEYS:  LEFS 27/80 33.8%  COGNITION: Overall cognitive status: Within functional limits for tasks assessed     SENSATION: Sensitive to touch both lower legs  EDEMA:  Mild bilateral LE's  POSTURE:  rounded shoulders, forward head, decreased lumbar lordosis, and increased thoracic kyphosis  PALPATION: Tender bilateral lower extremities generally  LOWER EXTREMITY ROM:  Active ROM Right eval Left eval  Hip flexion    Hip extension    Hip abduction    Hip adduction    Hip internal rotation    Hip external rotation    Knee flexion    Knee extension    Ankle dorsiflexion    Ankle plantarflexion    Ankle inversion    Ankle eversion     (Blank rows = not tested)  LOWER EXTREMITY MMT:*pain  MMT Right eval Left eval  Hip flexion 4 4+  Hip extension    Hip abduction    Hip adduction    Hip internal rotation    Hip external rotation    Knee flexion    Knee extension 4* 4*  Ankle dorsiflexion 4+ 4+  Ankle plantarflexion    Ankle inversion    Ankle eversion     (Blank rows = not tested)   FUNCTIONAL TESTS:  5 times sit to stand: 48.51 sec using hands occasionally and feet burning  GAIT: Distance walked: 50 ft in clinic Assistive device utilized: Single point cane Level of assistance: Modified independence Comments: slight wide bos; decreased gait speed                                                                                                                                TREATMENT DATE: 11/23/23: NuStep, seat 5, level, 1.5 min, reports inc burning in feet STS, 10x elevated seated height (black foam in chair) to aid in ecc control  Standing:  Hamstring curls, 2x10, slight cramping  Hip extension, 2x10 Hip abduction, 2x10 Heel raises, 3x10 Seated Hamstring stretch, 30x2  11/17/23 Goal and HEP review Seated:  heel/toe raises, marching, hip abd with TB, LAQ all 10 reps each Sit to stands 10X no UE assist Supine:  bridge (attempted but hamstring cramping)  Glut isometric squeezes 10X5  SLR 10X each LE  Hamstring stretch 3X20 with towel Explanation of ETI order form    11/11/23 physical therapy evaluation and HEP instruction    PATIENT EDUCATION:   Education details: Patient educated on exam findings, POC, scope of PT, HEP, and what to expect next visit. Person educated: Patient Education method: Explanation, Demonstration, and Handouts Education comprehension: verbalized understanding,  returned demonstration, verbal cues required, and tactile cues required  HOME EXERCISE PROGRAM: Access Code: [email or web address] URL: [email or web address] Date: 11/12/2023 Prepared by: [facility]  Exercises - Seated Heel Toe Raises  - 2 x daily - 7 x weekly - 2 sets - 10 reps - Seated March  - 1 x daily - 7 x weekly - 2 sets - 10 reps - Seated Hip Abduction with Resistance  - 1 x daily - 7 x weekly - 2 sets - 10 reps - Seated Long Arc Quad  - 1 x daily - 7 x weekly - 2 sets - 10 reps - standing single leg balance at the counter (try to not hold on)  - 1 x daily - 7 x weekly - 1 sets - 10 reps - 20 sec hold  ASSESSMENT:  CLINICAL IMPRESSION: Session began on NuStep, limited to 1.5 minutes due to increased burning into both feet. Followed with STS from standard chair height. Dec eccentric control noted. Improved with building height of chair. Remainder of session spent with LE strengthening, specifically both hamstring and glute max. Patient slightly limited due to cramping during standing hamstring curls. All other LE exercises tolerated well. Patient will benefit from skilled physical therapy services to address these deficits to reduce pain and improve level of function with ADLs and functional mobility tasks.    OBJECTIVE IMPAIRMENTS: Abnormal gait, decreased balance, difficulty walking, decreased ROM, decreased strength, increased fascial restrictions, impaired perceived functional ability, and pain.   ACTIVITY LIMITATIONS: standing, squatting, sleeping, stairs, transfers, locomotion level, and caring for others  PARTICIPATION LIMITATIONS: meal prep, cleaning, laundry, shopping, community activity, and yard work  [facility] POTENTIAL:  Good  CLINICAL DECISION MAKING: Evolving/moderate complexity  EVALUATION COMPLEXITY: Moderate   GOALS: Goals reviewed with patient? No  SHORT TERM GOALS: Target date: 11/26/2023 patient will be independent with initial HEP  Baseline: Goal status: IN PROGRESS  2.  Patient will report 30% improvement overall Baseline:  Goal status: IN PROGRESS  LONG TERM GOALS: Target date: 12/11/2023  Patient will be independent in self management strategies to improve quality of life and functional outcomes Baseline:  Goal status: IN PROGRESS  2.  Patient will report 50% improvement overall Baseline:  Goal status: IN PROGRESS  3.  Patient will able to stand SLS on left leg x 10 to demonstrate improved functional balance  Baseline: unable Goal status: IN PROGRESS  4.  Patient will improve LEFS score by 13 points to demonstrate improved perceived function  Baseline: 27/80 Goal status: IN PROGRESS  5.  Patient will improve 5 times sit to stand score to 25 sec or less to demonstrate improved functional mobility and increased leg strength.     Baseline: 48.51 sec using hands to assist Goal status: IN PROGRESS   PLAN:  PT FREQUENCY: 1x/week  PT DURATION: 4 weeks  PLANNED INTERVENTIONS: 97164- PT Re-evaluation, 97110-Therapeutic exercises, 97530- Therapeutic activity, 97112- Neuromuscular re-education, 97535- Self Care, [id number]- Manual therapy, [id number]- Gait training, [phone]- Orthotic Fit/training, [phone]- Canalith repositioning, [id number]- Aquatic Therapy, [phone]- Splinting, [phone]- Wound care (first 20 sq cm), 97598- Wound care (each additional 20 sq cm)Patient/Family education, Balance training, Stair training, Taping, Dry Needling, Joint mobilization, Joint manipulation, Spinal manipulation, Spinal mobilization, Scar mobilization, and DME instructions.   PLAN FOR NEXT SESSION: progress LE strengthening and balance; think patient likely with leg circulation impairment; sees vein and vascular  8/28.   11:46 AM, 11/23/23 Rosaria Settler, PT, DPT [facility] [place]

## 2023-12-02 ENCOUNTER — Encounter (HOSPITAL_COMMUNITY): Payer: Self-pay

## 2023-12-02 ENCOUNTER — Ambulatory Visit (HOSPITAL_COMMUNITY)

## 2023-12-02 ENCOUNTER — Encounter (HOSPITAL_COMMUNITY): Admitting: Physical Therapy

## 2023-12-02 DIAGNOSIS — M79604 Pain in right leg: Secondary | ICD-10-CM | POA: Diagnosis not present

## 2023-12-02 DIAGNOSIS — R262 Difficulty in walking, not elsewhere classified: Secondary | ICD-10-CM

## 2023-12-02 DIAGNOSIS — M6281 Muscle weakness (generalized): Secondary | ICD-10-CM

## 2023-12-02 DIAGNOSIS — M79605 Pain in left leg: Secondary | ICD-10-CM

## 2023-12-02 NOTE — Therapy (Signed)
 OUTPATIENT PHYSICAL THERAPY LOWER EXTREMITY TREATMENT   Patient Name: Paige Glass MRN: [id number] DOB:[birth year], 83 y.o., female Today's Date: 12/02/2023  END OF SESSION:  PT End of Session - 12/02/23 1302     Visit Number 4    Number of Visits 4    Date for PT Re-Evaluation 12/11/23    Authorization Type HTA    Authorization Time Period no auth needed    Authorization - Visit Number 3    Authorization - Number of Visits 4    Progress Note Due on Visit 4    PT Start Time 1302    PT Stop Time 1340    PT Time Calculation (min) 38 min    Activity Tolerance Patient tolerated treatment well    Behavior During Therapy WFL for tasks assessed/performed            Past Medical History:  Diagnosis Date   Back pain    Joint pain    Joint swelling    Nocturia    Osteoporosis    Past Surgical History:  Procedure Laterality Date   bilateral ear surgery     CATARACT EXTRACTION W/PHACO Right 08/20/2020   Procedure: CATARACT EXTRACTION PHACO AND INTRAOCULAR LENS PLACEMENT RIGHT EYE;  Surgeon: Harrie Agent, MD;  Location: [facility];  Service: Ophthalmology;  Laterality: Right;  right CDE=11.07   CATARACT EXTRACTION W/PHACO Left 09/03/2020   Procedure: CATARACT EXTRACTION PHACO AND INTRAOCULAR LENS PLACEMENT (IOC);  Surgeon: Harrie Agent, MD;  Location: [facility];  Service: Ophthalmology;  Laterality: Left;  CDE 19.15   OPEN REDUCTION INTERNAL FIXATION (ORIF) FOOT LISFRANC FRACTURE Left 07/14/2016   Procedure: OPEN REDUCTION INTERNAL FIXATION (ORIF) FOOT LISFRANC FRACTURE;  Surgeon: Glendia Cordella Hutchinson, MD;  Location: [facility];  Service: Orthopedics;  Laterality: Left;   TRIGGER FINGER RELEASE Right    Patient Active Problem List   Diagnosis Date Noted   Age-related osteoporosis without current pathological fracture 07/09/2023   Achilles tendon contracture, left 09/16/2016   Flexion contracture of joint of left lower leg 08/25/2016   Avulsion fracture of calcaneus with routine healing,  left 08/25/2016    PCP: Norleen Hurst, MD  REFERRING PROVIDER: Barton Drape, MD  REFERRING DIAG: M62.89 (ICD-10-CM) - Other specified disorders of muscle  THERAPY DIAG:  Bilateral leg and foot pain  Difficulty in walking, not elsewhere classified  Muscle weakness (generalized)  Rationale for Evaluation and Treatment: Rehabilitation  ONSET DATE: 4 months ago  SUBJECTIVE:   SUBJECTIVE STATEMENT: Pt continues to report unable to walk for longer than 15 minutes without burning sensation in bilateral lower extremities.   EVAL: When I go out to work in my flowers; my legs and calves start burning after 15 minutes and can hardly walk back to the house.  Can sit down for 20 min and it gets better but  Xray of feet negative.  Wearing compression stockings that help with walking in the house.  Also set up for [place] vascular appt later this week.  Feet stay sore generally.   Having an ultrasound on both feet 8/28 And sees heart and vascular the same day.    PERTINENT HISTORY: Broke heel left foot 2018 Osteoporosis  PAIN:  Are you having pain? Yes: NPRS scale: 2/10 currently up to a 10/10 Pain location: knees and down Pain description: burning Aggravating factors: standing, walking for 15 min Relieving factors: sitting and resting  PRECAUTIONS: None  RED FLAGS: Numbness in her legs   WEIGHT BEARING RESTRICTIONS: No  FALLS:  Has patient fallen in last 6 months? No   OCCUPATION: retired  PLOF: Independent  PATIENT GOALS: work out in my yard and flowers.    NEXT MD VISIT: 12/10/23  OBJECTIVE:  Note: Objective measures were completed at Evaluation unless otherwise noted.  DIAGNOSTIC FINDINGS:   PATIENT SURVEYS:  LEFS 27/80 33.8%  COGNITION: Overall cognitive status: Within functional limits for tasks assessed     SENSATION: Sensitive to touch both lower legs  EDEMA:  Mild bilateral LE's  POSTURE: rounded shoulders, forward head, decreased lumbar  lordosis, and increased thoracic kyphosis  PALPATION: Tender bilateral lower extremities generally  LOWER EXTREMITY ROM:  Active ROM Right eval Left eval  Hip flexion    Hip extension    Hip abduction    Hip adduction    Hip internal rotation    Hip external rotation    Knee flexion    Knee extension    Ankle dorsiflexion    Ankle plantarflexion    Ankle inversion    Ankle eversion     (Blank rows = not tested)  LOWER EXTREMITY MMT:*pain  MMT Right eval Left eval  Hip flexion 4 4+  Hip extension    Hip abduction    Hip adduction    Hip internal rotation    Hip external rotation    Knee flexion    Knee extension 4* 4*  Ankle dorsiflexion 4+ 4+  Ankle plantarflexion    Ankle inversion    Ankle eversion     (Blank rows = not tested)   FUNCTIONAL TESTS:  5 times sit to stand: 48.51 sec using hands occasionally and feet burning  GAIT: Distance walked: 50 ft in clinic Assistive device utilized: Single point cane Level of assistance: Modified independence Comments: slight wide bos; decreased gait speed                                                                                                                                TREATMENT DATE: 12/02/2023  Therapeutic Exercise: -Stationary bike 1 minute 50 seconds, ceased due to burning pain -STS, 2 sets of 5 reps throughout session, pt cued to decreased UE support and increase speed of descent -Step up and overs, 1 set of 5 reps, pt cued for decreased UE support -Lateral step up and overs, 6 inch steps, 1 set of 5 reps bilaterally, pt cued for bigger steps Neuromuscular Re-education: -Lateral stepping 4 laps 20 feet per lap, second 2 with tidal tank sphere, pt cued for upright posture -SLS trials, 30 second bouts, in parallel bars, pt unable to hold for longer than 5 seconds, UE compensation encouraged over LE compensation  11/23/23: NuStep, seat 5, level, 1.5 min, reports inc burning in feet STS, 10x  elevated seated height (black foam in chair) to aid in ecc control  Standing:  Hamstring curls, 2x10, slight cramping  Hip extension, 2x10 Hip abduction, 2x10 Heel raises, 3x10 Seated Hamstring stretch,  30x2  11/17/23 Goal and HEP review Seated:  heel/toe raises, marching, hip abd with TB, LAQ all 10 reps each Sit to stands 10X no UE assist Supine:  bridge (attempted but hamstring cramping)  Glut isometric squeezes 10X5  SLR 10X each LE  Hamstring stretch 3X20 with towel Explanation of ETI order form    PATIENT EDUCATION:  Education details: Patient educated on exam findings, POC, scope of PT, HEP, and what to expect next visit. Person educated: Patient Education method: Explanation, Demonstration, and Handouts Education comprehension: verbalized understanding, returned demonstration, verbal cues required, and tactile cues required  HOME EXERCISE PROGRAM: Access Code: [email or web address] URL: [email or web address] Date: 11/12/2023 Prepared by: [facility]  Exercises - Seated Heel Toe Raises  - 2 x daily - 7 x weekly - 2 sets - 10 reps - Seated March  - 1 x daily - 7 x weekly - 2 sets - 10 reps - Seated Hip Abduction with Resistance  - 1 x daily - 7 x weekly - 2 sets - 10 reps - Seated Long Arc Quad  - 1 x daily - 7 x weekly - 2 sets - 10 reps - standing single leg balance at the counter (try to not hold on)  - 1 x daily - 7 x weekly - 1 sets - 10 reps - 20 sec hold  ASSESSMENT:  CLINICAL IMPRESSION: Patient continues to demonstrate increased burning pain with walking and exercise, decreased LE strength, decreased gait quality and balance. Patient also demonstrates decreased endurance with aerobic based exercise during today's session, only able to complete 1 minute and 50 seconds on stationary bike. Patient able to progress dynamic balance and core activation exercises today with step up variations and leg press, good performance with verbal cueing. Pts symptoms seems to  be related to circulation issues and therapy restricted due to rest breaks needed due to this sensation, look forward to upcoming appointment with cardiovascular for further investigation, will continue to track symptoms. Patient would continue to benefit from skilled physical therapy for increased endurance with ambulation, increased LE strength, and improved balance for improved quality of life, improved independence with gait training and continued progress towards therapy goals.     OBJECTIVE IMPAIRMENTS: Abnormal gait, decreased balance, difficulty walking, decreased ROM, decreased strength, increased fascial restrictions, impaired perceived functional ability, and pain.   ACTIVITY LIMITATIONS: standing, squatting, sleeping, stairs, transfers, locomotion level, and caring for others  PARTICIPATION LIMITATIONS: meal prep, cleaning, laundry, shopping, community activity, and yard work  [facility] POTENTIAL: Good  CLINICAL DECISION MAKING: Evolving/moderate complexity  EVALUATION COMPLEXITY: Moderate   GOALS: Goals reviewed with patient? No  SHORT TERM GOALS: Target date: 11/26/2023 patient will be independent with initial HEP  Baseline: Goal status: MET  2.  Patient will report 30% improvement overall Baseline:  Goal status: IN PROGRESS  LONG TERM GOALS: Target date: 12/11/2023  Patient will be independent in self management strategies to improve quality of life and functional outcomes Baseline:  Goal status: MET  2.  Patient will report 50% improvement overall Baseline:  Goal status: IN PROGRESS  3.  Patient will able to stand SLS on left leg x 10 to demonstrate improved functional balance  Baseline: unable Goal status: IN PROGRESS  4.  Patient will improve LEFS score by 13 points to demonstrate improved perceived function  Baseline: 27/80 Goal status: IN PROGRESS  5.  Patient will improve 5 times sit to stand score to 25 sec or less to demonstrate improved  functional mobility  and increased leg strength.     Baseline: 48.51 sec using hands to assist; 12/02/23: 41.03 seconds no UE support Goal status: IN PROGRESS   PLAN:  PT FREQUENCY: 1x/week  PT DURATION: 4 weeks  PLANNED INTERVENTIONS: 97164- PT Re-evaluation, 97110-Therapeutic exercises, 97530- Therapeutic activity, 97112- Neuromuscular re-education, 97535- Self Care, 02859- Manual therapy, [id number]- Gait training, [phone]- Orthotic Fit/training, [phone]- Canalith repositioning, [id number]- Aquatic Therapy, [phone]- Splinting, [phone]- Wound care (first 20 sq cm), 97598- Wound care (each additional 20 sq cm)Patient/Family education, Balance training, Stair training, Taping, Dry Needling, Joint mobilization, Joint manipulation, Spinal manipulation, Spinal mobilization, Scar mobilization, and DME instructions.   PLAN FOR NEXT SESSION: progress LE strengthening and balance; think patient likely with leg circulation impairment; sees vein and vascular 8/28. Plan to discharge next visit due to going to cardiovascular doctor 28th, educate on new referral should therapy be the best option.    Fawna Cranmer, PT, DPT [facility] [place] Office: [phone] 1:50 PM, 12/02/23

## 2023-12-10 ENCOUNTER — Encounter (HOSPITAL_COMMUNITY)

## 2023-12-11 ENCOUNTER — Ambulatory Visit (HOSPITAL_COMMUNITY)
Admission: RE | Admit: 2023-12-11 | Discharge: 2023-12-11 | Disposition: A | Discharge status: Home / Self Care | Source: Ambulatory Visit | Attending: Vascular Surgery | Admitting: Vascular Surgery

## 2023-12-11 ENCOUNTER — Encounter: Payer: Self-pay | Admitting: Vascular Surgery

## 2023-12-11 ENCOUNTER — Other Ambulatory Visit: Payer: Self-pay

## 2023-12-11 ENCOUNTER — Ambulatory Visit: Attending: Vascular Surgery | Admitting: Vascular Surgery

## 2023-12-11 VITALS — BP 128/78 | HR 63 | Temp 97.9°F | Ht 59.0 in | Wt 106.0 lb

## 2023-12-11 DIAGNOSIS — I70221 Atherosclerosis of native arteries of extremities with rest pain, right leg: Secondary | ICD-10-CM | POA: Diagnosis not present

## 2023-12-11 DIAGNOSIS — I70222 Atherosclerosis of native arteries of extremities with rest pain, left leg: Secondary | ICD-10-CM

## 2023-12-11 DIAGNOSIS — I739 Peripheral vascular disease, unspecified: Secondary | ICD-10-CM

## 2023-12-11 DIAGNOSIS — M79606 Pain in leg, unspecified: Secondary | ICD-10-CM | POA: Insufficient documentation

## 2023-12-11 NOTE — Progress Notes (Signed)
 Patient ID: Paige Glass, female   DOB: [birth year], 83 y.o.   MRN: [id number]  Reason for Consult: New Patient (Initial Visit)   Referred by Barton Drape, MD  Subjective:     HPI Paige Glass is a 83 y.o. female presenting for evaluation of leg pain.  She complains of significant calf pain with ambulation.  Has been happening for about the last 6 months.  She is a current smoker.  She denies nonhealing wounds although does have significant pain in her feet at night.  She describes the pain as burning, numbness, tingling but also throbbing which improves with dangling her foot off the bed.  She denies any previous medical history other than a left Achilles rupture.  Past Medical History:  Diagnosis Date   Back pain    Joint pain    Joint swelling    Nocturia    Osteoporosis    Family History  Problem Relation Age of Onset   Healthy Mother    Healthy Father    Past Surgical History:  Procedure Laterality Date   bilateral ear surgery     CATARACT EXTRACTION W/PHACO Right 08/20/2020   Procedure: CATARACT EXTRACTION PHACO AND INTRAOCULAR LENS PLACEMENT RIGHT EYE;  Surgeon: Harrie Agent, MD;  Location: [facility];  Service: Ophthalmology;  Laterality: Right;  right CDE=11.07   CATARACT EXTRACTION W/PHACO Left 09/03/2020   Procedure: CATARACT EXTRACTION PHACO AND INTRAOCULAR LENS PLACEMENT (IOC);  Surgeon: Harrie Agent, MD;  Location: [facility];  Service: Ophthalmology;  Laterality: Left;  CDE 19.15   OPEN REDUCTION INTERNAL FIXATION (ORIF) FOOT LISFRANC FRACTURE Left 07/14/2016   Procedure: OPEN REDUCTION INTERNAL FIXATION (ORIF) FOOT LISFRANC FRACTURE;  Surgeon: Glendia Cordella Hutchinson, MD;  Location: [facility];  Service: Orthopedics;  Laterality: Left;   TRIGGER FINGER RELEASE Right     Short Social History:  Social History   Tobacco Use   Smoking status: Every Day    Current packs/day: 0.50    Average packs/day: 0.5 packs/day for 32.3 years (16.2 ttl pk-yrs)    Types: Cigarettes     Start date: 08/12/1991   Smokeless tobacco: Never  Substance Use Topics   Alcohol use: No    No Known Allergies  Current Outpatient Medications  Medication Sig Dispense Refill   acetaminophen  (TYLENOL ) 500 MG tablet Take 500 mg by mouth at bedtime.     Biotin w/ Vitamins C & E (HAIR/SKIN/NAILS PO) Take 2 tablets by mouth daily.     CALCIUM PO Take 1,200 mg by mouth daily. 600 mg each     PROLIA  60 MG/ML SOSY injection Inject 60 mg into the skin every 6 (six) months.     No current facility-administered medications for this visit.    REVIEW OF SYSTEMS  All other systems were reviewed and are negative     Objective:  Objective   Vitals:   12/11/23 1517  BP: 128/78  Pulse: 63  Temp: 97.9 F (36.6 C)  SpO2: 92%  Weight: 106 lb (48.1 kg)  Height: 4' 11 (1.499 m)   Body mass index is 21.41 kg/m.  Physical Exam General: no acute distress Cardiac: hemodynamically stable Pulm: normal work of breathing Abdomen: non-tender, no pulsatile mass Neuro: alert, no focal deficit Extremities: no edema, cyanosis or wounds Vascular:   Right: Palpable femoral, nonpalpable pedal's  Left: Palpable femoral, nonpalpable pedal's  Data: ABI +---------+------------------+-----+----------+--------+  Right   Rt Pressure (mmHg)IndexWaveform  Comment   +---------+------------------+-----+----------+--------+  Brachial 135  triphasic           +---------+------------------+-----+----------+--------+  PTA     90                0.61 monophasic          +---------+------------------+-----+----------+--------+  DP      106               0.72 monophasic          +---------+------------------+-----+----------+--------+  Great Toe63                0.43                     +---------+------------------+-----+----------+--------+   +---------+------------------+-----+----------+-------+  Left    Lt Pressure (mmHg)IndexWaveform  Comment   +---------+------------------+-----+----------+-------+  Brachial 148                    triphasic          +---------+------------------+-----+----------+-------+  PTA     98                0.66 monophasic         +---------+------------------+-----+----------+-------+  DP      91                0.61 monophasic         +---------+------------------+-----+----------+-------+  Great Toe49                0.33                    +---------+------------------+-----+----------+-------+      Assessment/Plan:   Paige Glass is a 83 y.o. female with PAD with CL TI with rest pain.  We discussed the risk factors and natural history of PAD.  I also explained the grading of PAD and discussed the patients with rest pain or nonhealing wounds or at a higher risk of amputation in the future.  We discussed angiogram in order to obtain further information about her perfusion and potentially intervene at that point. We discussed the risks and benefits of angiogram, her and her family elected to proceed.  Will plan to start with bilateral angiogram and left leg intervention from a right groin access  Paige Glass has atherosclerosis of the native arteries of the Bilateral lower extremities causing ischemic rest pain. The patient is on best medical therapy for peripheral arterial disease. The patient has been counseled about the risks of tobacco use in atherosclerotic disease. The patient has been counseled to abstain from any tobacco use. An aortogram with bilateral lower extremity runoff angiography and Bilateral lower extremity intervention and is indicated to better evaluate the patient's lower extremity circulation because of the chronic limb threatening nature of the patient's diagnosis. Based on the patient's clinical exam and non-invasive data, we anticipate an endovascular intervention in the femoropopliteal and tibial vessels. Stenting and/or athrectomy would be favored because of  the improved primary patency of these interventions as compared to plain balloon angioplasty.  Encouraged to start 81 mg aspirin daily  Recommendations to optimize cardiovascular risk: Abstinence from all tobacco products. Blood glucose control with goal A1c < 7%. Blood pressure control with goal blood pressure < 140/90 mmHg. Lipid reduction therapy with goal LDL-C <100 mg/dL  Aspirin 81mg  PO QD.  Atorvastatin 40-80mg  PO QD (or other high intensity statin therapy).   Paige GORMAN Serve MD [facility]

## 2023-12-12 LAB — VAS US ABI WITH/WO TBI
Left ABI: 0.66
Right ABI: 0.72

## 2023-12-31 DIAGNOSIS — M810 Age-related osteoporosis without current pathological fracture: Secondary | ICD-10-CM | POA: Diagnosis not present

## 2023-12-31 DIAGNOSIS — R944 Abnormal results of kidney function studies: Secondary | ICD-10-CM | POA: Diagnosis not present

## 2024-01-07 DIAGNOSIS — M19041 Primary osteoarthritis, right hand: Secondary | ICD-10-CM | POA: Diagnosis not present

## 2024-01-07 DIAGNOSIS — F17210 Nicotine dependence, cigarettes, uncomplicated: Secondary | ICD-10-CM | POA: Diagnosis not present

## 2024-01-07 DIAGNOSIS — Z23 Encounter for immunization: Secondary | ICD-10-CM | POA: Diagnosis not present

## 2024-01-07 DIAGNOSIS — I70223 Atherosclerosis of native arteries of extremities with rest pain, bilateral legs: Secondary | ICD-10-CM | POA: Diagnosis not present

## 2024-01-07 DIAGNOSIS — R944 Abnormal results of kidney function studies: Secondary | ICD-10-CM | POA: Diagnosis not present

## 2024-01-07 DIAGNOSIS — I739 Peripheral vascular disease, unspecified: Secondary | ICD-10-CM | POA: Diagnosis not present

## 2024-01-07 DIAGNOSIS — M19042 Primary osteoarthritis, left hand: Secondary | ICD-10-CM | POA: Diagnosis not present

## 2024-01-07 DIAGNOSIS — F17200 Nicotine dependence, unspecified, uncomplicated: Secondary | ICD-10-CM | POA: Diagnosis not present

## 2024-01-07 DIAGNOSIS — M810 Age-related osteoporosis without current pathological fracture: Secondary | ICD-10-CM | POA: Diagnosis not present

## 2024-01-07 DIAGNOSIS — M7542 Impingement syndrome of left shoulder: Secondary | ICD-10-CM | POA: Diagnosis not present

## 2024-01-07 DIAGNOSIS — E782 Mixed hyperlipidemia: Secondary | ICD-10-CM | POA: Diagnosis not present

## 2024-01-07 DIAGNOSIS — G720 Drug-induced myopathy: Secondary | ICD-10-CM | POA: Diagnosis not present

## 2024-01-18 ENCOUNTER — Ambulatory Visit

## 2024-01-25 ENCOUNTER — Other Ambulatory Visit: Payer: Self-pay

## 2024-01-25 ENCOUNTER — Ambulatory Visit (HOSPITAL_COMMUNITY)
Admission: RE | Admit: 2024-01-25 | Discharge: 2024-01-25 | Disposition: A | Discharge status: Home / Self Care | Attending: Vascular Surgery | Admitting: Vascular Surgery

## 2024-01-25 ENCOUNTER — Encounter (HOSPITAL_COMMUNITY)
Admission: RE | Disposition: A | Discharge status: Home / Self Care | Payer: Self-pay | Source: Home / Self Care | Attending: Vascular Surgery

## 2024-01-25 DIAGNOSIS — Z7982 Long term (current) use of aspirin: Secondary | ICD-10-CM | POA: Diagnosis not present

## 2024-01-25 DIAGNOSIS — Z79899 Other long term (current) drug therapy: Secondary | ICD-10-CM | POA: Insufficient documentation

## 2024-01-25 DIAGNOSIS — I70223 Atherosclerosis of native arteries of extremities with rest pain, bilateral legs: Secondary | ICD-10-CM | POA: Diagnosis present

## 2024-01-25 DIAGNOSIS — I70222 Atherosclerosis of native arteries of extremities with rest pain, left leg: Secondary | ICD-10-CM

## 2024-01-25 DIAGNOSIS — F17210 Nicotine dependence, cigarettes, uncomplicated: Secondary | ICD-10-CM | POA: Diagnosis not present

## 2024-01-25 DIAGNOSIS — Z720 Tobacco use: Secondary | ICD-10-CM

## 2024-01-25 HISTORY — PX: LOWER EXTREMITY INTERVENTION: CATH118252

## 2024-01-25 HISTORY — PX: ABDOMINAL AORTOGRAM W/LOWER EXTREMITY: CATH118223

## 2024-01-25 HISTORY — PX: LOWER EXTREMITY ANGIOGRAPHY: CATH118251

## 2024-01-25 LAB — POCT I-STAT, CHEM 8
BUN: 16 mg/dL (ref 8–23)
Calcium, Ion: 1.21 mmol/L (ref 1.15–1.40)
Chloride: 100 mmol/L (ref 98–111)
Creatinine, Ser: 1.2 mg/dL — ABNORMAL HIGH (ref 0.44–1.00)
Glucose, Bld: 90 mg/dL (ref 70–99)
HCT: 44 % (ref 36.0–46.0)
Hemoglobin: 15 g/dL (ref 12.0–15.0)
Potassium: 4 mmol/L (ref 3.5–5.1)
Sodium: 142 mmol/L (ref 135–145)
TCO2: 30 mmol/L (ref 22–32)

## 2024-01-25 SURGERY — ABDOMINAL AORTOGRAM W/LOWER EXTREMITY
Anesthesia: LOCAL

## 2024-01-25 MED ORDER — IODIXANOL 320 MG/ML IV SOLN
INTRAVENOUS | Status: DC | PRN
  Administered 2024-01-25: 80 mL

## 2024-01-25 MED ORDER — LIDOCAINE HCL (PF) 1 % IJ SOLN
INTRAMUSCULAR | Status: AC
  Filled 2024-01-25: qty 30

## 2024-01-25 MED ORDER — SODIUM CHLORIDE 0.9 % IV SOLN: 250.0000 mL | INTRAVENOUS | Status: DC | PRN

## 2024-01-25 MED ORDER — LIDOCAINE HCL (PF) 1 % IJ SOLN
INTRAMUSCULAR | Status: DC | PRN
  Administered 2024-01-25: 15 mL

## 2024-01-25 MED ORDER — FENTANYL CITRATE (PF) 100 MCG/2ML IJ SOLN
INTRAMUSCULAR | Status: AC
  Filled 2024-01-25: qty 2

## 2024-01-25 MED ORDER — SODIUM CHLORIDE 0.9% FLUSH: 3.0000 mL | Freq: Two times a day (BID) | INTRAVENOUS | Status: DC

## 2024-01-25 MED ORDER — SODIUM CHLORIDE 0.9% FLUSH: 3.0000 mL | INTRAVENOUS | Status: DC | PRN

## 2024-01-25 MED ORDER — MIDAZOLAM HCL 2 MG/2ML IJ SOLN
INTRAMUSCULAR | Status: AC
  Filled 2024-01-25: qty 2

## 2024-01-25 MED ORDER — ONDANSETRON HCL 4 MG/2ML IJ SOLN: 4.0000 mg | Freq: Four times a day (QID) | INTRAMUSCULAR | Status: DC | PRN

## 2024-01-25 MED ORDER — LABETALOL HCL 5 MG/ML IV SOLN: 10.0000 mg | INTRAVENOUS | Status: DC | PRN

## 2024-01-25 MED ORDER — HEPARIN (PORCINE) IN NACL 2000-0.9 UNIT/L-% IV SOLN
INTRAVENOUS | Status: DC | PRN
  Administered 2024-01-25: 1000 mL

## 2024-01-25 MED ORDER — SODIUM CHLORIDE 0.9 % WEIGHT BASED INFUSION: 1.0000 mL/kg/h | INTRAVENOUS | Status: DC

## 2024-01-25 MED ORDER — SODIUM CHLORIDE 0.9 % IV SOLN: INTRAVENOUS | Status: DC

## 2024-01-25 MED ORDER — MIDAZOLAM HCL 2 MG/2ML IJ SOLN
INTRAMUSCULAR | Status: DC | PRN
  Administered 2024-01-25: 1 mg via INTRAVENOUS

## 2024-01-25 MED ORDER — FENTANYL CITRATE (PF) 100 MCG/2ML IJ SOLN
INTRAMUSCULAR | Status: DC | PRN
  Administered 2024-01-25: 25 ug via INTRAVENOUS

## 2024-01-25 MED ORDER — HYDRALAZINE HCL 20 MG/ML IJ SOLN: 5.0000 mg | INTRAMUSCULAR | Status: DC | PRN

## 2024-01-25 MED ORDER — ACETAMINOPHEN 325 MG PO TABS: 650.0000 mg | ORAL_TABLET | ORAL | Status: DC | PRN

## 2024-01-25 SURGICAL SUPPLY — 14 items
CATH OMNI FLUSH 5F 65CM (CATHETERS) IMPLANT
CLOSURE MYNX CONTROL 5F (Vascular Products) IMPLANT
GLIDEWIRE ADV .035X260CM (WIRE) IMPLANT
KIT MICROPUNCTURE NIT STIFF (SHEATH) IMPLANT
KIT PV (KITS) ×1 IMPLANT
KIT SINGLE USE MANIFOLD (KITS) IMPLANT
KIT SYRINGE INJ CVI SPIKEX1 (MISCELLANEOUS) IMPLANT
SET ATX-X65L (MISCELLANEOUS) IMPLANT
SHEATH PINNACLE 5F 10CM (SHEATH) IMPLANT
SHEATH PROBE COVER 6X72 (BAG) IMPLANT
SYR MEDRAD MARK 7 150ML (SYRINGE) ×1 IMPLANT
TRANSDUCER W/STOPCOCK (MISCELLANEOUS) ×1 IMPLANT
TRAY PV CATH (CUSTOM PROCEDURE TRAY) ×1 IMPLANT
WIRE BENTSON .035X145CM (WIRE) IMPLANT

## 2024-01-25 NOTE — Op Note (Addendum)
    Patient name: Paige Glass MRN: [id number] DOB: 11/17/1940 Sex: female  01/25/2024 Pre-operative Diagnosis: CL TI with rest pain Post-operative diagnosis:  Same Surgeon:  Norman GORMAN Serve, MD Procedure Performed:  Ultrasound-guided access of right common femoral artery Selection of aorta with catheter Third order selection of left common femoral artery Aortogram and bilateral lower extremity angiogram 23 minutes of moderate sedation with fentanyl  and Versed  Mynx closure of right common femoral artery   Indications: [name] is a 83 year old female with PAD and significant claudication.  She also has rest pain at night.  She is a current smoker but given her limb threatening disease and an angiogram was offered.  Risk and benefits were reviewed and she elected to proceed.  Findings:  Patent aorta and bilateral renal arteries.  Patent iliac systems bilaterally. Left common femoral and profunda are patent.  The SFA is chronically occluded at its origin.  There is reconstitution of the distal SFA.  The popliteal artery is diseased but patent.  There is significant tibial disease although two-vessel runoff via the peroneal and AT. Right common femoral and profunda are patent.  The SFA is chronically occluded at its origin.  There is reconstitution of the distal SFA.  The popliteal artery is diseased but patent.  There is significant tibial disease and one-vessel runoff via the AT.   Procedure:  The patient was identified in the holding area and taken to the cath lab  The patient was then placed supine on the table and prepped and draped in the usual sterile fashion.  A time out was called.  Ultrasound was used to evaluate the right common femoral artery.  It was patent .  A digital ultrasound image was acquired.  A micropuncture needle was used to access the right common femoral artery under ultrasound guidance.  An 018 wire was advanced without resistance and a micropuncture sheath was placed.   The 018 wire was removed and a benson wire was placed.  The micropuncture sheath was exchanged for a 5 french sheath.  An omniflush catheter was advanced over the wire to the level of L-1.  An abdominal angiogram was obtained.  Next, using the omniflush catheter and a glide advantage wire, the aortic bifurcation was crossed and the catheter was placed into theleft external iliac artery and left runoff was obtained. This demonstrated the above findings.  The catheter was removed over wire and right runoff was performed via retrograde sheath injections which demonstrated the above findings.  A minx was device was then deployed in the right common femoral with excellent hemostasis.  Contrast: 80 cc Sedation: 23 minutes  Impression: Bilateral chronic occlusions of the SFA.  Would require femoropopliteal bypass is bilaterally.   Norman GORMAN Serve MD [facility] Office: [phone]

## 2024-01-25 NOTE — H&P (Signed)
 Patient seen and examined.  No complaints. No changes to medication history or physical exam since last seen. After discussing the risks and benefits of Angiogram, Paige Glass elected to proceed.   Norman GORMAN Serve MD     _____________________________________________________________________      Patient ID: Paige Glass, female   DOB: November 14, 1940, 83 y.o.   MRN: [id number]   Reason for Consult: New Patient (Initial Visit)   Referred by Barton Drape, MD   Subjective:    Subjective HPI Paige Glass is a 83 y.o. female presenting for evaluation of leg pain.  She complains of significant calf pain with ambulation.  Has been happening for about the last 6 months.  She is a current smoker.  She denies nonhealing wounds although does have significant pain in her feet at night.  She describes the pain as burning, numbness, tingling but also throbbing which improves with dangling her foot off the bed.  She denies any previous medical history other than a left Achilles rupture.       Past Medical History:  Diagnosis Date   Back pain     Joint pain     Joint swelling     Nocturia     Osteoporosis               Family History  Problem Relation Age of Onset   Healthy Mother     Healthy Father               Past Surgical History:  Procedure Laterality Date   bilateral ear surgery       CATARACT EXTRACTION W/PHACO Right 08/20/2020    Procedure: CATARACT EXTRACTION PHACO AND INTRAOCULAR LENS PLACEMENT RIGHT EYE;  Surgeon: Harrie Agent, MD;  Location: [facility];  Service: Ophthalmology;  Laterality: Right;  right CDE=11.07   CATARACT EXTRACTION W/PHACO Left 09/03/2020    Procedure: CATARACT EXTRACTION PHACO AND INTRAOCULAR LENS PLACEMENT (IOC);  Surgeon: Harrie Agent, MD;  Location: [facility];  Service: Ophthalmology;  Laterality: Left;  CDE 19.15   OPEN REDUCTION INTERNAL FIXATION (ORIF) FOOT LISFRANC FRACTURE Left 07/14/2016    Procedure: OPEN REDUCTION INTERNAL FIXATION  (ORIF) FOOT LISFRANC FRACTURE;  Surgeon: Glendia Cordella Hutchinson, MD;  Location: [facility];  Service: Orthopedics;  Laterality: Left;   TRIGGER FINGER RELEASE Right            Short Social History:  Social History         Tobacco Use   Smoking status: Every Day      Current packs/day: 0.50      Average packs/day: 0.5 packs/day for 32.3 years (16.2 ttl pk-yrs)      Types: Cigarettes      Start date: 08/12/1991   Smokeless tobacco: Never  Substance Use Topics   Alcohol use: No      Allergies  No Known Allergies           Current Outpatient Medications  Medication Sig Dispense Refill   acetaminophen  (TYLENOL ) 500 MG tablet Take 500 mg by mouth at bedtime.       Biotin w/ Vitamins C & E (HAIR/SKIN/NAILS PO) Take 2 tablets by mouth daily.       CALCIUM PO Take 1,200 mg by mouth daily. 600 mg each       PROLIA  60 MG/ML SOSY injection Inject 60 mg into the skin every 6 (six) months.          No current facility-administered medications for this visit.  REVIEW OF SYSTEMS  All other systems were reviewed and are negative       Objective:    Objective[] Expand by Default    Vitals:    12/11/23 1517  BP: 128/78  Pulse: 63  Temp: 97.9 F (36.6 C)  SpO2: 92%  Weight: 106 lb (48.1 kg)  Height: 4' 11 (1.499 m)    Body mass index is 21.41 kg/m.   Physical Exam General: no acute distress Cardiac: hemodynamically stable Pulm: normal work of breathing Abdomen: non-tender, no pulsatile mass Neuro: alert, no focal deficit Extremities: no edema, cyanosis or wounds Vascular:              Right: Palpable femoral, nonpalpable pedal's             Left: Palpable femoral, nonpalpable pedal's   Data: ABI +---------+------------------+-----+----------+--------+  Right   Rt Pressure (mmHg)IndexWaveform  Comment   +---------+------------------+-----+----------+--------+  Brachial 135                    triphasic            +---------+------------------+-----+----------+--------+  PTA     90                0.61 monophasic          +---------+------------------+-----+----------+--------+  DP      106               0.72 monophasic          +---------+------------------+-----+----------+--------+  Great Toe63                0.43                     +---------+------------------+-----+----------+--------+   +---------+------------------+-----+----------+-------+  Left    Lt Pressure (mmHg)IndexWaveform  Comment  +---------+------------------+-----+----------+-------+  Brachial 148                    triphasic          +---------+------------------+-----+----------+-------+  PTA     98                0.66 monophasic         +---------+------------------+-----+----------+-------+  DP      91                0.61 monophasic         +---------+------------------+-----+----------+-------+  Great Toe49                0.33                    +---------+------------------+-----+----------+-------+       Assessment/Plan:    Paige Glass is a 83 y.o. female with PAD with CL TI with rest pain.  We discussed the risk factors and natural history of PAD.  I also explained the grading of PAD and discussed the patients with rest pain or nonhealing wounds or at a higher risk of amputation in the future.  We discussed angiogram in order to obtain further information about her perfusion and potentially intervene at that point. We discussed the risks and benefits of angiogram, her and her family elected to proceed.   Will plan to start with bilateral angiogram and left leg intervention from a right groin access   Paige Glass has atherosclerosis of the native arteries of the Bilateral lower extremities causing ischemic rest pain. The patient is on best medical therapy for peripheral arterial disease.  The patient has been counseled about the risks of tobacco use in atherosclerotic  disease. The patient has been counseled to abstain from any tobacco use. An aortogram with bilateral lower extremity runoff angiography and Bilateral lower extremity intervention and is indicated to better evaluate the patient's lower extremity circulation because of the chronic limb threatening nature of the patient's diagnosis. Based on the patient's clinical exam and non-invasive data, we anticipate an endovascular intervention in the femoropopliteal and tibial vessels. Stenting and/or athrectomy would be favored because of the improved primary patency of these interventions as compared to plain balloon angioplasty.   Encouraged to start 81 mg aspirin daily   Recommendations to optimize cardiovascular risk: Abstinence from all tobacco products. Blood glucose control with goal A1c < 7%. Blood pressure control with goal blood pressure < 140/90 mmHg. Lipid reduction therapy with goal LDL-C <100 mg/dL  Aspirin 81mg  PO QD.  Atorvastatin 40-80mg  PO QD (or other high intensity statin therapy).     Norman GORMAN Serve MD [facility]

## 2024-01-26 ENCOUNTER — Other Ambulatory Visit: Payer: Self-pay

## 2024-01-26 ENCOUNTER — Encounter (HOSPITAL_COMMUNITY): Payer: Self-pay | Admitting: Vascular Surgery

## 2024-01-29 DIAGNOSIS — M810 Age-related osteoporosis without current pathological fracture: Secondary | ICD-10-CM | POA: Diagnosis not present

## 2024-02-01 ENCOUNTER — Other Ambulatory Visit: Payer: Self-pay

## 2024-02-01 DIAGNOSIS — N186 End stage renal disease: Secondary | ICD-10-CM

## 2024-02-19 ENCOUNTER — Ambulatory Visit (HOSPITAL_COMMUNITY)

## 2024-02-19 ENCOUNTER — Ambulatory Visit (INDEPENDENT_AMBULATORY_CARE_PROVIDER_SITE_OTHER): Admitting: Vascular Surgery

## 2024-02-19 ENCOUNTER — Other Ambulatory Visit: Payer: Self-pay

## 2024-02-19 ENCOUNTER — Ambulatory Visit (HOSPITAL_COMMUNITY)
Admission: RE | Admit: 2024-02-19 | Discharge: 2024-02-19 | Disposition: A | Discharge status: Home / Self Care | Source: Ambulatory Visit | Attending: Vascular Surgery | Admitting: Vascular Surgery

## 2024-02-19 ENCOUNTER — Encounter: Payer: Self-pay | Admitting: Vascular Surgery

## 2024-02-19 VITALS — BP 128/66 | HR 60 | Temp 97.8°F | Resp 20 | Ht <= 58 in | Wt 105.5 lb

## 2024-02-19 DIAGNOSIS — N186 End stage renal disease: Secondary | ICD-10-CM | POA: Diagnosis not present

## 2024-02-19 DIAGNOSIS — I70222 Atherosclerosis of native arteries of extremities with rest pain, left leg: Secondary | ICD-10-CM | POA: Diagnosis not present

## 2024-02-19 DIAGNOSIS — I70221 Atherosclerosis of native arteries of extremities with rest pain, right leg: Secondary | ICD-10-CM | POA: Diagnosis not present

## 2024-02-19 NOTE — Progress Notes (Signed)
 Patient ID: Paige Glass, female   DOB: [birth year], 83 y.o.   MRN: [id number]  Reason for Consult: Follow-up   Referred by Paige Norleen PEDLAR, MD  Subjective:     HPI Paige Glass is a 83 y.o. female with CL TI and rest pain.  She underwent bilateral lower extremity angiogram on 01/25/2024.  That demonstrated bilateral flush occlusions of the SFAs and explained would require surgical bypass.  She presents today for discussion. Today she reports that she continues to have bilateral lower extremity claudication and rest pain.  Right greater than left.  She denies slow or nonhealing wounds.  She continues to smoke and is down to about a third of a pack per day  Past Medical History:  Diagnosis Date   Back pain    Hypertension    Joint pain    Joint swelling    Nocturia    Osteoporosis    Family History  Problem Relation Age of Onset   Healthy Mother    Healthy Father    Past Surgical History:  Procedure Laterality Date   ABDOMINAL AORTOGRAM W/LOWER EXTREMITY N/A 01/25/2024   Procedure: ABDOMINAL AORTOGRAM W/LOWER EXTREMITY;  Surgeon: Pearline Norman RAMAN, MD;  Location: [facility] INVASIVE CV LAB;  Service: Cardiovascular;  Laterality: N/A;   bilateral ear surgery     CATARACT EXTRACTION W/PHACO Right 08/20/2020   Procedure: CATARACT EXTRACTION PHACO AND INTRAOCULAR LENS PLACEMENT RIGHT EYE;  Surgeon: Harrie Agent, MD;  Location: [facility];  Service: Ophthalmology;  Laterality: Right;  right CDE=11.07   CATARACT EXTRACTION W/PHACO Left 09/03/2020   Procedure: CATARACT EXTRACTION PHACO AND INTRAOCULAR LENS PLACEMENT (IOC);  Surgeon: Harrie Agent, MD;  Location: [facility];  Service: Ophthalmology;  Laterality: Left;  CDE 19.15   LOWER EXTREMITY ANGIOGRAPHY N/A 01/25/2024   Procedure: Lower Extremity Angiography;  Surgeon: Pearline Norman RAMAN, MD;  Location: [facility] INVASIVE CV LAB;  Service: Cardiovascular;  Laterality: N/A;   LOWER EXTREMITY INTERVENTION N/A 01/25/2024   Procedure: LOWER EXTREMITY  INTERVENTION;  Surgeon: Pearline Norman RAMAN, MD;  Location: [facility] INVASIVE CV LAB;  Service: Cardiovascular;  Laterality: N/A;   OPEN REDUCTION INTERNAL FIXATION (ORIF) FOOT LISFRANC FRACTURE Left 07/14/2016   Procedure: OPEN REDUCTION INTERNAL FIXATION (ORIF) FOOT LISFRANC FRACTURE;  Surgeon: Glendia Cordella Hutchinson, MD;  Location: [facility];  Service: Orthopedics;  Laterality: Left;   TRIGGER FINGER RELEASE Right     Short Social History:  Social History   Tobacco Use   Smoking status: Every Day    Current packs/day: 0.50    Average packs/day: 0.5 packs/day for 32.5 years (16.3 ttl pk-yrs)    Types: Cigarettes    Start date: 08/12/1991   Smokeless tobacco: Never   Tobacco comments:    Patient is cutting back on her smoking  Substance Use Topics   Alcohol use: No    No Known Allergies  Current Outpatient Medications  Medication Sig Dispense Refill   acetaminophen  (TYLENOL ) 650 MG CR tablet Take 650 mg by mouth at bedtime.     aspirin EC 81 MG tablet Take 81 mg by mouth daily. Swallow whole.     atorvastatin (LIPITOR) 20 MG tablet Take 20 mg by mouth daily.     CALCIUM PO Take 1,200 mg by mouth daily. 600 mg each     denosumab -bbdz (JUBBONTI) 60 MG/ML SOSY Inject 60 mg into the skin every 6 (six) months.     MAGNESIUM PO Take 1 capsule by mouth at bedtime.  Multiple Vitamin (MULTIVITAMIN WITH MINERALS) TABS tablet Take 1 tablet by mouth daily.     No current facility-administered medications for this visit.    REVIEW OF SYSTEMS  All other systems were reviewed and are negative     Objective:  Objective   Vitals:   02/19/24 1414  BP: 128/66  Pulse: 60  Resp: 20  Temp: 97.8 F (36.6 C)  TempSrc: Temporal  SpO2: 97%  Weight: 105 lb 8 oz (47.9 kg)  Height: 4' 8 (1.422 m)   Body mass index is 23.65 kg/m.  Physical Exam General: no acute distress Cardiac: hemodynamically stable Extremities: no edema, cyanosis or wounds   Data: Reviewed angiogram from  10/13  ABI +---------+------------------+-----+----------+--------+  Right   Rt Pressure (mmHg)IndexWaveform  Comment   +---------+------------------+-----+----------+--------+  Brachial 135                    triphasic           +---------+------------------+-----+----------+--------+  PTA     90                0.61 monophasic          +---------+------------------+-----+----------+--------+  DP      106               0.72 monophasic          +---------+------------------+-----+----------+--------+  Great Toe63                0.43                     +---------+------------------+-----+----------+--------+   +---------+------------------+-----+----------+-------+  Left    Lt Pressure (mmHg)IndexWaveform  Comment  +---------+------------------+-----+----------+-------+  Brachial 148                    triphasic          +---------+------------------+-----+----------+-------+  PTA     98                0.66 monophasic         +---------+------------------+-----+----------+-------+  DP      91                0.61 monophasic         +---------+------------------+-----+----------+-------+  Great Toe49                0.33                    +---------+------------------+-----+----------+-------+   Vein mapping +----------------+-----------+--------------+----------------+-----------+  RT Diameter (cm)RT Findings     GSV      LT Diameter (cm)LT Findings  +----------------+-----------+--------------+----------------+-----------+       0.29                 Proximal thigh      0.41                   +----------------+-----------+--------------+----------------+-----------+       0.20                   Mid thigh         0.32                   +----------------+-----------+--------------+----------------+-----------+       0.14                  Distal thigh       0.32  branching    +----------------+-----------+--------------+----------------+-----------+       0.20                      Knee           0.17                   +----------------+-----------+--------------+----------------+-----------+       0.18       branching   Prox calf         0.18                   +----------------+-----------+--------------+----------------+-----------+       0.20       branching    Mid calf         0.23                   +----------------+-----------+--------------+----------------+-----------+       0.27       branching  Distal calf        0.20                   +----------------+-----------+--------------+----------------+-----------+       0.21                     Ankle           0.19                   +----------------+-----------+--------------+----------------+-----------+       Assessment/Plan:   Heron PARAS Dandy is a 83 y.o. female with CL TI and rest pain.  Right greater than left.  We again reviewed the results of her angiogram that was done in October and explained that she would require femoral-popliteal artery bypass bilaterally.  Since she is having worse pain on the right we discussed proceeding with right side first. Based on her angiogram I do think it would be a right femoral to above-knee popliteal artery bypass with PTFE. We reviewed the risks and benefits and I explained that smoking increases her risk of infection and decreases patency rate.  Strongly encouraged smoking cessation.  Plan for right femoral to above-knee popliteal artery bypass with PTFE Will need cardiac clearance  Recommendations to optimize cardiovascular risk: Abstinence from all tobacco products. Blood glucose control with goal A1c < 7%. Blood pressure control with goal blood pressure < 140/90 mmHg. Lipid reduction therapy with goal LDL-C <55 mg/dL  Aspirin 81mg  PO QD.  Atorvastatin 40-80mg  PO QD (or other high intensity statin therapy).   Norman GORMAN Serve MD [facility]

## 2024-02-24 ENCOUNTER — Other Ambulatory Visit (HOSPITAL_COMMUNITY): Payer: Self-pay | Admitting: Internal Medicine

## 2024-02-24 DIAGNOSIS — Z1231 Encounter for screening mammogram for malignant neoplasm of breast: Secondary | ICD-10-CM

## 2024-02-25 DIAGNOSIS — M1812 Unilateral primary osteoarthritis of first carpometacarpal joint, left hand: Secondary | ICD-10-CM | POA: Diagnosis not present

## 2024-02-25 DIAGNOSIS — M19012 Primary osteoarthritis, left shoulder: Secondary | ICD-10-CM | POA: Diagnosis not present

## 2024-03-16 ENCOUNTER — Encounter: Payer: Self-pay | Admitting: Internal Medicine

## 2024-03-16 ENCOUNTER — Ambulatory Visit (HOSPITAL_COMMUNITY)
Admission: RE | Admit: 2024-03-16 | Discharge: 2024-03-16 | Disposition: A | Source: Ambulatory Visit | Attending: Internal Medicine | Admitting: Internal Medicine

## 2024-03-16 DIAGNOSIS — Z1231 Encounter for screening mammogram for malignant neoplasm of breast: Secondary | ICD-10-CM | POA: Diagnosis not present

## 2024-03-17 ENCOUNTER — Encounter: Payer: Self-pay | Admitting: Cardiology

## 2024-03-17 ENCOUNTER — Ambulatory Visit: Attending: Cardiology | Admitting: Cardiology

## 2024-03-17 VITALS — BP 118/66 | HR 66 | Ht <= 58 in | Wt 106.4 lb

## 2024-03-17 DIAGNOSIS — Z720 Tobacco use: Secondary | ICD-10-CM | POA: Diagnosis not present

## 2024-03-17 DIAGNOSIS — Z01818 Encounter for other preprocedural examination: Secondary | ICD-10-CM

## 2024-03-17 DIAGNOSIS — E785 Hyperlipidemia, unspecified: Secondary | ICD-10-CM

## 2024-03-17 DIAGNOSIS — M79606 Pain in leg, unspecified: Secondary | ICD-10-CM

## 2024-03-17 DIAGNOSIS — I70222 Atherosclerosis of native arteries of extremities with rest pain, left leg: Secondary | ICD-10-CM | POA: Diagnosis not present

## 2024-03-17 NOTE — Progress Notes (Signed)
 Cardiology Office Note:    Date:  03/17/2024   ID:  Paige Glass, DOB [birth year], MRN [id number]  PCP:  Paige Norleen PEDLAR, MD  Cardiologist:  None  Electrophysiologist:  None   Referring MD: Pearline Norman RAMAN, MD   Chief Complaint  Patient presents with   Pre-op Exam    History of Present Illness:    Paige Glass is a 83 y.o. female with a hx of PAD, tobacco use, hypertension who is referred by Dr. Pearline for preop evaluation.  Follows with Dr. Pearline in vascular surgery.  ABIs 12/11/2023 were moderately reduced bilaterally (0.72 on right, 0.66 on left).  Underwent angiogram with Dr. Pearline 01/2024 which showed bilateral occlusions of SFA.  Recommendation was for right femoral to above-knee popliteal artery bypass.  Referred for preop evaluation prior to surgery.  Denies any chest pain, dyspnea, lightheadedness, syncope, or palpitations.  She reports she walks up stairs in her home (13 steps) multiple times per day.  She denies any exertional chest pain or dyspnea, but does report she gets leg pain with walking.  Does report occasional swelling in her legs.  She has smoked for 45 years, up to 1 pack/day, currently down to 6 cigarettes/day.  Family history clued mother died of MI in 75s.   Past Medical History:  Diagnosis Date   Back pain    Hypertension    Joint pain    Joint swelling    Nocturia    Osteoporosis     Past Surgical History:  Procedure Laterality Date   ABDOMINAL AORTOGRAM W/LOWER EXTREMITY N/A 01/25/2024   Procedure: ABDOMINAL AORTOGRAM W/LOWER EXTREMITY;  Surgeon: Pearline Norman RAMAN, MD;  Location: [facility] INVASIVE CV LAB;  Service: Cardiovascular;  Laterality: N/A;   bilateral ear surgery     CATARACT EXTRACTION W/PHACO Right 08/20/2020   Procedure: CATARACT EXTRACTION PHACO AND INTRAOCULAR LENS PLACEMENT RIGHT EYE;  Surgeon: Harrie Agent, MD;  Location: [facility] ORS;  Service: Ophthalmology;  Laterality: Right;  right CDE=11.07   CATARACT EXTRACTION W/PHACO Left 09/03/2020    Procedure: CATARACT EXTRACTION PHACO AND INTRAOCULAR LENS PLACEMENT (IOC);  Surgeon: Harrie Agent, MD;  Location: [facility];  Service: Ophthalmology;  Laterality: Left;  CDE 19.15   LOWER EXTREMITY ANGIOGRAPHY N/A 01/25/2024   Procedure: Lower Extremity Angiography;  Surgeon: Pearline Norman RAMAN, MD;  Location: [facility] INVASIVE CV LAB;  Service: Cardiovascular;  Laterality: N/A;   LOWER EXTREMITY INTERVENTION N/A 01/25/2024   Procedure: LOWER EXTREMITY INTERVENTION;  Surgeon: Pearline Norman RAMAN, MD;  Location: [facility] INVASIVE CV LAB;  Service: Cardiovascular;  Laterality: N/A;   OPEN REDUCTION INTERNAL FIXATION (ORIF) FOOT LISFRANC FRACTURE Left 07/14/2016   Procedure: OPEN REDUCTION INTERNAL FIXATION (ORIF) FOOT LISFRANC FRACTURE;  Surgeon: Glendia Cordella Hutchinson, MD;  Location: [facility];  Service: Orthopedics;  Laterality: Left;   TRIGGER FINGER RELEASE Right     Current Medications: Current Meds  Medication Sig   acetaminophen  (TYLENOL ) 650 MG CR tablet Take 650 mg by mouth at bedtime.   aspirin  EC 81 MG tablet Take 81 mg by mouth daily. Swallow whole.   atorvastatin (LIPITOR) 20 MG tablet Take 20 mg by mouth daily.   CALCIUM PO Take 1,200 mg by mouth daily. 600 mg each   denosumab -bbdz (JUBBONTI) 60 MG/ML SOSY Inject 60 mg into the skin every 6 (six) months.   MAGNESIUM PO Take 1 capsule by mouth at bedtime.   Multiple Vitamin (MULTIVITAMIN WITH MINERALS) TABS tablet Take 1 tablet by mouth daily.  Allergies:   Patient has no known allergies.   Social History   Socioeconomic History   Marital status: Married    Spouse name: Not on file   Number of children: Not on file   Years of education: Not on file   Highest education level: Not on file  Occupational History   Not on file  Tobacco Use   Smoking status: Every Day    Current packs/day: 0.50    Average packs/day: 0.5 packs/day for 32.6 years (16.3 ttl pk-yrs)    Types: Cigarettes    Start date: 08/12/1991   Smokeless tobacco: Never    Tobacco comments:    03/17/2024 Patient states she smokes about 5 cigarettes daily    Patient is cutting back on her smoking  Vaping Use   Vaping status: Never Used  Substance and Sexual Activity   Alcohol use: No   Drug use: No   Sexual activity: Not on file  Other Topics Concern   Not on file  Social History Narrative   Not on file   Social Drivers of Health   Financial Resource Strain: Not on file  Food Insecurity: Not on file  Transportation Needs: Not on file  Physical Activity: Not on file  Stress: Not on file  Social Connections: Not on file     Family History: The patient's family history includes Healthy in her father and mother.  ROS:   Please see the history of present illness.     All other systems reviewed and are negative.  EKGs/Labs/Other Studies Reviewed:    The following studies were reviewed today:   EKG:   03/17/2024: Right bundle branch block, sinus rhythm, rate 66  Recent Labs: 01/25/2024: BUN 16; Creatinine, Ser 1.20; Hemoglobin 15.0; Potassium 4.0; Sodium 142  Recent Lipid Panel No results found for: CHOL, TRIG, HDL, CHOLHDL, VLDL, LDLCALC, LDLDIRECT  Physical Exam:    VS:  BP 118/66 (BP Location: Left Arm, Patient Position: Sitting, Cuff Size: Small)   Pulse 66   Ht 4' 7 (1.397 m)   Wt 106 lb 6.6 oz (48.3 kg)   SpO2 97%   BMI 24.73 kg/m     Wt Readings from Last 3 Encounters:  03/17/24 106 lb 6.6 oz (48.3 kg)  02/19/24 105 lb 8 oz (47.9 kg)  01/25/24 104 lb (47.2 kg)     GEN:  Well nourished, well developed in no acute distress HEENT: Normal NECK: No JVD; No carotid bruits LYMPHATICS: No lymphadenopathy CARDIAC: RRR, no murmurs, rubs, gallops RESPIRATORY:  Clear to auscultation without rales, wheezing or rhonchi  ABDOMEN: Soft, non-tender, non-distended MUSCULOSKELETAL:  No edema; No deformity  SKIN: Warm and dry NEUROLOGIC:  Alert and oriented x 3 PSYCHIATRIC:  Normal affect   ASSESSMENT:    1. Pre-op  evaluation   2. Atherosclerosis of native artery of left lower extremity with rest pain (HCC)   3. Pain of lower extremity, unspecified laterality   4. Hyperlipidemia, unspecified hyperlipidemia type   5. Tobacco use    PLAN:    Preop evaluation: Prior to right femoral to above-knee popliteal artery bypass.  Denies any anginal symptoms.  Functional capacity greater than 4 METS.  No further cardiac workup recommended prior to procedure  PAD: Follows with Dr. Pearline in vascular surgery.  ABIs 12/11/2023 were moderately reduced bilaterally (0.72 on right, 0.66 on left).  Underwent angiogram with Dr. Pearline 01/2024 which showed bilateral occlusions of SFA.   - Planning right femoral to above-knee popliteal artery bypass -  Continue aspirin , statin  Hyperlipidemia: LDL 114 on 12/31/2023, she was started on atorvastatin 20 mg daily at that time.  Will recheck lipid panel.  Goal LDL less than 70  Tobacco use: Counseled on the risk of tobacco use and cessation strongly encouraged  RTC in 6 months  Medication Adjustments/Labs and Tests Ordered: Current medicines are reviewed at length with the patient today.  Concerns regarding medicines are outlined above.  Orders Placed This Encounter  Procedures   Lipid panel   EKG 12-Lead   No orders of the defined types were placed in this encounter.   Patient Instructions  Medication Instructions:  No changes *If you need a refill on your cardiac medications before your next appointment, please call your pharmacy*  Lab Work: Lipid panel If you have labs (blood work) drawn today and your tests are completely normal, you will receive your results only by: MyChart Message (if you have MyChart) OR A paper copy in the mail If you have any lab test that is abnormal or we need to change your treatment, we will call you to review the results.  Testing/Procedures: None ordered  Follow-Up: At [facility], you and your health needs are our  priority.  As part of our continuing mission to provide you with exceptional heart care, our providers are all part of one team.  This team includes your primary Cardiologist (physician) and Advanced Practice Providers or APPs (Physician Assistants and Nurse Practitioners) who all work together to provide you with the care you need, when you need it.  Your next appointment:   6 month(s)  Provider:   Dr Kate  We recommend signing up for the patient portal called MyChart.  Sign up information is provided on this After Visit Summary.  MyChart is used to connect with patients for Virtual Visits (Telemedicine).  Patients are able to view lab/test results, encounter notes, upcoming appointments, etc.  Non-urgent messages can be sent to your provider as well.   To learn more about what you can do with MyChart, go to [email or web address].        Signed, Lonni LITTIE Kate, MD  03/17/2024 4:10 PM    [facility]

## 2024-03-17 NOTE — Patient Instructions (Signed)
 Medication Instructions:  No changes *If you need a refill on your cardiac medications before your next appointment, please call your pharmacy*  Lab Work: Lipid panel If you have labs (blood work) drawn today and your tests are completely normal, you will receive your results only by: MyChart Message (if you have MyChart) OR A paper copy in the mail If you have any lab test that is abnormal or we need to change your treatment, we will call you to review the results.  Testing/Procedures: None ordered  Follow-Up: At [facility], you and your health needs are our priority.  As part of our continuing mission to provide you with exceptional heart care, our providers are all part of one team.  This team includes your primary Cardiologist (physician) and Advanced Practice Providers or APPs (Physician Assistants and Nurse Practitioners) who all work together to provide you with the care you need, when you need it.  Your next appointment:   6 month(s)  Provider:   Dr Kate  We recommend signing up for the patient portal called MyChart.  Sign up information is provided on this After Visit Summary.  MyChart is used to connect with patients for Virtual Visits (Telemedicine).  Patients are able to view lab/test results, encounter notes, upcoming appointments, etc.  Non-urgent messages can be sent to your provider as well.   To learn more about what you can do with MyChart, go to [email or web address].

## 2024-03-18 ENCOUNTER — Ambulatory Visit: Payer: Self-pay | Admitting: Cardiology

## 2024-03-18 LAB — LIPID PANEL
Chol/HDL Ratio: 2.2 ratio (ref 0.0–4.4)
Cholesterol, Total: 152 mg/dL (ref 100–199)
HDL: 69 mg/dL (ref >39)
LDL Chol Calc (NIH): 66 mg/dL (ref 0–99)
Triglycerides: 90 mg/dL (ref 0–149)
VLDL Cholesterol Cal: 17 mg/dL (ref 5–40)

## 2024-03-22 ENCOUNTER — Other Ambulatory Visit: Payer: Self-pay

## 2024-03-22 DIAGNOSIS — I70221 Atherosclerosis of native arteries of extremities with rest pain, right leg: Secondary | ICD-10-CM

## 2024-04-29 ENCOUNTER — Encounter (HOSPITAL_COMMUNITY): Payer: Self-pay

## 2024-04-29 NOTE — Pre-Procedure Instructions (Signed)
 Surgical Instructions   Your procedure is scheduled on May 05, 2024. Report to [facility] Main Entrance A at 5:30 A.M., then check in with the Admitting office. Any questions or running late day of surgery: call [phone]  Questions prior to your surgery date: call [phone], Monday-Friday, 8am-4pm. If you experience any cold or flu symptoms such as cough, fever, chills, shortness of breath, etc. between now and your scheduled surgery, please notify us  at the above number.     Remember:  Do not eat or drink after midnight the night before your surgery    Take these medicines the morning of surgery with A SIP OF WATER : aspirin  EC  atorvastatin (LIPITOR)    May take these medicines IF NEEDED: acetaminophen  (TYLENOL )    One week prior to surgery, STOP taking any Aleve, Naproxen, Ibuprofen, Motrin, Advil, Goody's, BC's, all herbal medications, fish oil, and non-prescription vitamins.                     Do NOT Smoke (Tobacco/Vaping) for 24 hours prior to your procedure.  If you use a CPAP at night, you may bring your mask/headgear for your overnight stay.   You will be asked to remove any contacts, glasses, piercing's, hearing aid's, dentures/partials prior to surgery. Please bring cases for these items if needed.    Your surgeon will determine if you are to be admitted or discharged the same day.  Patients discharged the day of surgery will not be allowed to drive home, and someone needs to stay with them for 24 hours.  SURGICAL WAITING ROOM VISITATION Patients may have no more than 2 support people in the waiting area - these visitors may rotate.   Pre-op nurse will coordinate an appropriate time for 2 ADULT support persons, who may not rotate, to accompany patient in pre-op.  Children under the age of 17 must have an adult with them who is not the patient and must remain in the main waiting area with an adult.  If the patient needs to stay at the hospital during  part of their recovery, the visitor guidelines for inpatient rooms apply.  Please refer to the [facility] website for the visitor guidelines for any additional information.   If you received a COVID test during your pre-op visit  it is requested that you wear a mask when out in public, stay away from anyone that may not be feeling well and notify your surgeon if you develop symptoms. If you have been in contact with anyone that has tested positive in the last 10 days please notify you surgeon.      Pre-operative CHG Bathing Instructions   You can play a key role in reducing the risk of infection after surgery. Your skin needs to be as free of germs as possible. You can reduce the number of germs on your skin by washing with CHG (chlorhexidine  gluconate) soap before surgery. CHG is an antiseptic soap that kills germs and continues to kill germs even after washing.   DO NOT use if you have an allergy to chlorhexidine /CHG or antibacterial soaps. If your skin becomes reddened or irritated, stop using the CHG and notify one of our RNs at [phone].              TAKE A SHOWER THE NIGHT BEFORE SURGERY   Please keep in mind the following:  DO NOT shave, including legs and underarms, 48 hours prior to surgery.   You may shave  your face before/day of surgery.  Place clean sheets on your bed the night before surgery Use a clean washcloth (not used since being washed) for shower. DO NOT sleep with pet's night before surgery.  CHG Shower Instructions:  Wash your face and private area with normal soap. If you choose to wash your hair, wash first with your normal shampoo.  After you use shampoo/soap, rinse your hair and body thoroughly to remove shampoo/soap residue.  Turn the water  OFF and apply half the bottle of CHG soap to a CLEAN washcloth.  Apply CHG soap ONLY FROM YOUR NECK DOWN TO YOUR TOES (washing for 3-5 minutes)  DO NOT use CHG soap on face, private areas, open wounds, or sores.  Pay  special attention to the area where your surgery is being performed.  If you are having back surgery, having someone wash your back for you may be helpful. Wait 2 minutes after CHG soap is applied, then you may rinse off the CHG soap.  Pat dry with a clean towel  Put on clean pajamas    Additional instructions for the day of surgery: If you choose, you may shower the morning of surgery with an antibacterial soap.  DO NOT APPLY any lotions, deodorants, cologne, or perfumes.   Do not wear jewelry or makeup Do not wear nail polish, gel polish, artificial nails, or any other type of covering on natural nails (fingers and toes) Do not bring valuables to the hospital. [facility] is not responsible for valuables/personal belongings. Put on clean/comfortable clothes.  Please brush your teeth.  Ask your nurse before applying any prescription medications to the skin.

## 2024-05-02 ENCOUNTER — Encounter (HOSPITAL_COMMUNITY)
Admission: RE | Admit: 2024-05-02 | Discharge: 2024-05-02 | Disposition: A | Source: Ambulatory Visit | Attending: Vascular Surgery

## 2024-05-02 ENCOUNTER — Encounter (HOSPITAL_COMMUNITY): Payer: Self-pay

## 2024-05-02 ENCOUNTER — Other Ambulatory Visit: Payer: Self-pay

## 2024-05-02 VITALS — BP 113/63 | HR 65 | Temp 98.1°F | Resp 17 | Ht <= 58 in | Wt 105.4 lb

## 2024-05-02 DIAGNOSIS — M810 Age-related osteoporosis without current pathological fracture: Secondary | ICD-10-CM | POA: Insufficient documentation

## 2024-05-02 DIAGNOSIS — F17210 Nicotine dependence, cigarettes, uncomplicated: Secondary | ICD-10-CM | POA: Insufficient documentation

## 2024-05-02 DIAGNOSIS — Z01812 Encounter for preprocedural laboratory examination: Secondary | ICD-10-CM | POA: Insufficient documentation

## 2024-05-02 DIAGNOSIS — Z01818 Encounter for other preprocedural examination: Secondary | ICD-10-CM

## 2024-05-02 DIAGNOSIS — I70221 Atherosclerosis of native arteries of extremities with rest pain, right leg: Secondary | ICD-10-CM | POA: Insufficient documentation

## 2024-05-02 DIAGNOSIS — I10 Essential (primary) hypertension: Secondary | ICD-10-CM | POA: Insufficient documentation

## 2024-05-02 HISTORY — DX: Peripheral vascular disease, unspecified: I73.9

## 2024-05-02 LAB — PROTIME-INR
INR: 0.9 (ref 0.8–1.2)
Prothrombin Time: 13 s (ref 11.4–15.2)

## 2024-05-02 LAB — CBC
HCT: 44.1 % (ref 36.0–46.0)
Hemoglobin: 14.8 g/dL (ref 12.0–15.0)
MCH: 33 pg (ref 26.0–34.0)
MCHC: 33.6 g/dL (ref 30.0–36.0)
MCV: 98.4 fL (ref 80.0–100.0)
Platelets: 220 K/uL (ref 150–400)
RBC: 4.48 MIL/uL (ref 3.87–5.11)
RDW: 11.5 % (ref 11.5–15.5)
WBC: 7.3 K/uL (ref 4.0–10.5)
nRBC: 0 % (ref 0.0–0.2)

## 2024-05-02 LAB — COMPREHENSIVE METABOLIC PANEL WITH GFR
ALT: 12 U/L (ref 0–44)
AST: 24 U/L (ref 15–41)
Albumin: 4.1 g/dL (ref 3.5–5.0)
Alkaline Phosphatase: 66 U/L (ref 38–126)
Anion gap: 9 (ref 5–15)
BUN: 17 mg/dL (ref 8–23)
CO2: 29 mmol/L (ref 22–32)
Calcium: 9.6 mg/dL (ref 8.9–10.3)
Chloride: 103 mmol/L (ref 98–111)
Creatinine, Ser: 1.04 mg/dL — ABNORMAL HIGH (ref 0.44–1.00)
GFR, Estimated: 53 mL/min — ABNORMAL LOW
Glucose, Bld: 79 mg/dL (ref 70–99)
Potassium: 4.8 mmol/L (ref 3.5–5.1)
Sodium: 141 mmol/L (ref 135–145)
Total Bilirubin: 0.3 mg/dL (ref 0.0–1.2)
Total Protein: 6.8 g/dL (ref 6.5–8.1)

## 2024-05-02 LAB — URINALYSIS, ROUTINE W REFLEX MICROSCOPIC
Bilirubin Urine: NEGATIVE
Glucose, UA: NEGATIVE mg/dL
Hgb urine dipstick: NEGATIVE
Ketones, ur: NEGATIVE mg/dL
Nitrite: NEGATIVE
Protein, ur: NEGATIVE mg/dL
Specific Gravity, Urine: 1.004 — ABNORMAL LOW (ref 1.005–1.030)
pH: 7 (ref 5.0–8.0)

## 2024-05-02 LAB — TYPE AND SCREEN
ABO/RH(D): A POS
Antibody Screen: NEGATIVE

## 2024-05-02 LAB — SURGICAL PCR SCREEN
MRSA, PCR: NEGATIVE
Staphylococcus aureus: NEGATIVE

## 2024-05-02 LAB — APTT: aPTT: 29 s (ref 24–36)

## 2024-05-02 NOTE — Progress Notes (Signed)
 Alan Glance, RN, OR scheduler for Dr. Pearline notified of abnormal UA results.

## 2024-05-02 NOTE — Progress Notes (Signed)
 PCP - Dr. Norleen Hurst Cardiologist - Lonni Nanas (received cardiac clearance 03/17/24)  PPM/ICD - Denies   Chest x-ray - denies EKG - 03/17/2024 Stress Test - denies ECHO - denies Cardiac Cath - denies  Sleep Study - denies  No DM.  Last dose of GLP1 agonist-  NA  Blood Thinner Instructions: N/A Aspirin  Instructions: pt instructed to continue through morning of surgery  ERAS Protcol - NPO  COVID TEST- N/A   Anesthesia review: yes, cardiac clearance  Patient denies shortness of breath, fever, cough and chest pain at PAT appointment. Denies any respiratory symptoms in the last two weeks.    All instructions explained to the patient, with a verbal understanding of the material. Patient agrees to go over the instructions while at home for a better understanding. The opportunity to ask questions was provided.

## 2024-05-03 NOTE — Progress Notes (Signed)
 Anesthesia Chart Review:  Case: [id number] Date/Time: 05/05/24 0715   Procedure: BYPASS GRAFT FEMORAL-POPLITEAL ARTERY (Right)   Anesthesia type: General   Diagnosis: Critical limb ischemia of right lower extremity (HCC) [I70.221]   Pre-op diagnosis: CLI RLE   [facility] ROOM [facility] / [facility] OR   Surgeons: Pearline Norman RAMAN, MD       DISCUSSION: Patient is an 84 year old female scheduled for the above procedure.  History includes smoking, HTN, PAD, osteoporosis.  Dr. Pearline referred her to cardiologist Dr. Kate for preoperative cardiology evaluation.  She was seen on 03/17/2024. Denies any chest pain, dyspnea, lightheadedness, syncope, or palpitations.  She reports she walks up stairs in her home (13 steps) multiple times per day.  She denies any exertional chest pain or dyspnea, but does report she gets leg pain with walking.  Does report occasional swelling in her legs.  She has smoked for 45 years, up to 1 pack/day, currently down to 6 cigarettes/day.  Family history clued mother died of MI in 27s. EKG showed SR, right BBB. Given functional capacity of > 4 METS, he wrote, No further cardiac workup recommended prior to procedure.  Anesthesia team to evaluate on the day of surgery.   VS: BP 113/63   Pulse 65   Temp 36.7 C   Resp 17   Ht 4' 8 (1.422 m)   Wt 47.8 kg   SpO2 100%   BMI 23.63 kg/m   PROVIDERS: [name], MD is PCP  Kate Bruckner, MD is cardiologist   LABS: Labs reviewed: Acceptable for surgery. (all labs ordered are listed, but only abnormal results are displayed)  Labs Reviewed  COMPREHENSIVE METABOLIC PANEL WITH GFR - Abnormal; Notable for the following components:      Result Value   Creatinine, Ser 1.04 (*)    GFR, Estimated 53 (*)    All other components within normal limits  URINALYSIS, ROUTINE W REFLEX MICROSCOPIC - Abnormal; Notable for the following components:   Color, Urine STRAW (*)    Specific Gravity, Urine 1.004 (*)     Leukocytes,Ua TRACE (*)    Bacteria, UA RARE (*)    All other components within normal limits  SURGICAL PCR SCREEN  CBC  PROTIME-INR  APTT  TYPE AND SCREEN     EKG: 03/17/2024: Normal sinus rhythm Right bundle branch block When compared with ECG of 25-Jan-2024 06:12, Premature atrial complexes are no longer Present Confirmed by Kate Bruckner [phone]) on 03/17/2024 3:47:14 PM  CV: Aortogram with BLE runoff 01/25/2024: Impression: Bilateral chronic occlusions of the SFA.  Would require femoropopliteal bypass is bilaterally.               Past Medical History:  Diagnosis Date   Back pain    Hypertension    Joint pain    Joint swelling    Nocturia    Osteoporosis    Peripheral vascular disease     Past Surgical History:  Procedure Laterality Date   ABDOMINAL AORTOGRAM W/LOWER EXTREMITY N/A 01/25/2024   Procedure: ABDOMINAL AORTOGRAM W/LOWER EXTREMITY;  Surgeon: Pearline Norman RAMAN, MD;  Location: [facility] INVASIVE CV LAB;  Service: Cardiovascular;  Laterality: N/A;   bilateral ear surgery     CATARACT EXTRACTION W/PHACO Right 08/20/2020   Procedure: CATARACT EXTRACTION PHACO AND INTRAOCULAR LENS PLACEMENT RIGHT EYE;  Surgeon: Harrie Agent, MD;  Location: [facility] ORS;  Service: Ophthalmology;  Laterality: Right;  right CDE=11.07   CATARACT EXTRACTION W/PHACO Left 09/03/2020   Procedure: CATARACT EXTRACTION  PHACO AND INTRAOCULAR LENS PLACEMENT (IOC);  Surgeon: Harrie Agent, MD;  Location: [facility];  Service: Ophthalmology;  Laterality: Left;  CDE 19.15   LOWER EXTREMITY ANGIOGRAPHY N/A 01/25/2024   Procedure: Lower Extremity Angiography;  Surgeon: Pearline Norman RAMAN, MD;  Location: [facility] INVASIVE CV LAB;  Service: Cardiovascular;  Laterality: N/A;   LOWER EXTREMITY INTERVENTION N/A 01/25/2024   Procedure: LOWER EXTREMITY INTERVENTION;  Surgeon: Pearline Norman RAMAN, MD;  Location: [facility] INVASIVE CV LAB;  Service: Cardiovascular;  Laterality: N/A;   OPEN REDUCTION INTERNAL FIXATION (ORIF) FOOT  LISFRANC FRACTURE Left 07/14/2016   Procedure: OPEN REDUCTION INTERNAL FIXATION (ORIF) FOOT LISFRANC FRACTURE;  Surgeon: Glendia Cordella Hutchinson, MD;  Location: [facility] OR;  Service: Orthopedics;  Laterality: Left;   TRIGGER FINGER RELEASE Right     MEDICATIONS:  acetaminophen  (TYLENOL ) 650 MG CR tablet   aspirin  EC 81 MG tablet   atorvastatin  (LIPITOR) 20 MG tablet   Calcium  Carb-Cholecalciferol (CALCIUM  600 + D PO)   denosumab -bbdz (JUBBONTI) 60 MG/ML SOSY   MAGNESIUM PO   Multiple Vitamin (MULTIVITAMIN WITH MINERALS) TABS tablet   No current facility-administered medications for this encounter.    Isaiah Ruder, PA-C Surgical Short Stay/Anesthesiology [facility] Phone [phone] [facility] Phone [phone] 05/03/2024 3:49 PM

## 2024-05-03 NOTE — Anesthesia Preprocedure Evaluation (Signed)
"                                    Anesthesia Evaluation  Patient identified by MRN, date of birth, ID band Patient awake    Reviewed: Allergy & Precautions, NPO status , Patient's Chart, lab work & pertinent test results  History of Anesthesia Complications Negative for: history of anesthetic complications  Airway Mallampati: I  TM Distance: >3 FB Neck ROM: Full    Dental  (+) Teeth Intact, Dental Advisory Given, Missing, Poor Dentition,    Pulmonary Current Smoker and Patient abstained from smoking.   breath sounds clear to auscultation       Cardiovascular hypertension, + Peripheral Vascular Disease (Bilateral SFA occlusions (R>L pain))  + dysrhythmias (RBBB)  Rhythm:Regular Rate:Normal   HLD  EKG (per Cardiology note): RBBB    Neuro/Psych negative neurological ROS     GI/Hepatic negative GI ROS, Neg liver ROS,,,  Endo/Other    Renal/GU Renal disease     Musculoskeletal   Abdominal   Peds  Hematology  Hgb 14.8, Plts 220K (05/02/24)    Anesthesia Other Findings   Reproductive/Obstetrics                              Anesthesia Physical Anesthesia Plan  ASA: 3  Anesthesia Plan: General   Post-op Pain Management:    Induction: Intravenous  PONV Risk Score and Plan: 2 and Ondansetron , Dexamethasone , Propofol  infusion and Treatment may vary due to age or medical condition  Airway Management Planned: Oral ETT  Additional Equipment: Arterial line  Intra-op Plan:   Post-operative Plan: Extubation in OR  Informed Consent: I have reviewed the patients History and Physical, chart, labs and discussed the procedure including the risks, benefits and alternatives for the proposed anesthesia with the patient or authorized representative who has indicated his/her understanding and acceptance.     Dental advisory given  Plan Discussed with: CRNA  Anesthesia Plan Comments: (PAT note written 05/03/2024 by Alyson Ki, PA-C.  )         Anesthesia Quick Evaluation  "

## 2024-05-05 ENCOUNTER — Encounter (HOSPITAL_COMMUNITY): Payer: Self-pay | Admitting: Vascular Surgery

## 2024-05-05 ENCOUNTER — Encounter (HOSPITAL_COMMUNITY)
Admission: RE | Disposition: A | Discharge status: Home / Self Care | Payer: Self-pay | Source: Home / Self Care | Attending: Vascular Surgery

## 2024-05-05 ENCOUNTER — Other Ambulatory Visit: Payer: Self-pay

## 2024-05-05 ENCOUNTER — Inpatient Hospital Stay (HOSPITAL_COMMUNITY)
Admission: RE | Admit: 2024-05-05 | Discharge: 2024-05-07 | DRG: 254 | Disposition: A | Discharge status: Home / Self Care | Attending: Vascular Surgery | Admitting: Vascular Surgery

## 2024-05-05 ENCOUNTER — Inpatient Hospital Stay (HOSPITAL_COMMUNITY)

## 2024-05-05 DIAGNOSIS — M810 Age-related osteoporosis without current pathological fracture: Secondary | ICD-10-CM | POA: Diagnosis present

## 2024-05-05 DIAGNOSIS — I10 Essential (primary) hypertension: Secondary | ICD-10-CM | POA: Diagnosis present

## 2024-05-05 DIAGNOSIS — I70221 Atherosclerosis of native arteries of extremities with rest pain, right leg: Principal | ICD-10-CM | POA: Diagnosis present

## 2024-05-05 DIAGNOSIS — F17210 Nicotine dependence, cigarettes, uncomplicated: Secondary | ICD-10-CM | POA: Diagnosis present

## 2024-05-05 DIAGNOSIS — Z95828 Presence of other vascular implants and grafts: Principal | ICD-10-CM

## 2024-05-05 DIAGNOSIS — Z8249 Family history of ischemic heart disease and other diseases of the circulatory system: Secondary | ICD-10-CM

## 2024-05-05 DIAGNOSIS — I4510 Unspecified right bundle-branch block: Secondary | ICD-10-CM | POA: Diagnosis present

## 2024-05-05 DIAGNOSIS — Z79899 Other long term (current) drug therapy: Secondary | ICD-10-CM | POA: Diagnosis not present

## 2024-05-05 DIAGNOSIS — I70229 Atherosclerosis of native arteries of extremities with rest pain, unspecified extremity: Secondary | ICD-10-CM | POA: Diagnosis present

## 2024-05-05 DIAGNOSIS — Z7982 Long term (current) use of aspirin: Secondary | ICD-10-CM | POA: Diagnosis not present

## 2024-05-05 LAB — CBC
HCT: 35.6 % — ABNORMAL LOW (ref 36.0–46.0)
Hemoglobin: 12.3 g/dL (ref 12.0–15.0)
MCH: 33.3 pg (ref 26.0–34.0)
MCHC: 34.6 g/dL (ref 30.0–36.0)
MCV: 96.5 fL (ref 80.0–100.0)
Platelets: 183 K/uL (ref 150–400)
RBC: 3.69 MIL/uL — ABNORMAL LOW (ref 3.87–5.11)
RDW: 11.3 % — ABNORMAL LOW (ref 11.5–15.5)
WBC: 10 K/uL (ref 4.0–10.5)
nRBC: 0 % (ref 0.0–0.2)

## 2024-05-05 LAB — ABO/RH: ABO/RH(D): A POS

## 2024-05-05 LAB — CREATININE, SERUM
Creatinine, Ser: 1.04 mg/dL — ABNORMAL HIGH (ref 0.44–1.00)
GFR, Estimated: 53 mL/min — ABNORMAL LOW

## 2024-05-05 LAB — POCT ACTIVATED CLOTTING TIME: Activated Clotting Time: 225 s

## 2024-05-05 MED ORDER — HEPARIN SODIUM (PORCINE) 1000 UNIT/ML IJ SOLN
INTRAMUSCULAR | Status: AC
  Filled 2024-05-05: qty 20

## 2024-05-05 MED ORDER — METOPROLOL TARTRATE 5 MG/5ML IV SOLN: 2.5000 mg | INTRAVENOUS | Status: DC | PRN

## 2024-05-05 MED ORDER — ASPIRIN 81 MG PO TBEC
81.0000 mg | DELAYED_RELEASE_TABLET | Freq: Every day | ORAL | Status: DC
  Administered 2024-05-06 – 2024-05-07 (×2): 81 mg via ORAL
  Filled 2024-05-05 (×2): qty 1

## 2024-05-05 MED ORDER — MORPHINE SULFATE (PF) 2 MG/ML IV SOLN: 2.0000 mg | INTRAVENOUS | Status: DC | PRN

## 2024-05-05 MED ORDER — HEPARIN SODIUM (PORCINE) 5000 UNIT/ML IJ SOLN
5000.0000 [IU] | Freq: Three times a day (TID) | INTRAMUSCULAR | Status: DC
  Administered 2024-05-06 – 2024-05-07 (×4): 5000 [IU] via SUBCUTANEOUS
  Filled 2024-05-05 (×4): qty 1

## 2024-05-05 MED ORDER — LIDOCAINE 2% (20 MG/ML) 5 ML SYRINGE
INTRAMUSCULAR | Status: DC | PRN
  Administered 2024-05-05: 40 mg via INTRAVENOUS

## 2024-05-05 MED ORDER — ONDANSETRON HCL 4 MG/2ML IJ SOLN
INTRAMUSCULAR | Status: AC
  Filled 2024-05-05: qty 2

## 2024-05-05 MED ORDER — LABETALOL HCL 5 MG/ML IV SOLN: 10.0000 mg | INTRAVENOUS | Status: DC | PRN

## 2024-05-05 MED ORDER — ONDANSETRON HCL 4 MG/2ML IJ SOLN: 4.0000 mg | Freq: Once | INTRAMUSCULAR | Status: DC | PRN

## 2024-05-05 MED ORDER — ACETAMINOPHEN 650 MG RE SUPP: 325.0000 mg | RECTAL | Status: DC | PRN

## 2024-05-05 MED ORDER — ATORVASTATIN CALCIUM 10 MG PO TABS
20.0000 mg | ORAL_TABLET | Freq: Every day | ORAL | Status: DC
  Administered 2024-05-06 – 2024-05-07 (×2): 20 mg via ORAL
  Filled 2024-05-05 (×3): qty 2

## 2024-05-05 MED ORDER — SODIUM CHLORIDE 0.9 % IV SOLN: INTRAVENOUS | Status: DC

## 2024-05-05 MED ORDER — 0.9 % SODIUM CHLORIDE (POUR BTL) OPTIME
TOPICAL | Status: DC | PRN
  Administered 2024-05-05: 2000 mL

## 2024-05-05 MED ORDER — OXYCODONE-ACETAMINOPHEN 5-325 MG PO TABS
1.0000 | ORAL_TABLET | ORAL | Status: DC | PRN
  Administered 2024-05-07: 1 via ORAL
  Filled 2024-05-05: qty 1

## 2024-05-05 MED ORDER — ACETAMINOPHEN 10 MG/ML IV SOLN: 1000.0000 mg | Freq: Once | INTRAVENOUS | Status: DC | PRN

## 2024-05-05 MED ORDER — SUGAMMADEX SODIUM 200 MG/2ML IV SOLN
INTRAVENOUS | Status: DC | PRN
  Administered 2024-05-05: 200 mg via INTRAVENOUS

## 2024-05-05 MED ORDER — PROTAMINE SULFATE 10 MG/ML IV SOLN
INTRAVENOUS | Status: AC
  Filled 2024-05-05: qty 5

## 2024-05-05 MED ORDER — DEXAMETHASONE SOD PHOSPHATE PF 10 MG/ML IJ SOLN
INTRAMUSCULAR | Status: DC | PRN
  Administered 2024-05-05: 8 mg via INTRAVENOUS

## 2024-05-05 MED ORDER — LIDOCAINE 2% (20 MG/ML) 5 ML SYRINGE
INTRAMUSCULAR | Status: AC
  Filled 2024-05-05: qty 5

## 2024-05-05 MED ORDER — PHENOL 1.4 % MT LIQD: 1.0000 | OROMUCOSAL | Status: DC | PRN

## 2024-05-05 MED ORDER — CHLORHEXIDINE GLUCONATE CLOTH 2 % EX PADS: 6.0000 | MEDICATED_PAD | Freq: Once | CUTANEOUS | Status: DC

## 2024-05-05 MED ORDER — PHENYLEPHRINE HCL-NACL 20-0.9 MG/250ML-% IV SOLN
INTRAVENOUS | Status: DC | PRN
  Administered 2024-05-05: 20 ug/min via INTRAVENOUS

## 2024-05-05 MED ORDER — CEFAZOLIN SODIUM-DEXTROSE 2-4 GM/100ML-% IV SOLN
2.0000 g | INTRAVENOUS | Status: AC
  Administered 2024-05-05: 2 g via INTRAVENOUS
  Filled 2024-05-05: qty 100

## 2024-05-05 MED ORDER — DEXAMETHASONE SOD PHOSPHATE PF 10 MG/ML IJ SOLN
INTRAMUSCULAR | Status: AC
  Filled 2024-05-05: qty 1

## 2024-05-05 MED ORDER — BISACODYL 5 MG PO TBEC: 5.0000 mg | DELAYED_RELEASE_TABLET | Freq: Every day | ORAL | Status: DC | PRN

## 2024-05-05 MED ORDER — FENTANYL CITRATE (PF) 250 MCG/5ML IJ SOLN
INTRAMUSCULAR | Status: DC | PRN
  Administered 2024-05-05 (×5): 50 ug via INTRAVENOUS

## 2024-05-05 MED ORDER — PROPOFOL 10 MG/ML IV BOLUS
INTRAVENOUS | Status: AC
  Filled 2024-05-05: qty 20

## 2024-05-05 MED ORDER — HEPARIN 6000 UNIT IRRIGATION SOLUTION
Status: AC
  Filled 2024-05-05: qty 500

## 2024-05-05 MED ORDER — HEPARIN SODIUM (PORCINE) 1000 UNIT/ML IJ SOLN
INTRAMUSCULAR | Status: DC | PRN
  Administered 2024-05-05: 5000 [IU] via INTRAVENOUS

## 2024-05-05 MED ORDER — HEPARIN 6000 UNIT IRRIGATION SOLUTION
Status: DC | PRN
  Administered 2024-05-05: 1

## 2024-05-05 MED ORDER — PROPOFOL 10 MG/ML IV BOLUS
INTRAVENOUS | Status: DC | PRN
  Administered 2024-05-05: 60 mg via INTRAVENOUS
  Administered 2024-05-05: 40 mg via INTRAVENOUS

## 2024-05-05 MED ORDER — EPHEDRINE SULFATE-NACL 50-0.9 MG/10ML-% IV SOSY
PREFILLED_SYRINGE | INTRAVENOUS | Status: DC | PRN
  Administered 2024-05-05: 5 mg via INTRAVENOUS

## 2024-05-05 MED ORDER — AMISULPRIDE (ANTIEMETIC) 5 MG/2ML IV SOLN: 10.0000 mg | Freq: Once | INTRAVENOUS | Status: DC | PRN

## 2024-05-05 MED ORDER — CHLORHEXIDINE GLUCONATE 0.12 % MT SOLN: 15.0000 mL | Freq: Once | OROMUCOSAL | Status: AC

## 2024-05-05 MED ORDER — ONDANSETRON HCL 4 MG/2ML IJ SOLN: 4.0000 mg | Freq: Four times a day (QID) | INTRAMUSCULAR | Status: DC | PRN

## 2024-05-05 MED ORDER — CEFAZOLIN SODIUM-DEXTROSE 2-4 GM/100ML-% IV SOLN
2.0000 g | Freq: Three times a day (TID) | INTRAVENOUS | Status: AC
  Administered 2024-05-05 – 2024-05-06 (×2): 2 g via INTRAVENOUS
  Filled 2024-05-05 (×2): qty 100

## 2024-05-05 MED ORDER — ONDANSETRON HCL 4 MG/2ML IJ SOLN
INTRAMUSCULAR | Status: DC | PRN
  Administered 2024-05-05: 4 mg via INTRAVENOUS

## 2024-05-05 MED ORDER — OXYCODONE HCL 5 MG PO TABS: 5.0000 mg | ORAL_TABLET | Freq: Once | ORAL | Status: DC | PRN

## 2024-05-05 MED ORDER — ROCURONIUM BROMIDE 10 MG/ML (PF) SYRINGE
PREFILLED_SYRINGE | INTRAVENOUS | Status: AC
  Filled 2024-05-05: qty 10

## 2024-05-05 MED ORDER — DOCUSATE SODIUM 100 MG PO CAPS
100.0000 mg | ORAL_CAPSULE | Freq: Every day | ORAL | Status: DC
  Administered 2024-05-06 – 2024-05-07 (×2): 100 mg via ORAL
  Filled 2024-05-05 (×2): qty 1

## 2024-05-05 MED ORDER — LACTATED RINGERS IV SOLN: INTRAVENOUS | Status: DC | PRN

## 2024-05-05 MED ORDER — CHLORHEXIDINE GLUCONATE 0.12 % MT SOLN
OROMUCOSAL | Status: AC
  Administered 2024-05-05: 15 mL via OROMUCOSAL
  Filled 2024-05-05: qty 15

## 2024-05-05 MED ORDER — FENTANYL CITRATE (PF) 100 MCG/2ML IJ SOLN: 25.0000 ug | INTRAMUSCULAR | Status: DC | PRN

## 2024-05-05 MED ORDER — OXYCODONE HCL 5 MG/5ML PO SOLN: 5.0000 mg | Freq: Once | ORAL | Status: DC | PRN

## 2024-05-05 MED ORDER — SODIUM CHLORIDE 0.9 % IV SOLN: INTRAVENOUS | Status: AC

## 2024-05-05 MED ORDER — ROCURONIUM BROMIDE 10 MG/ML (PF) SYRINGE
PREFILLED_SYRINGE | INTRAVENOUS | Status: DC | PRN
  Administered 2024-05-05: 20 mg via INTRAVENOUS
  Administered 2024-05-05: 50 mg via INTRAVENOUS
  Administered 2024-05-05: 10 mg via INTRAVENOUS

## 2024-05-05 MED ORDER — HYDRALAZINE HCL 20 MG/ML IJ SOLN: 5.0000 mg | INTRAMUSCULAR | Status: DC | PRN

## 2024-05-05 MED ORDER — SODIUM CHLORIDE 0.9 % IV SOLN: 500.0000 mL | Freq: Once | INTRAVENOUS | Status: DC | PRN

## 2024-05-05 MED ORDER — FENTANYL CITRATE (PF) 250 MCG/5ML IJ SOLN
INTRAMUSCULAR | Status: AC
  Filled 2024-05-05: qty 5

## 2024-05-05 MED ORDER — HEMOSTATIC AGENTS (NO CHARGE) OPTIME
TOPICAL | Status: DC | PRN
  Administered 2024-05-05: 1 via TOPICAL

## 2024-05-05 MED ORDER — POLYETHYLENE GLYCOL 3350 17 G PO PACK: 17.0000 g | PACK | Freq: Every day | ORAL | Status: DC | PRN

## 2024-05-05 MED ORDER — LACTATED RINGERS IV SOLN: INTRAVENOUS | Status: DC

## 2024-05-05 MED ORDER — POTASSIUM CHLORIDE CRYS ER 20 MEQ PO TBCR: 40.0000 meq | EXTENDED_RELEASE_TABLET | Freq: Every day | ORAL | Status: DC | PRN

## 2024-05-05 MED ORDER — PROTAMINE SULFATE 10 MG/ML IV SOLN
INTRAVENOUS | Status: DC | PRN
  Administered 2024-05-05: 30 mg via INTRAVENOUS

## 2024-05-05 MED ORDER — ACETAMINOPHEN 325 MG PO TABS
325.0000 mg | ORAL_TABLET | ORAL | Status: DC | PRN
  Administered 2024-05-05 – 2024-05-06 (×2): 650 mg via ORAL
  Filled 2024-05-05 (×2): qty 2

## 2024-05-05 MED ORDER — ORAL CARE MOUTH RINSE: 15.0000 mL | Freq: Once | OROMUCOSAL | Status: AC

## 2024-05-05 MED ORDER — EPHEDRINE 5 MG/ML INJ
INTRAVENOUS | Status: AC
  Filled 2024-05-05: qty 5

## 2024-05-05 NOTE — Anesthesia Postprocedure Evaluation (Signed)
"   Anesthesia Post Note  Patient: Paige Glass  Procedure(s) Performed: RIGHT FEMORAL-POPLITEAL ARTERY BYPASS, USING GORE PROPATEN GRAFT (Right: Leg Upper)     Patient location during evaluation: PACU Anesthesia Type: General Level of consciousness: awake Pain management: pain level controlled Vital Signs Assessment: post-procedure vital signs reviewed and stable Respiratory status: spontaneous breathing Cardiovascular status: blood pressure returned to baseline Postop Assessment: no apparent nausea or vomiting Anesthetic complications: no   No notable events documented.  Last Vitals:  Vitals:   05/05/24 1230 05/05/24 1245  BP: (!) 108/58 (!) 102/53  Pulse: 65 64  Resp: 11 14  Temp:    SpO2: 93% 94%    Last Pain:  Vitals:   05/05/24 1230  TempSrc:   PainSc: Asleep                 Feige Lowdermilk T Colhoun      "

## 2024-05-05 NOTE — Op Note (Signed)
 "   OPERATIVE NOTE  PROCEDURE:   right common femoral to above knee popliteal artery bypass with PTFE  PRE-OPERATIVE DIAGNOSIS: CLTI with rest pain  POST-OPERATIVE DIAGNOSIS: same as above   SURGEON: Norman GORMAN Serve MD  ASSISTANT(S): Adina Sender, PA  Given the complexity of the case,  the assistant was necessary in order to expedient the procedure and safely perform the technical aspects of the operation.  The assistant provided traction and countertraction to assist with exposure of the artery proximally and distally.  They assisted with suture ligature of multiple branches.  Their assistance was critical in the performance of both the proximal and distal anastomosis.These skills, especially following the Prolene suture for the anastomosis, could not have been adequately performed by a scrub tech assistant.   ANESTHESIA: general  ESTIMATED BLOOD LOSS: 50 cc  FINDING(S): Significant calcific disease throughout the popliteal artery, even down through the popliteal space the artery felt severely calcified.  We were able to find a soft spot in the above-knee popliteal artery to sew the distal aspect of the bypass although there was some posterior/sidewall plaque.  The common femoral artery was also calcified although there was adequate pulsatile inflow and most of this disease was on the posterior wall. On completion there was excellent augmentation in the wound with the bypass open versus clamped as well as at the DP on the foot..  The DP was also palpable with the bypass open.  SPECIMEN(S):  none  INDICATIONS:   Paige Glass is a 84 y.o. female with CL TI and rest pain.  She underwent bilateral lower extremity angiogram on 01/25/2024.  This demonstrated bilateral flush occlusions of the SFAs with reconstitution of the above-knee popliteal bilaterally.  She presented back to clinic after her angiogram and explained that she was having nocturnal rest pain on the right worse than the left.   The angiogram was reviewed and it appeared that there was an adequate area in the above-knee popliteal to use as a distal target for bypass.  Femoral to above-knee popliteal artery bypass with PTFE was offered, risks and benefits were reviewed and she elected to proceed.  DESCRIPTION: After full informed written consent was obtained, the patient was brought back to the operating room and placed supine upon the operating table.  After induction with general anesthesia the left leg was prepped and draped circumferentially in the usual sterile fashion.  Preoperative antibiotics were administered and a timeout was performed.   The left popliteal artery was visualized with ultrasound and a longitudinal incision was made just anterior to the sartorius.  The incision was carried deeper via electrocautery and the muscular fascia was incised.  The vascular bundle was identified and the popliteal artery was dissected free circumferentially for an approximate 5 cm segment.  Branches were controlled with 2-0 silk ties and Vesseloops were placed proximally and distally.  The popliteal artery was found to be diseased and calcified down through the entirety of the popliteal. Attention was the turned to the proximal target for the bypass, the common femoral artery. A transverse incision was made overlying the common femoral artery  Using blunt dissection and electrocautery, the artery was dissected out from the inguinal ligament down to the femoral bifurcation.  The superficial femoral artery, profunda femoral artery, and external iliac artery were dissected circumferentially and vessel loops applied.  Circumflex branches were controlled with silk ties.  This common femoral artery was found to be calcified. At this point, I bluntly dissected a  subsartorial space and passed the long Gore metal tunneler  to the groin incision.  The bullet on the tunneler was removed and a 6 mm ringed PTFE graft was passed through the metal  tunnel, taking care to maintain the orientation without twisting of the conduit.   The patient was then systemically heparinized and after waiting three minutes, the external iliac, profunda and superficial femoral arteries were clamped.  An arteriotomy was made with an 11 blade and extended proximally and distally with a Potts scissor.  The proximal end of the conduit was spatulated to the dimensions of the arteriotomy.  The conduit was sewn end-to-side to the CFA with a running stitch of 6-0 prolene.  All vessels were forward flushed and backbled and upon completion of the anastomosis there was noted to be pulsatile flow through the bypass. The bypass was clamped in the groin incision and attention was turned back to the popliteal exposure.  The above-knee popliteal artery was clamped with vascular clamps proximally and distally.  Arteriotomy was made with an 11 blade and extended proximally distally with Potts scissors.  The distal end of the conduit was cut to length and spatulated to the dimensions of the arteriotomy.  The conduit was sewn end-to-side to the above-knee popliteal artery with a running 6-0 Prolene.  All vessels were forward flushed and backbled and upon completion of the anastomosis there was pulsatile flow through the bypass.  A hemostatic agent was placed around both anastomoses.  Continuous doppler examination demonstrated excellent augmentation of the popliteal artery and DP at the foot with the bypass open versus clamped. The DP was also palpable. All incisions were copious irrigated and inspected for hemostasis which was achieved with electrocautery, vascular clips and suture ligature.  The medial thigh incision was closed in layers with 2-0 and 3-0 Vicryl, followed by 4-0 Monocryl for the skin.  The groin incision was closed in layers with 2-0 and 3-0 Vicryl and 4-0 Monocryl for the skin. All counts were correct. The patient tolerated the procedure well and was transferred to PACU in  stable condition.     COMPLICATIONS: none apparent  CONDITION: stable  Norman GORMAN Serve MD [facility] Phone Number: [phone] 05/05/2024 10:19 AM     "

## 2024-05-05 NOTE — H&P (Signed)
 Patient seen and examined.  No complaints. No changes to medication history or physical exam since last seen. After discussing the risks and benefits of R femoral-popliteal bypass, Paige Glass elected to proceed.   Norman GORMAN Serve MD         Patient ID: Paige Glass Seat, female   DOB: 1940/08/27, 84 y.o.   MRN: [id number]   Reason for Consult: Follow-up   Referred by [name], MD   Subjective:    Subjective HPI Paige Glass is a 84 y.o. female with CL TI and rest pain.  She underwent bilateral lower extremity angiogram on 01/25/2024.  That demonstrated bilateral flush occlusions of the SFAs and explained would require surgical bypass.  She presents today for discussion. Today she reports that she continues to have bilateral lower extremity claudication and rest pain.  Right greater than left.  She denies slow or nonhealing wounds.  She continues to smoke and is down to about a third of a pack per day       Past Medical History:  Diagnosis Date   Back pain     Hypertension     Joint pain     Joint swelling     Nocturia     Osteoporosis               Family History  Problem Relation Age of Onset   Healthy Mother     Healthy Father               Past Surgical History:  Procedure Laterality Date   ABDOMINAL AORTOGRAM W/LOWER EXTREMITY N/A 01/25/2024    Procedure: ABDOMINAL AORTOGRAM W/LOWER EXTREMITY;  Surgeon: Serve Norman GORMAN, MD;  Location: [facility] INVASIVE CV LAB;  Service: Cardiovascular;  Laterality: N/A;   bilateral ear surgery       CATARACT EXTRACTION W/PHACO Right 08/20/2020    Procedure: CATARACT EXTRACTION PHACO AND INTRAOCULAR LENS PLACEMENT RIGHT EYE;  Surgeon: Harrie Agent, MD;  Location: [facility] ORS;  Service: Ophthalmology;  Laterality: Right;  right CDE=11.07   CATARACT EXTRACTION W/PHACO Left 09/03/2020    Procedure: CATARACT EXTRACTION PHACO AND INTRAOCULAR LENS PLACEMENT (IOC);  Surgeon: Harrie Agent, MD;  Location: [facility] ORS;  Service: Ophthalmology;   Laterality: Left;  CDE 19.15   LOWER EXTREMITY ANGIOGRAPHY N/A 01/25/2024    Procedure: Lower Extremity Angiography;  Surgeon: Serve Norman GORMAN, MD;  Location: [facility] INVASIVE CV LAB;  Service: Cardiovascular;  Laterality: N/A;   LOWER EXTREMITY INTERVENTION N/A 01/25/2024    Procedure: LOWER EXTREMITY INTERVENTION;  Surgeon: Serve Norman GORMAN, MD;  Location: [facility] INVASIVE CV LAB;  Service: Cardiovascular;  Laterality: N/A;   OPEN REDUCTION INTERNAL FIXATION (ORIF) FOOT LISFRANC FRACTURE Left 07/14/2016    Procedure: OPEN REDUCTION INTERNAL FIXATION (ORIF) FOOT LISFRANC FRACTURE;  Surgeon: Glendia Cordella Hutchinson, MD;  Location: [facility] OR;  Service: Orthopedics;  Laterality: Left;   TRIGGER FINGER RELEASE Right            Short Social History:  Social History         Tobacco Use   Smoking status: Every Day      Current packs/day: 0.50      Average packs/day: 0.5 packs/day for 32.5 years (16.3 ttl pk-yrs)      Types: Cigarettes      Start date: 08/12/1991   Smokeless tobacco: Never   Tobacco comments:      Patient is cutting back on her smoking  Substance Use Topics   Alcohol use: No  Allergies  No Known Allergies           Current Outpatient Medications  Medication Sig Dispense Refill   acetaminophen  (TYLENOL ) 650 MG CR tablet Take 650 mg by mouth at bedtime.       aspirin  EC 81 MG tablet Take 81 mg by mouth daily. Swallow whole.       atorvastatin  (LIPITOR) 20 MG tablet Take 20 mg by mouth daily.       CALCIUM  PO Take 1,200 mg by mouth daily. 600 mg each       denosumab -bbdz (JUBBONTI) 60 MG/ML SOSY Inject 60 mg into the skin every 6 (six) months.       MAGNESIUM PO Take 1 capsule by mouth at bedtime.       Multiple Vitamin (MULTIVITAMIN WITH MINERALS) TABS tablet Take 1 tablet by mouth daily.          No current facility-administered medications for this visit.        REVIEW OF SYSTEMS  All other systems were reviewed and are negative       Objective:    Objective[] Expand  by Default    Vitals:    02/19/24 1414  BP: 128/66  Pulse: 60  Resp: 20  Temp: 97.8 F (36.6 C)  TempSrc: Temporal  SpO2: 97%  Weight: 105 lb 8 oz (47.9 kg)  Height: 4' 8 (1.422 m)    Body mass index is 23.65 kg/m.   Physical Exam General: no acute distress Cardiac: hemodynamically stable Extremities: no edema, cyanosis or wounds     Data: Reviewed angiogram from 10/13   ABI +---------+------------------+-----+----------+--------+  Right   Rt Pressure (mmHg)IndexWaveform  Comment   +---------+------------------+-----+----------+--------+  Brachial 135                    triphasic           +---------+------------------+-----+----------+--------+  PTA     90                0.61 monophasic          +---------+------------------+-----+----------+--------+  DP      106               0.72 monophasic          +---------+------------------+-----+----------+--------+  Great Toe63                0.43                     +---------+------------------+-----+----------+--------+   +---------+------------------+-----+----------+-------+  Left    Lt Pressure (mmHg)IndexWaveform  Comment  +---------+------------------+-----+----------+-------+  Brachial 148                    triphasic          +---------+------------------+-----+----------+-------+  PTA     98                0.66 monophasic         +---------+------------------+-----+----------+-------+  DP      91                0.61 monophasic         +---------+------------------+-----+----------+-------+  Great Toe49                0.33                    +---------+------------------+-----+----------+-------+    Vein mapping +----------------+-----------+--------------+----------------+-----------+  RT Diameter (cm)RT Findings  GSV      LT Diameter (cm)LT Findings  +----------------+-----------+--------------+----------------+-----------+       0.29                  Proximal thigh      0.41                   +----------------+-----------+--------------+----------------+-----------+       0.20                   Mid thigh         0.32                   +----------------+-----------+--------------+----------------+-----------+       0.14                  Distal thigh       0.32       branching   +----------------+-----------+--------------+----------------+-----------+       0.20                      Knee           0.17                   +----------------+-----------+--------------+----------------+-----------+       0.18       branching   Prox calf         0.18                   +----------------+-----------+--------------+----------------+-----------+       0.20       branching    Mid calf         0.23                   +----------------+-----------+--------------+----------------+-----------+       0.27       branching  Distal calf        0.20                   +----------------+-----------+--------------+----------------+-----------+       0.21                     Ankle           0.19                   +----------------+-----------+--------------+----------------+-----------+         Assessment/Plan:    Paige Glass Stones is a 84 y.o. female with CL TI and rest pain.  Right greater than left.  We again reviewed the results of her angiogram that was done in October and explained that she would require femoral-popliteal artery bypass bilaterally.  Since she is having worse pain on the right we discussed proceeding with right side first. Based on her angiogram I do think it would be a right femoral to above-knee popliteal artery bypass with PTFE. We reviewed the risks and benefits and I explained that smoking increases her risk of infection and decreases patency rate.  Strongly encouraged smoking cessation.   Plan for right femoral to above-knee popliteal artery bypass with PTFE Will need cardiac  clearance   Recommendations to optimize cardiovascular risk: Abstinence from all tobacco products. Blood glucose control with goal A1c < 7%. Blood pressure control with goal blood pressure < 140/90 mmHg. Lipid reduction therapy with goal LDL-C <55 mg/dL  Aspirin  81mg  PO QD.  Atorvastatin  40-80mg  PO QD (or other  high intensity statin therapy).     Norman GORMAN Serve MD [facility]

## 2024-05-05 NOTE — Transfer of Care (Signed)
 Immediate Anesthesia Transfer of Care Note  Patient: Paige Glass  Procedure(s) Performed: RIGHT FEMORAL-POPLITEAL ARTERY BYPASS, USING GORE PROPATEN GRAFT (Right: Leg Upper)  Patient Location: PACU  Anesthesia Type:General  Level of Consciousness: awake and alert   Airway & Oxygen Therapy: Patient Spontanous Breathing  Post-op Assessment: Report given to RN and Post -op Vital signs reviewed and stable  Post vital signs: Reviewed and stable  Last Vitals:  Vitals Value Taken Time  BP 128/53 05/05/24 10:32  Temp    Pulse 65 05/05/24 10:34  Resp 19 05/05/24 10:34  SpO2 95 % 05/05/24 10:34  Vitals shown include unfiled device data.  Last Pain:  Vitals:   05/05/24 0623  TempSrc:   PainSc: 0-No pain      Patients Stated Pain Goal: 0 (05/05/24 9376)  Complications: No notable events documented.

## 2024-05-05 NOTE — Anesthesia Procedure Notes (Signed)
 Procedure Name: Intubation Date/Time: 05/05/2024 8:01 AM  Performed by: Boyce Shilling, CRNAPre-anesthesia Checklist: Patient identified, Emergency Drugs available, Suction available, Timeout performed and Patient being monitored Patient Re-evaluated:Patient Re-evaluated prior to induction Oxygen Delivery Method: Circle system utilized Preoxygenation: Pre-oxygenation with 100% oxygen Induction Type: IV induction Ventilation: Mask ventilation without difficulty Laryngoscope Size: Mac and 3 Grade View: Grade I Tube type: Oral Tube size: 7.0 mm Number of attempts: 1 Airway Equipment and Method: Stylet Placement Confirmation: ETT inserted through vocal cords under direct vision, positive ETCO2, CO2 detector and breath sounds checked- equal and bilateral Secured at: 22 cm Tube secured with: Tape Dental Injury: Teeth and Oropharynx as per pre-operative assessment

## 2024-05-05 NOTE — Anesthesia Procedure Notes (Signed)
 Arterial Line Insertion Start/End1/22/2026 8:03 AM, 05/05/2024 8:06 AM Performed by: Lucious Debby BRAVO, MD, anesthesiologist  Patient location: OR. Preanesthetic checklist: patient identified, IV checked, risks and benefits discussed, surgical consent, monitors and equipment checked, pre-op evaluation, timeout performed and anesthesia consent Patient sedated Right, radial was placed Catheter size: 20 G Hand hygiene performed   Attempts: 2 (Previous attempts by CRNA on left radial artery unsuccessful) Procedure performed using ultrasound to evaluate access site. Ultrasound Notes:relevant anatomy identified, ultrasound used to visualize needle entry, vessel patent under ultrasound and image(s) printed for medical record. Following insertion, dressing applied and Biopatch. Post procedure assessment: unchanged and normal  Patient tolerated the procedure well with no immediate complications.

## 2024-05-06 ENCOUNTER — Encounter (HOSPITAL_COMMUNITY): Payer: Self-pay | Admitting: Vascular Surgery

## 2024-05-06 DIAGNOSIS — Z48812 Encounter for surgical aftercare following surgery on the circulatory system: Secondary | ICD-10-CM

## 2024-05-06 LAB — CBC
HCT: 31.4 % — ABNORMAL LOW (ref 36.0–46.0)
Hemoglobin: 11.1 g/dL — ABNORMAL LOW (ref 12.0–15.0)
MCH: 33.9 pg (ref 26.0–34.0)
MCHC: 35.4 g/dL (ref 30.0–36.0)
MCV: 96 fL (ref 80.0–100.0)
Platelets: 202 K/uL (ref 150–400)
RBC: 3.27 MIL/uL — ABNORMAL LOW (ref 3.87–5.11)
RDW: 11.5 % (ref 11.5–15.5)
WBC: 11.6 K/uL — ABNORMAL HIGH (ref 4.0–10.5)
nRBC: 0 % (ref 0.0–0.2)

## 2024-05-06 LAB — BASIC METABOLIC PANEL WITH GFR
Anion gap: 9 (ref 5–15)
BUN: 16 mg/dL (ref 8–23)
CO2: 23 mmol/L (ref 22–32)
Calcium: 7.5 mg/dL — ABNORMAL LOW (ref 8.9–10.3)
Chloride: 106 mmol/L (ref 98–111)
Creatinine, Ser: 0.94 mg/dL (ref 0.44–1.00)
GFR, Estimated: >60 mL/min
Glucose, Bld: 125 mg/dL — ABNORMAL HIGH (ref 70–99)
Potassium: 4.2 mmol/L (ref 3.5–5.1)
Sodium: 138 mmol/L (ref 135–145)

## 2024-05-06 MED ORDER — CLOPIDOGREL BISULFATE 75 MG PO TABS
75.0000 mg | ORAL_TABLET | Freq: Every day | ORAL | Status: DC
  Administered 2024-05-06 – 2024-05-07 (×2): 75 mg via ORAL
  Filled 2024-05-06 (×2): qty 1

## 2024-05-06 NOTE — Progress Notes (Addendum)
" °  Progress Note    05/06/2024 7:27 AM 1 Day Post-Op  Subjective:  just complaining of burning in her left heel, right leg sore from surgery   Vitals:   05/05/24 2024 05/06/24 0332  BP: (!) 101/57 134/82  Pulse: 78 74  Resp: 18 19  Temp: 97.7 F (36.5 C) 97.7 F (36.5 C)  SpO2: 96% 96%   Physical Exam: Cardiac:  regular Lungs:  non labored Incisions:  Right groin, right AK popliteal incisions are clean, dry and intact Extremities: 2+ DP pulse on right bilateral feet warm and well perfused Abdomen:  soft Neurologic: alert and oriented  CBC    Component Value Date/Time   WBC 11.6 (H) 05/06/2024 0440   RBC 3.27 (L) 05/06/2024 0440   HGB 11.1 (L) 05/06/2024 0440   HCT 31.4 (L) 05/06/2024 0440   PLT 202 05/06/2024 0440   MCV 96.0 05/06/2024 0440   MCH 33.9 05/06/2024 0440   MCHC 35.4 05/06/2024 0440   RDW 11.5 05/06/2024 0440    BMET    Component Value Date/Time   NA 138 05/06/2024 0440   K 4.2 05/06/2024 0440   CL 106 05/06/2024 0440   CO2 23 05/06/2024 0440   GLUCOSE 125 (H) 05/06/2024 0440   BUN 16 05/06/2024 0440   CREATININE 0.94 05/06/2024 0440   CALCIUM  7.5 (L) 05/06/2024 0440   GFRNONAA >60 05/06/2024 0440   GFRAA >60 07/14/2016 1136    INR    Component Value Date/Time   INR 0.9 05/02/2024 1420     Intake/Output Summary (Last 24 hours) at 05/06/2024 0727 Last data filed at 05/06/2024 0631 Gross per 24 hour  Intake 2371.98 ml  Output 2985 ml  Net -613.02 ml     Assessment/Plan:  84 y.o. female is s/p right common femoral to above knee popliteal artery bypass with PTFE  1 Day Post-Op   RLE well perfused and warm with palpable Dp Right groin and right AK popliteal incisions are intact and well appearing Pain well controlled Hemodynamically stable VSS Mobilize as tolerated Has known SFA occlusion on the LLE. Will likely require surgical bypass in the near future on the left as well due to her rest pain Float left heel off of bed to help  with burning Continue Aspirin  and Statin PT/OT to evaluate today Anticipate if she progresses well today she can d/c home tomorrow She will have follow up arranged in our office in 2-3 weeks for incision check   Teretha Damme, PA-C [facility] [phone] 05/06/2024 7:27 AM   Palpable DP, recovering well. Mobilize with PT today. D/c when ambulating appropriately and pain controlled.  Norman GORMAN Serve MD [facility] Phone Number: [phone] 05/06/2024 12:41 PM  "

## 2024-05-06 NOTE — Discharge Instructions (Signed)
 [facility]  Discharge instructions  Lower Extremity Bypass Surgery  Please refer to the following instruction for your post-procedure care. Your surgeon or physician assistant will discuss any changes with you.  Activity  You are encouraged to walk as much as you can. You can slowly return to normal activities during the month after your surgery. Avoid strenuous activity and heavy lifting until your doctor tells you it's OK. Avoid activities such as vacuuming or swinging a golf club. Do not drive until your doctor give the OK and you are no longer taking prescription pain medications. It is also normal to have difficulty with sleep habits, eating and bowel movement after surgery. These will go away with time.  Bathing/Showering  Shower daily after you go home. Do not soak in a bathtub, hot tub, or swim until the incision heals completely.  Incision Care  Clean your incision with mild soap and water. Shower every day. Pat the area dry with a clean towel. You do not need a bandage unless otherwise instructed. Do not apply any ointments or creams to your incision. If you have open wounds you will be instructed how to care for them or a visiting nurse may be arranged for you. If you have staples or sutures along your incision they will be removed at your post-op appointment. You may have skin glue on your incision. Do not peel it off. It will come off on its own in about one week.  Wash the groin wound with soap and water daily and pat dry. (No tub bath-only shower)  Then put a dry gauze or washcloth in the groin to keep this area dry to help prevent wound infection.  Do this daily and as needed.  Do not use Vaseline or neosporin on your incisions.  Only use soap and water on your incisions and then protect and keep dry.  Diet  Resume your normal diet. There are no special food restrictions following this procedure. A low fat/ low cholesterol diet is  recommended for all patients with vascular disease. In order to heal from your surgery, it is CRITICAL to get adequate nutrition. Your body requires vitamins, minerals, and protein. Vegetables are the best source of vitamins and minerals. Vegetables also provide the perfect balance of protein. Processed food has little nutritional value, so try to avoid this.  Medications  Resume taking all your medications unless your doctor or physician assistant tells you not to. If your incision is causing pain, you may take over-the-counter pain relievers such as acetaminophen  (Tylenol ). If you were prescribed a stronger pain medication, please aware these medication can cause nausea and constipation. Prevent nausea by taking the medication with a snack or meal. Avoid constipation by drinking plenty of fluids and eating foods with high amount of fiber, such as fruits, vegetables, and grains. Take Colace 100 mg (an over-the-counter stool softener) twice a day as needed for constipation.  Do not take Tylenol  if you are taking prescription pain medications.  Follow Up  Our office will schedule a follow up appointment 2-3 weeks following discharge.  Please call us  immediately for any of the following conditions  Severe or worsening pain in your legs or feet while at rest or while walking Increase pain, redness, warmth, or drainage (pus) from your incision site(s) Fever of 101 degree or higher The swelling in your leg with the bypass suddenly worsens and becomes more painful than when you were in the hospital If you have  been instructed to feel your graft pulse then you should do so every day. If you can no longer feel this pulse, call the office immediately. Not all patients are given this instruction.  Leg swelling is common after leg bypass surgery.  The swelling should improve over a few months following surgery. To improve the swelling, you may elevate your legs above the level of your heart while you are  sitting or resting. Your surgeon or physician assistant may ask you to apply an ACE wrap or wear compression (TED) stockings to help to reduce swelling.  Reduce your risk of vascular disease  Stop smoking. If you would like help call [facility] at 1-800-QUIT-NOW (609-485-9870) or [facility] at 417-855-7064.  Manage your cholesterol Maintain a desired weight Control your diabetes weight Control your diabetes Keep your blood pressure down  If you have any questions, please call the office at [phone]

## 2024-05-06 NOTE — Evaluation (Signed)
 Occupational Therapy Evaluation Patient Details Name: Paige Glass Jhade Berko MRN: [id number] DOB: [birth year] Today's Date: 05/06/2024   History of Present Illness   Pt is a 84 yo female current smoker who presented to [facility] on 1/22 with bilateral lower extremity claudication and rest pain. The right LE is more painful than the left. Pt was admitted for critical lower limb ischemia. Pt is now s/p R femoral above knee popliteal artery bypass on 1/22. PMH includes: HTN, Osteoporosis, Peripheral vascular disease.     Clinical Impressions PTA pt lives independently with her husband. Supportive family present during session. Pt making excellent progress and mobilizing with S > 250 ft @ RW level . Requires min A with LB ADL due to pain - family can assist with ADL tasks at DC. Educated pt/family on strategies to reduce risk of falls. No further OT needed.      If plan is discharge home, recommend the following:   A little help with walking and/or transfers;A little help with bathing/dressing/bathroom;Assistance with cooking/housework;Assist for transportation     Functional Status Assessment   Patient has had a recent decline in their functional status and demonstrates the ability to make significant improvements in function in a reasonable and predictable amount of time.     Equipment Recommendations   None recommended by OT     Recommendations for Other Services         Precautions/Restrictions   Precautions Precautions: Fall Recall of Precautions/Restrictions: Intact Restrictions Weight Bearing Restrictions Per Provider Order: No     Mobility Bed Mobility Overal bed mobility: Needs Assistance Bed Mobility: Supine to Sit, Sit to Supine     Supine to sit: Min assist, HOB elevated, Used rails Sit to supine: HOB elevated, Used rails, Min assist   General bed mobility comments: Pt needing min A from supine to sit and sit to supine to bring RLE into the bed and bring it to EOB.     Transfers Overall transfer level: Needs assistance Equipment used: Rolling walker (2 wheels) Transfers: Sit to/from Stand Sit to Stand: Supervision                  Balance Overall balance assessment: Needs assistance Sitting-balance support: Bilateral upper extremity supported, Feet supported Sitting balance-Leahy Scale: Good     Standing balance support: Bilateral upper extremity supported, During functional activity, Reliant on assistive device for balance Standing balance-Leahy Scale: Fair                             ADL either performed or assessed with clinical judgement   ADL Overall ADL's : Needs assistance/impaired                                     Functional mobility during ADLs: Supervision/safety;Rolling walker (2 wheels) General ADL Comments: overall set up with the exception of min A for LB tasks, which family can assist with after DC.Discussed home set up , use of shower chair and strategies to reduce risk of falls     Vision         Perception         Praxis         Pertinent Vitals/Pain Pain Assessment Pain Assessment: Faces Faces Pain Scale: Hurts little more Pain Location: R LE Pain Descriptors / Indicators: Discomfort, Operative site guarding, Guarding Pain Intervention(s): Limited activity  within patient's tolerance     Extremity/Trunk Assessment Upper Extremity Assessment Upper Extremity Assessment: Overall WFL for tasks assessed   Lower Extremity Assessment Lower Extremity Assessment: Defer to PT evaluation RLE Deficits / Details: painful aroudn incision site RLE Sensation:  (Pt had no N/T prior to and after surgery.) RLE Coordination:  (circumduction on the RLE during gait for foot clearance)   Cervical / Trunk Assessment Cervical / Trunk Assessment: Normal   Communication Communication Communication: Impaired Factors Affecting Communication: Hearing impaired   Cognition Arousal:  Alert Behavior During Therapy: Impulsive, WFL for tasks assessed/performed Cognition:  (at baseline per daughter)                               Following commands: Intact       Cueing  General Comments   Cueing Techniques: Verbal cues;Tactile cues;Visual cues  VSS on RA   Exercises     Shoulder Instructions      Home Living Family/patient expects to be discharged to:: Private residence Living Arrangements: Spouse/significant other Available Help at Discharge: Family;Available 24 hours/day Type of Home: House Home Access: Level entry     Home Layout: One level;Laundry or work area in [employer] Shower/Tub: Tub/shower unit;Walk-in shower (Tub/shower unit in 1 bathroom and walk in shower in second bathroom)   Bathroom Toilet: Standard (Handicapped height toilet in husbands bathroom.) Bathroom Accessibility: Yes How Accessible: Accessible via walker Home Equipment: Cane - single point;Rolling Walker (2 wheels);BSC/3in1   Additional Comments: Pt has daughter, grandchildren, and available help. Pt has washer/dryer down stairs in basement with 13 steps with bilaterail rails. Pt uses both when going up and down stairs. Washer and dryer will be moved upstairs therefore pt will not be using the basement.      Prior Functioning/Environment Prior Level of Function : Independent/Modified Independent             Mobility Comments: Uses SPC for long distances but usually walks without AD ADLs Comments: Independent with all ADLs    OT Problem List:     OT Treatment/Interventions:        OT Goals(Current goals can be found in the care plan section)   Acute Rehab OT Goals Patient Stated Goal: home OT Goal Formulation: All assessment and education complete, DC therapy   OT Frequency:       Co-evaluation              AM-PAC OT 6 Clicks Daily Activity     Outcome Measure Help from another person eating meals?: None Help from another  person taking care of personal grooming?: None Help from another person toileting, which includes using toliet, bedpan, or urinal?: A Little Help from another person bathing (including washing, rinsing, drying)?: A Little Help from another person to put on and taking off regular upper body clothing?: None Help from another person to put on and taking off regular lower body clothing?: A Little 6 Click Score: 21   End of Session Equipment Utilized During Treatment: Gait belt;Rolling walker (2 wheels) Nurse Communication: Mobility status  Activity Tolerance: Patient tolerated treatment well Patient left: in bed;with call bell/phone within reach;with family/visitor present  OT Visit Diagnosis: Muscle weakness (generalized) (M62.81)                Time: 8381-8358 OT Time Calculation (min): 23 min Charges:  OT General Charges $OT Visit: 1 Visit OT Evaluation $OT  Eval Low Complexity: 1 Low  Rinoa Garramone, OT/L   Acute OT Clinical Specialist Acute Rehabilitation Services Pager [phone] Office [phone]   [facility] 05/06/2024, 6:01 PM

## 2024-05-06 NOTE — TOC Initial Note (Signed)
 Transition of Care (TOC) - Initial/Assessment Note  Rayfield Gobble RN, BSN Inpatient Care Management Unit 4E- RN Case Manager See Treatment Team for direct phone #   Patient Details  Name: Paige Glass MRN: [id number] Date of Birth: [birth year]  Transition of Care [facility]) CM/SW Contact:    Gobble Rayfield Hurst, RN Phone Number: 05/06/2024, 12:48 PM  Clinical Narrative:                 Pt s/p fempop, CM notified by [facility] liaison that [facility] office made pre-op referral for any [facility] needs.   Note therapy recommending HHPT, CM in to speak with pt, grand-daughter also present.  Discussed HH needs- per pt she was going to outpt therapy at [facility]. Pt/family agreeable to [facility] follow up, choice offered for [facility] provider and discussed [facility] office referral to [facility].  Pt agreeable to [facility] w/ [facility] to follow up post discharge.  Pt voiced she has cane and walker at home- no new DME needs identified.   Family request HH to contact daughter Dwayne- due to pt's [facility]- phone # is [phone].   [facility] liaison updated.   ICM to continue to follow.   Expected Discharge Plan: Home w Home Health Services Barriers to Discharge: Continued Medical Work up   Patient Goals and CMS Choice Patient states their goals for this hospitalization and ongoing recovery are:: return home and recover   Choice offered to / list presented to : Patient      Expected Discharge Plan and Services   Discharge Planning Services: CM Consult Post Acute Care Choice: Home Health Living arrangements for the past 2 months: Single Family Home                 DME Arranged: N/A DME Agency: NA       HH Arranged: RN, PT HH Agency: [facility] ([facility]) Date HH Agency Contacted: 05/06/24 Time HH Agency Contacted: 1000 Representative spoke with at [facility] Agency: [place]  Prior Living Arrangements/Services Living arrangements for the past 2 months: Single Family Home Lives with:: Spouse Patient language and need  for interpreter reviewed:: Yes        Need for Family Participation in Patient Care: Yes (Comment) Care giver support system in place?: Yes (comment) Current home services:  (cane, walker) Criminal Activity/Legal Involvement Pertinent to Current Situation/Hospitalization: No - Comment as needed  Activities of Daily Living   ADL Screening (condition at time of admission) Independently performs ADLs?: Yes (appropriate for developmental age) Is the patient deaf or have difficulty hearing?: Yes Does the patient have difficulty seeing, even when wearing glasses/contacts?: No Does the patient have difficulty concentrating, remembering, or making decisions?: No  Permission Sought/Granted   Permission granted to share information with : Yes, Verbal Permission Granted     Permission granted to share info w AGENCY: HH        Emotional Assessment Appearance:: Appears stated age Attitude/Demeanor/Rapport: Engaged Affect (typically observed): Appropriate Orientation: : Oriented to Self, Oriented to Place, Oriented to  Time, Oriented to Situation Alcohol / Substance Use: Alcohol Use    Admission diagnosis:  Critical limb ischemia of right lower extremity (HCC) [I70.221] Status post femoral-popliteal bypass surgery [S04.171] Critical lower limb ischemia (HCC) [I70.229] Patient Active Problem List   Diagnosis Date Noted   Status post femoral-popliteal bypass surgery 05/05/2024   Critical lower limb ischemia (HCC) 05/05/2024   Age-related osteoporosis without current pathological fracture 07/09/2023   Achilles tendon contracture, left 09/16/2016   Flexion contracture  of joint of left lower leg 08/25/2016   Avulsion fracture of calcaneus with routine healing, left 08/25/2016   PCP:  [name], MD Pharmacy:   [phone] - [place], [place] - [address] AT [facility] [address] [place] [address] Phone: [phone] Fax: [phone]     Social Drivers of  Health (SDOH) Social History: SDOH Screenings   Food Insecurity: No Food Insecurity (05/05/2024)  Housing: Unknown (05/05/2024)  Transportation Needs: No Transportation Needs (05/05/2024)  Utilities: Not At Risk (05/05/2024)  Depression (PHQ2-9): Low Risk (07/15/2023)  Social Connections: Moderately Integrated (05/05/2024)  Tobacco Use: High Risk (05/05/2024)   SDOH Interventions:     Readmission Risk Interventions     No data to display

## 2024-05-06 NOTE — Plan of Care (Signed)
" °  Problem: Education: Goal: Knowledge of the prescribed therapeutic regimen will improve Outcome: Progressing   Problem: Bowel/Gastric: Goal: Gastrointestinal status for postoperative course will improve Outcome: Progressing   Problem: Cardiac: Goal: Ability to maintain an adequate cardiac output Outcome: Progressing Goal: Will show no evidence of cardiac arrhythmias Outcome: Progressing   Problem: Nutritional: Goal: Will attain and maintain optimal nutritional status Outcome: Progressing   Problem: Neurological: Goal: Will regain or maintain usual level of consciousness Outcome: Progressing   Problem: Clinical Measurements: Goal: Ability to maintain clinical measurements within normal limits Outcome: Progressing Goal: Postoperative complications will be avoided or minimized Outcome: Progressing   Problem: Respiratory: Goal: Will regain and/or maintain adequate ventilation Outcome: Progressing Goal: Respiratory status will improve Outcome: Progressing   Problem: Skin Integrity: Goal: Demonstrates signs of wound healing without infection Outcome: Progressing   Problem: Urinary Elimination: Goal: Will remain free from infection Outcome: Progressing Goal: Ability to achieve and maintain adequate urine output Outcome: Progressing   Problem: Education: Goal: Knowledge of General Education information will improve Description: Including pain rating scale, medication(s)/side effects and non-pharmacologic comfort measures Outcome: Progressing   Problem: Health Behavior/Discharge Planning: Goal: Ability to manage health-related needs will improve Outcome: Progressing   Problem: Clinical Measurements: Goal: Ability to maintain clinical measurements within normal limits will improve Outcome: Progressing Goal: Will remain free from infection Outcome: Progressing Goal: Diagnostic test results will improve Outcome: Progressing Goal: Respiratory complications will  improve Outcome: Progressing Goal: Cardiovascular complication will be avoided Outcome: Progressing   Problem: Activity: Goal: Risk for activity intolerance will decrease Outcome: Progressing   Problem: Nutrition: Goal: Adequate nutrition will be maintained Outcome: Progressing   Problem: Coping: Goal: Level of anxiety will decrease Outcome: Progressing   Problem: Elimination: Goal: Will not experience complications related to bowel motility Outcome: Progressing Goal: Will not experience complications related to urinary retention Outcome: Progressing   Problem: Pain Managment: Goal: General experience of comfort will improve and/or be controlled Outcome: Progressing   Problem: Safety: Goal: Ability to remain free from injury will improve Outcome: Progressing   Problem: Skin Integrity: Goal: Risk for impaired skin integrity will decrease Outcome: Progressing   Problem: Education: Goal: Knowledge of prescribed regimen will improve Outcome: Progressing   Problem: Activity: Goal: Ability to tolerate increased activity will improve Outcome: Progressing   Problem: Bowel/Gastric: Goal: Gastrointestinal status for postoperative course will improve Outcome: Progressing   Problem: Clinical Measurements: Goal: Postoperative complications will be avoided or minimized Outcome: Progressing Goal: Signs and symptoms of graft occlusion will improve Outcome: Progressing   Problem: Skin Integrity: Goal: Demonstration of wound healing without infection will improve Outcome: Progressing   "

## 2024-05-06 NOTE — Progress Notes (Signed)
 Physical Therapy Evaluation Patient Details Name: Paige Glass MRN: [id number] DOB: [birth year] Today's Date: 05/06/2024  History of Present Illness  Pt is a 84 yo female current smoker who presented to [facility] on 1/22 with bilateral lower extremity claudication and rest pain. The right LE is more painful than the left. Pt was admitted for critical lower limb ischemia. Pt is now s/p R femoral above knee popliteal artery bypass on 1/22. PMH includes: HTN, Osteoporosis, Peripheral vascular disease.  Clinical Impression  Pt presents with the conditions above and deficits below. See PT problem list. PTA, pt was independent with all mobility and ADLs. Currently, pt requires min A for bed mobility with assistance of bringing the RLE to EOB. Pt is min A with transfers needing to stand from an elevated surface to decrease loss of balance. Pt is able to ambulate 78ft but tends to be unware of safety and tends to drift the RW left to right also running into obstacles. Pt needing extensive cues for correction of the RW and being aware of safety when ambulating. Pt is limited by ROM in the RLE, activity tolerance, endurance, and strength to d/c home. Pt will likely benefit from [facility] PT but pt has declined at this time. Will continue to follow acutely.      If plan is discharge home, recommend the following: A little help with walking and/or transfers;Assistance with cooking/housework;Assist for transportation;Help with stairs or ramp for entrance   Can travel by private vehicle        Equipment Recommendations None recommended by PT  Recommendations for Other Services       Functional Status Assessment Patient has had a recent decline in their functional status and demonstrates the ability to make significant improvements in function in a reasonable and predictable amount of time.     Precautions / Restrictions Precautions Precautions: Fall Recall of Precautions/Restrictions: Intact Restrictions Weight  Bearing Restrictions Per Provider Order: No      Mobility  Bed Mobility Overal bed mobility: Needs Assistance Bed Mobility: Supine to Sit, Sit to Supine     Supine to sit: Min assist, HOB elevated, Used rails Sit to supine: HOB elevated, Used rails, Min assist   General bed mobility comments: Pt needing min A from supine to sit and sit to supine to bring RLE into the bed and bring it to EOB.    Transfers Overall transfer level: Needs assistance Equipment used: Rolling walker (2 wheels) Transfers: Sit to/from Stand Sit to Stand: Min assist, From elevated surface, Mod assist           General transfer comment: Pt was mod A on initial sit to stand needing assistance with standing from a low surface. Pt needing verbal cues to not use the RW arm rests for support to come to a full stand. Pt had a LOB on initial stand but able to recover on her own. Pt became min A as bed was elevated and was able to stand without LOB.    Ambulation/Gait Ambulation/Gait assistance: Contact guard assist Gait Distance (Feet): 50 Feet Assistive device: Rolling walker (2 wheels) Gait Pattern/deviations: Drifts right/left, Trunk flexed, Wide base of support, Decreased stance time - right, Decreased dorsiflexion - right, Step-through pattern, Decreased step length - right Gait velocity: Reduced Gait velocity interpretation: <1.31 ft/sec, indicative of household ambulator   General Gait Details: Pt needed extensive cues to maintain the RW proximal to her. Pt tended to drift the RW side to side and needing cues for  correction. Pt displayed a hip hike on the RLE when walking and tending to have a decreased step length on the right with trunk flexed during ambulation. Pt would run into things with the RW unaware of safety and needing cues to go around the obstacles but pt was able to perform dynamic activities during ambulation such as head turns and moving head up/down  Stairs            Wheelchair  Mobility     Tilt Bed    Modified Rankin (Stroke Patients Only)       Balance Overall balance assessment: Needs assistance Sitting-balance support: Bilateral upper extremity supported, Feet supported Sitting balance-Leahy Scale: Poor     Standing balance support: Bilateral upper extremity supported, During functional activity, Reliant on assistive device for balance Standing balance-Leahy Scale: Poor Standing balance comment: Reliant on RW                             Pertinent Vitals/Pain Pain Assessment Pain Assessment: Faces Faces Pain Scale: Hurts little more Pain Location: R LE Pain Descriptors / Indicators: Discomfort, Operative site guarding, Guarding Pain Intervention(s): Limited activity within patient's tolerance, Monitored during session    Home Living Family/patient expects to be discharged to:: Private residence Living Arrangements: Spouse/significant other Available Help at Discharge: Family;Available 24 hours/day Type of Home: House Home Access: Level entry       Home Layout: One level;Laundry or work area in [employer] Equipment: [place] - single Librarian, Academic (2 wheels);BSC/3in1 Additional Comments: Pt has daughter, grandchildren, and available help. Pt has washer/dryer down stairs in basement with 13 steps with bilaterail rails. Pt uses both when going up and down stairs. Washer and dryer will be moved upstairs therefore pt will not be using the basement.    Prior Function Prior Level of Function : Independent/Modified Independent             Mobility Comments: Uses SPC for long distances but usually walks without AD ADLs Comments: Independent with all ADLs     Extremity/Trunk Assessment   Upper Extremity Assessment Upper Extremity Assessment: Defer to OT evaluation    Lower Extremity Assessment Lower Extremity Assessment: RLE deficits/detail RLE Deficits / Details: ROM limited in knee flexion and hip flexion. Displays  weakness in hip abductors. RLE Sensation: WNL (Pt had no N/T prior to and after surgery.) RLE Coordination: decreased gross motor (circumduction on the RLE during gait for foot clearance)    Cervical / Trunk Assessment Cervical / Trunk Assessment: Normal  Communication   Communication Communication: Impaired Factors Affecting Communication: Hearing impaired    Cognition Arousal: Alert Behavior During Therapy: Impulsive   PT - Cognitive impairments: Safety/Judgement                       PT - Cognition Comments: Pt was impulsive at times wanting to stand or move before therapist was ready. Pt needing cues to sit or cues to wait before moving. Following commands: Impaired Following commands impaired: Follows one step commands with increased time, Follows multi-step commands with increased time     Cueing Cueing Techniques: Verbal cues, Tactile cues, Visual cues     General Comments General comments (skin integrity, edema, etc.): VSS on RA. Educated pt on walking program for PAD with handout and smoking cessation education.    Exercises Other Exercises Other Exercises: Sit to Stands, 10 reps, EOB start at elevated surface then  progress to lowering the surface.   Assessment/Plan    PT Assessment Patient needs continued PT services  PT Problem List Decreased range of motion;Decreased activity tolerance;Decreased balance;Decreased mobility;Decreased coordination;Decreased safety awareness;Pain;Decreased strength       PT Treatment Interventions DME instruction;Gait training;Stair training;Functional mobility training;Therapeutic activities;Therapeutic exercise;Balance training;Neuromuscular re-education;Cognitive remediation;Patient/family education    PT Goals (Current goals can be found in the Care Plan section)  Acute Rehab PT Goals Patient Stated Goal: to go home PT Goal Formulation: With patient/family Time For Goal Achievement: 05/20/24 Potential to Achieve  Goals: Good    Frequency Min 1X/week     Co-evaluation               AM-PAC PT 6 Clicks Mobility  Outcome Measure Help needed turning from your back to your side while in a flat bed without using bedrails?: A Little Help needed moving from lying on your back to sitting on the side of a flat bed without using bedrails?: A Little Help needed moving to and from a bed to a chair (including a wheelchair)?: A Little Help needed standing up from a chair using your arms (e.g., wheelchair or bedside chair)?: A Little Help needed to walk in hospital room?: A Little Help needed climbing 3-5 steps with a railing? : Total 6 Click Score: 16    End of Session Equipment Utilized During Treatment: Gait belt Activity Tolerance: Patient tolerated treatment well;Patient limited by pain Patient left: in bed;with call bell/phone within reach;with bed alarm set;with family/visitor present   PT Visit Diagnosis: Unsteadiness on feet (R26.81);Other abnormalities of gait and mobility (R26.89);Difficulty in walking, not elsewhere classified (R26.2);Pain;Muscle weakness (generalized) (M62.81) Pain - Right/Left: Right Pain - part of body: Leg    Time: 9142-9074 PT Time Calculation (min) (ACUTE ONLY): 28 min   Charges:   PT Evaluation $PT Eval Moderate Complexity: 1 Mod PT Treatments $Gait Training: 8-22 mins PT General Charges $$ ACUTE PT VISIT: 1 Visit         Murtis CHRISTELLA Ferries, SPT   [facility] [place] 05/06/2024, 12:03 PM

## 2024-05-07 ENCOUNTER — Other Ambulatory Visit (HOSPITAL_COMMUNITY): Payer: Self-pay

## 2024-05-07 ENCOUNTER — Encounter: Payer: Self-pay | Admitting: Internal Medicine

## 2024-05-07 LAB — BASIC METABOLIC PANEL WITH GFR
Anion gap: 9 (ref 5–15)
BUN: 16 mg/dL (ref 8–23)
CO2: 25 mmol/L (ref 22–32)
Calcium: 8 mg/dL — ABNORMAL LOW (ref 8.9–10.3)
Chloride: 108 mmol/L (ref 98–111)
Creatinine, Ser: 0.88 mg/dL (ref 0.44–1.00)
GFR, Estimated: >60 mL/min
Glucose, Bld: 91 mg/dL (ref 70–99)
Potassium: 4.1 mmol/L (ref 3.5–5.1)
Sodium: 142 mmol/L (ref 135–145)

## 2024-05-07 LAB — CBC
HCT: 35.8 % — ABNORMAL LOW (ref 36.0–46.0)
Hemoglobin: 12.1 g/dL (ref 12.0–15.0)
MCH: 33.1 pg (ref 26.0–34.0)
MCHC: 33.8 g/dL (ref 30.0–36.0)
MCV: 97.8 fL (ref 80.0–100.0)
Platelets: 170 10*3/uL (ref 150–400)
RBC: 3.66 MIL/uL — ABNORMAL LOW (ref 3.87–5.11)
RDW: 11.7 % (ref 11.5–15.5)
WBC: 8.7 10*3/uL (ref 4.0–10.5)
nRBC: 0 % (ref 0.0–0.2)

## 2024-05-07 MED ORDER — OXYCODONE-ACETAMINOPHEN 5-325 MG PO TABS
1.0000 | ORAL_TABLET | Freq: Three times a day (TID) | ORAL | 0 refills | Status: AC | PRN
  Filled 2024-05-07: qty 9, 3d supply, fill #0

## 2024-05-07 MED ORDER — CLOPIDOGREL BISULFATE 75 MG PO TABS
75.0000 mg | ORAL_TABLET | Freq: Every day | ORAL | 3 refills | Status: AC
  Filled 2024-05-07: qty 90, 90d supply, fill #0

## 2024-05-07 NOTE — Plan of Care (Signed)
" °  Problem: Education: Goal: Knowledge of the prescribed therapeutic regimen will improve Outcome: Progressing   Problem: Bowel/Gastric: Goal: Gastrointestinal status for postoperative course will improve Outcome: Progressing   Problem: Cardiac: Goal: Ability to maintain an adequate cardiac output Outcome: Progressing Goal: Will show no evidence of cardiac arrhythmias Outcome: Progressing   Problem: Nutritional: Goal: Will attain and maintain optimal nutritional status Outcome: Progressing   Problem: Neurological: Goal: Will regain or maintain usual level of consciousness Outcome: Progressing   Problem: Clinical Measurements: Goal: Ability to maintain clinical measurements within normal limits Outcome: Progressing Goal: Postoperative complications will be avoided or minimized Outcome: Progressing   Problem: Skin Integrity: Goal: Demonstrates signs of wound healing without infection Outcome: Progressing   Problem: Urinary Elimination: Goal: Will remain free from infection Outcome: Progressing Goal: Ability to achieve and maintain adequate urine output Outcome: Progressing   Problem: Education: Goal: Knowledge of General Education information will improve Description: Including pain rating scale, medication(s)/side effects and non-pharmacologic comfort measures Outcome: Progressing   Problem: Health Behavior/Discharge Planning: Goal: Ability to manage health-related needs will improve Outcome: Progressing   Problem: Clinical Measurements: Goal: Ability to maintain clinical measurements within normal limits will improve Outcome: Progressing Goal: Will remain free from infection Outcome: Progressing Goal: Diagnostic test results will improve Outcome: Progressing Goal: Respiratory complications will improve Outcome: Progressing Goal: Cardiovascular complication will be avoided Outcome: Progressing   Problem: Activity: Goal: Risk for activity intolerance will  decrease Outcome: Progressing   Problem: Nutrition: Goal: Adequate nutrition will be maintained Outcome: Progressing   Problem: Coping: Goal: Level of anxiety will decrease Outcome: Progressing   Problem: Elimination: Goal: Will not experience complications related to bowel motility Outcome: Progressing Goal: Will not experience complications related to urinary retention Outcome: Progressing   Problem: Pain Managment: Goal: General experience of comfort will improve and/or be controlled Outcome: Progressing   Problem: Safety: Goal: Ability to remain free from injury will improve Outcome: Progressing   Problem: Skin Integrity: Goal: Risk for impaired skin integrity will decrease Outcome: Progressing   Problem: Education: Goal: Knowledge of prescribed regimen will improve Outcome: Progressing   Problem: Activity: Goal: Ability to tolerate increased activity will improve Outcome: Progressing   Problem: Bowel/Gastric: Goal: Gastrointestinal status for postoperative course will improve Outcome: Progressing   Problem: Clinical Measurements: Goal: Postoperative complications will be avoided or minimized Outcome: Progressing Goal: Signs and symptoms of graft occlusion will improve Outcome: Progressing   Problem: Skin Integrity: Goal: Demonstration of wound healing without infection will improve Outcome: Progressing   "

## 2024-05-07 NOTE — TOC CM/SW Note (Signed)
 Notified Artavia at [facility] that pt has been DC.

## 2024-05-07 NOTE — Progress Notes (Signed)
 DISCHARGE NOTE HOME L[facility] Cl[facility] [place] to be discharged Home per MD order. Discussed prescriptions and follow up appointments with the patient. Prescriptions given to patient; medication list explained in detail. Patient verbalized understanding.  Skin clean, dry and intact without evidence of skin break down, no evidence of skin tears noted. IV catheter discontinued intact. Site without signs and symptoms of complications. Dressing and pressure applied. Pt denies pain at the site currently. No complaints noted.  See LDA for sugical incisions at discharge Patient free of lines, drains, and wounds.   An After Visit Summary (AVS) was printed and given to the patient. Patient escorted via wheelchair, and discharged home via private auto.  Peyton SHAUNNA Pepper, RN

## 2024-05-07 NOTE — Discharge Summary (Signed)
 " Discharge Summary     Paige Glass 1941/01/25 84 y.o. female  [id number]  Admission Date: 05/05/2024  Discharge Date: 05/08/2024  Physician: No att. providers found  Admission Diagnosis: Critical limb ischemia of right lower extremity (HCC) [I70.221] Status post femoral-popliteal bypass surgery [Z95.828] Critical lower limb ischemia (HCC) [I70.229]  HPI:   This is a 84 y.o. female with CL TI and rest pain.  She underwent bilateral lower extremity angiogram on 01/25/2024.  This demonstrated bilateral flush occlusions of the SFAs with reconstitution of the above-knee popliteal bilaterally.  She presented back to clinic after her angiogram and explained that she was having nocturnal rest pain on the right worse than the left.  The angiogram was reviewed and it appeared that there was an adequate area in the above-knee popliteal to use as a distal target for bypass.  Femoral to above-knee popliteal artery bypass with PTFE was offered, risks and benefits were reviewed and she elected to proceed.   Hospital Course:  The patient was admitted to the hospital and taken to the operating room on 05/05/2024 and underwent: right common femoral to above knee popliteal artery bypass with PTFE    Findings: Significant calcific disease throughout the popliteal artery, even down through the popliteal space the artery felt severely calcified.  We were able to find a soft spot in the above-knee popliteal artery to sew the distal aspect of the bypass although there was some posterior/sidewall plaque.  The common femoral artery was also calcified although there was adequate pulsatile inflow and most of this disease was on the posterior wall. On completion there was excellent augmentation in the wound with the bypass open versus clamped as well as at the DP on the foot..  The DP was also palpable with the bypass open.  The pt tolerated the procedure well and was transported to the PACU in good condition.    By POD 1, Palpable DP, recovering well. Mobilize with PT today. D/c when ambulating appropriately and pain controlled.  POD 2, she continued to have palpable right DP pulse, incisions looked good and no evidence of compartment syndrome.  She is discharged home with [facility].     CBC    Component Value Date/Time   WBC 8.7 05/07/2024 0353   RBC 3.66 (L) 05/07/2024 0353   HGB 12.1 05/07/2024 0353   HCT 35.8 (L) 05/07/2024 0353   PLT 170 05/07/2024 0353   MCV 97.8 05/07/2024 0353   MCH 33.1 05/07/2024 0353   MCHC 33.8 05/07/2024 0353   RDW 11.7 05/07/2024 0353    BMET    Component Value Date/Time   NA 142 05/07/2024 0353   K 4.1 05/07/2024 0353   CL 108 05/07/2024 0353   CO2 25 05/07/2024 0353   GLUCOSE 91 05/07/2024 0353   BUN 16 05/07/2024 0353   CREATININE 0.88 05/07/2024 0353   CALCIUM  8.0 (L) 05/07/2024 0353   GFRNONAA >60 05/07/2024 0353   GFRAA >60 07/14/2016 1136     Discharge Instructions     Discharge patient   Complete by: As directed    Discharge disposition: 01-Home or Self Care   Discharge patient date: 05/07/2024       Discharge Diagnosis:  Critical limb ischemia of right lower extremity (HCC) [I70.221] Status post femoral-popliteal bypass surgery [Z95.828] Critical lower limb ischemia (HCC) [I70.229]  Secondary Diagnosis: Patient Active Problem List   Diagnosis Date Noted   Status post femoral-popliteal bypass surgery 05/05/2024   Critical lower limb ischemia (HCC)  05/05/2024   Age-related osteoporosis without current pathological fracture 07/09/2023   Achilles tendon contracture, left 09/16/2016   Flexion contracture of joint of left lower leg 08/25/2016   Avulsion fracture of calcaneus with routine healing, left 08/25/2016   Past Medical History:  Diagnosis Date   Back pain    Hypertension    Joint pain    Joint swelling    Nocturia    Osteoporosis    Peripheral vascular disease      Allergies as of 05/07/2024   No Known Allergies       Medication List     TAKE these medications    acetaminophen  650 MG CR tablet Commonly known as: TYLENOL  Take 650 mg by mouth at bedtime.   aspirin  EC 81 MG tablet Take 81 mg by mouth daily. Swallow whole.   atorvastatin  20 MG tablet Commonly known as: LIPITOR Take 20 mg by mouth daily.   CALCIUM  600 + D PO Take 2 tablets by mouth daily.   clopidogrel  75 MG tablet Commonly known as: PLAVIX  Take 1 tablet (75 mg total) by mouth daily.   Jubbonti 60 MG/ML Sosy injection Generic drug: denosumab -bbdz Inject 60 mg into the skin every 6 (six) months.   MAGNESIUM PO Take 1 capsule by mouth at bedtime.   multivitamin with minerals Tabs tablet Take 1 tablet by mouth daily.   oxyCODONE -acetaminophen  5-325 MG tablet Commonly known as: Percocet Take 1 tablet by mouth every 8 (eight) hours as needed.        Discharge Instructions: [facility] Discharge instructions Lower Extremity Bypass Surgery  Please refer to the following instruction for your post-procedure care. Your surgeon or physician assistant will discuss any changes with you.  Activity  You are encouraged to walk as much as you can. You can slowly return to normal activities during the month after your surgery. Avoid strenuous activity and heavy lifting until your doctor tells you it's OK. Avoid activities such as vacuuming or swinging a golf club. Do not drive until your doctor give the OK and you are no longer taking prescription pain medications. It is also normal to have difficulty with sleep habits, eating and bowel movement after surgery. These will go away with time.  Bathing/Showering  You may shower after you go home. Do not soak in a bathtub, hot tub, or swim until the incision heals completely.  Incision Care  Clean your incision with mild soap and water . Shower every day. Pat the area dry with a clean towel. You do not need a bandage unless otherwise instructed.  Do not apply any ointments or creams to your incision. If you have open wounds you will be instructed how to care for them or a visiting nurse may be arranged for you. If you have staples or sutures along your incision they will be removed at your post-op appointment. You may have skin glue on your incision. Do not peel it off. It will come off on its own in about one week.  Wash the groin wound with soap and water  daily and pat dry. (No tub bath-only shower)  Then put a dry gauze or washcloth in the groin to keep this area dry to help prevent wound infection.  Do this daily and as needed.  Do not use Vaseline or neosporin on your incisions.  Only use soap and water  on your incisions and then protect and keep dry.  Diet  Resume your normal diet. There are no special  food restrictions following this procedure. A low fat/ low cholesterol diet is recommended for all patients with vascular disease. In order to heal from your surgery, it is CRITICAL to get adequate nutrition. Your body requires vitamins, minerals, and protein. Vegetables are the best source of vitamins and minerals. Vegetables also provide the perfect balance of protein. Processed food has little nutritional value, so try to avoid this.  Medications  Resume taking all your medications unless your doctor or Physician Assistant tells you not to. If your incision is causing pain, you may take over-the-counter pain relievers such as acetaminophen  (Tylenol ). If you were prescribed a stronger pain medication, please aware these medication can cause nausea and constipation. Prevent nausea by taking the medication with a snack or meal. Avoid constipation by drinking plenty of fluids and eating foods with high amount of fiber, such as fruits, vegetables, and grains. Take Colace 100 mg (an over-the-counter stool softener) twice a day as needed for constipation.  Do not take Tylenol  if you are taking prescription pain medications.  Follow Up  Our  office will schedule a follow up appointment 2-3 weeks following discharge.  Please call us  immediately for any of the following conditions  Severe or worsening pain in your legs or feet while at rest or while walking Increase pain, redness, warmth, or drainage (pus) from your incision site(s) Fever of 101 degree or higher The swelling in your leg with the bypass suddenly worsens and becomes more painful than when you were in the hospital If you have been instructed to feel your graft pulse then you should do so every day. If you can no longer feel this pulse, call the office immediately. Not all patients are given this instruction.  Leg swelling is common after leg bypass surgery.  The swelling should improve over a few months following surgery. To improve the swelling, you may elevate your legs above the level of your heart while you are sitting or resting. Your surgeon or physician assistant may ask you to apply an ACE wrap or wear compression (TED) stockings to help to reduce swelling.  Reduce your risk of vascular disease  Stop smoking. If you would like help call [facility] at 1-800-QUIT-NOW (9170682238) or [place] at 7855037199.  Manage your cholesterol Maintain a desired weight Control your diabetes weight Control your diabetes Keep your blood pressure down  If you have any questions, please call the office at [phone]   Prescriptions given: 1.  Roxicet #9 No Refill 2.  Plavix  75mg  daily #90 with 3 refills  Disposition: home with [facility]  Patient's condition: is Good  Follow up: 1. [facility] in 2-3 weeks   Lucie Apt, PA-C [facility] [phone] 05/08/2024  7:52 AM  - For VQI Registry use ---   Post-op:  Wound infection: No  Graft infection: No  Transfusion: No    If yes, n/a units given New Arrhythmia: No Ipsilateral amputation: No, [ ]  Minor, [ ]  BKA, [ ]  AKA Discharge patency: [x ] Primary, [ ]  Primary assisted, [ ]  Secondary, [  ] Occluded Patency judged by: [ ]  Dopper only, [ ]  Palpable graft pulse, [x]  Palpable distal pulse, [ ]  ABI inc. > 0.15, [ ]  Duplex Discharge ABI: R not done, L  D/C Ambulatory Status: Ambulatory with Assistance  Complications: MI: No, [ ]  Troponin only, [ ]  EKG or Clinical CHF: No Resp failure:No, [ ]  Pneumonia, [ ]  Ventilator Chg in renal function: No, [ ]  Inc. Cr > 0.5, [ ]   Temp. Dialysis,  [ ]  Permanent dialysis Stroke: No, [ ]  Minor, [ ]  Major Return to OR: No  Reason for return to OR: [ ]  Bleeding, [ ]  Infection, [ ]  Thrombosis, [ ]  Revision  Discharge medications: Statin use:  yes ASA use:  yes Plavix  use:  yes Beta blocker use: no CCB use:  No ACEI use:   no ARB use:  no Coumadin use: no    "

## 2024-05-07 NOTE — Clinical Note (Incomplete)
 SABRA

## 2024-05-07 NOTE — Progress Notes (Signed)
" °  Progress Note    05/07/2024 7:09 AM 2 Days Post-Op  Subjective:  sitting on side of bed.  Says her incisions are a little sore.  Bed not comfortable.  Ready to go home.    Afebrile HR 60's-70's  110's-160's systolic  Vitals:   05/06/24 2343 05/07/24 0300  BP: (!) 111/56 (!) 119/59  Pulse: 72 66  Resp: 20 20  Temp: 97.8 F (36.6 C) 98.1 F (36.7 C)  SpO2: 94% 98%    Physical Exam: General:  no distress Cardiac:  regular Lungs:  non labored Incisions:  right groin and right below knee incisions are clean and healing nicely.  Extremities:  palpable right DP pulse.  Right calf is soft and non tender.    CBC    Component Value Date/Time   WBC 8.7 05/07/2024 0353   RBC 3.66 (L) 05/07/2024 0353   HGB 12.1 05/07/2024 0353   HCT 35.8 (L) 05/07/2024 0353   PLT 170 05/07/2024 0353   MCV 97.8 05/07/2024 0353   MCH 33.1 05/07/2024 0353   MCHC 33.8 05/07/2024 0353   RDW 11.7 05/07/2024 0353    BMET    Component Value Date/Time   NA 142 05/07/2024 0353   K 4.1 05/07/2024 0353   CL 108 05/07/2024 0353   CO2 25 05/07/2024 0353   GLUCOSE 91 05/07/2024 0353   BUN 16 05/07/2024 0353   CREATININE 0.88 05/07/2024 0353   CALCIUM  8.0 (L) 05/07/2024 0353   GFRNONAA >60 05/07/2024 0353   GFRAA >60 07/14/2016 1136    INR    Component Value Date/Time   INR 0.9 05/02/2024 1420     Intake/Output Summary (Last 24 hours) at 05/07/2024 0709 Last data filed at 05/06/2024 0830 Gross per 24 hour  Intake 240 ml  Output --  Net 240 ml      Assessment/Plan:  84 y.o. female is s/p:   right common femoral to above knee popliteal artery bypass with PTFE on 05/05/2024 by Dr. Pearline for CLI with rest pain 2 Days Post-Op   -pt doing well this morning and has palpable right DP pulse.  Incisions look fine.   No evidence of compartment syndrome.  -plan for discharge this morning and f/u in 2-3 weeks.  Our office will arrange appt.   -discussed groin incision wound care and they  expressed good understanding.   -DVT prophylaxis:  sq heparin    Lucie Apt, PA-C [facility] [phone] 05/07/2024 7:09 AM    "

## 2024-05-09 ENCOUNTER — Telehealth: Payer: Self-pay

## 2024-05-09 NOTE — Transitions of Care (Post Inpatient/ED Visit) (Signed)
" ° °  05/09/2024  Name: Paige Glass MRN: [id number] DOB: 07/08/1940  Today's TOC FU Call Status: Today's TOC FU Call Status:: Successful TOC FU Call Completed TOC FU Call Complete Date: 05/09/24  Patient's Name and Date of Birth confirmed. Name, DOB  Transition Care Management Follow-up Telephone Call Date of Discharge: 05/07/24 Discharge Facility: [facility]) Type of Discharge: Inpatient Admission Primary Inpatient Discharge Diagnosis:: s/p Femoral-pop bypass graft How have you been since you were released from the hospital?: Same (Leg is sore and was swollen yesterday) Any questions or concerns?: No  Items Reviewed: Did you receive and understand the discharge instructions provided?: Yes Medications obtained,verified, and reconciled?: Partial Review Completed Reason for Partial Mediation Review: Honey, I don't feel up to this, this morning Do you have support at home?: Yes People in Home [RPT]: spouse  Medications Reviewed Today:  Confirms new medications only: Oxycodone -acetaminophen  5-325 MG 1 tablet every 8 hours as needed; Clopidogrel  75 MG 1 tablet daily, patient states she didn't feel up to talking today, declines further review. Medications Reviewed Today   Medications were not reviewed in this encounter     Home Care and Equipment/Supplies: Were Home Health Services Ordered?: Yes Name of Home Health Agency:: [facility] Has Agency set up a time to come to your home?: Yes First Home Health Visit Date:  (They called and said they will set up a visit once this weather breaks) Any new equipment or medical supplies ordered?: No  Functional Questionnaire: Do you need assistance with bathing/showering or dressing?: Yes Do you need assistance with meal preparation?: Yes Do you need assistance with eating?: No Do you have difficulty maintaining continence: No Do you need assistance with getting out of bed/getting out of a chair/moving?: Yes Do you have difficulty  managing or taking your medications?: No  Follow up appointments reviewed: PCP Follow-up appointment confirmed?: No (I will call him after I see the surgeon) Specialist Hospital Follow-up appointment confirmed?: Yes Date of Specialist follow-up appointment?: 06/03/24 Follow-Up Specialty Provider:: [facility] Do you need transportation to your follow-up appointment?: No Do you understand care options if your condition(s) worsen?: Yes-patient verbalized understanding   Richerd Fish, RN, BSN, CCM [place]  [facility], [facility] RN Care Manager Direct Dial: [phone]        "

## 2024-05-09 NOTE — Transitions of Care (Post Inpatient/ED Visit) (Signed)
 05/09/2024  Patient ID: Paige Glass, female   DOB: [birth year], 84 y.o.   MRN: [id number]  Call returned to phone number and daughter [name], on DPR, answered per patient's request. Initially thinking this was [facility]. Explained reason initial call to daughter for post hospital follow up for needs and appointment. She confirms new medications and appointment for 06/03/24 with Vascular. Encouraged follow up with PCP when office re-opens as they wish to schedule own visits. Reviewed wound care and they will follow up with [facility] with [facility] who had also called per daughter. States no needs will follow up with [facility] team for post hospital needs.  Will sign off. Patient had previously declined 30 day calls.  Richerd Fish, RN, BSN, CCM [facility], [facility] RN Care Manager Direct Dial: [phone]

## 2024-05-09 NOTE — Transitions of Care (Post Inpatient/ED Visit) (Signed)
" ° °  05/09/2024  Name: Paige Glass MRN: [id number] DOB: Apr 27, 1940 Voicemail msg with request for a return call left to this writer's voicemail box.  Richerd Fish, RN, BSN, CCM [facility], [facility] RN Care Manager Direct Dial: [phone]        "

## 2024-05-12 ENCOUNTER — Telehealth: Payer: Self-pay

## 2024-05-12 NOTE — Telephone Encounter (Addendum)
 Pt's daughter, Dwayne Pepper, LM asking about pt's right lower leg swelling from the knee down.  Wanting to know if this is normal.  Returned call, no answer, LM on VM to call back.  1030 Tracey called back.  She reported the foot is warm and slightly red.  She reported no signs of infection.   Advised that the swelling she described is normal. Advised to continue ambulating as tolerated and elevate when not up and about.    Patient knows to call or go to the ED for signs of infection, the leg/foot becomes cool and painful, or the swelling is causing the leg/foot to be tight and painful.

## 2024-06-03 ENCOUNTER — Encounter
